# Patient Record
Sex: Female | Born: 1985 | Race: White | Hispanic: No | Marital: Single | Smoking: Never smoker
Health system: Southern US, Community
[De-identification: ages and names within clinical notes are randomized; demographics above are authoritative.]

## PROBLEM LIST (undated history)

## (undated) DIAGNOSIS — F329 Major depressive disorder, single episode, unspecified: Secondary | ICD-10-CM

## (undated) DIAGNOSIS — F419 Anxiety disorder, unspecified: Secondary | ICD-10-CM

## (undated) DIAGNOSIS — H6690 Otitis media, unspecified, unspecified ear: Secondary | ICD-10-CM

## (undated) DIAGNOSIS — F32A Depression, unspecified: Secondary | ICD-10-CM

## (undated) DIAGNOSIS — I82409 Acute embolism and thrombosis of unspecified deep veins of unspecified lower extremity: Secondary | ICD-10-CM

## (undated) HISTORY — DX: Major depressive disorder, single episode, unspecified: F32.9

## (undated) HISTORY — PX: VEIN SURGERY: SHX48

## (undated) HISTORY — PX: CHOLECYSTECTOMY: SHX55

## (undated) HISTORY — DX: Anxiety disorder, unspecified: F41.9

## (undated) HISTORY — DX: Depression, unspecified: F32.A

---

## 2012-10-18 DIAGNOSIS — H8109 Meniere's disease, unspecified ear: Secondary | ICD-10-CM | POA: Insufficient documentation

## 2012-10-18 DIAGNOSIS — G43909 Migraine, unspecified, not intractable, without status migrainosus: Secondary | ICD-10-CM | POA: Insufficient documentation

## 2012-12-29 DIAGNOSIS — R202 Paresthesia of skin: Secondary | ICD-10-CM | POA: Insufficient documentation

## 2013-07-01 DIAGNOSIS — Z86718 Personal history of other venous thrombosis and embolism: Secondary | ICD-10-CM | POA: Insufficient documentation

## 2013-08-24 DIAGNOSIS — M76829 Posterior tibial tendinitis, unspecified leg: Secondary | ICD-10-CM | POA: Insufficient documentation

## 2014-03-14 DIAGNOSIS — R109 Unspecified abdominal pain: Secondary | ICD-10-CM | POA: Insufficient documentation

## 2014-03-14 DIAGNOSIS — K219 Gastro-esophageal reflux disease without esophagitis: Secondary | ICD-10-CM | POA: Insufficient documentation

## 2014-03-14 DIAGNOSIS — R197 Diarrhea, unspecified: Secondary | ICD-10-CM | POA: Insufficient documentation

## 2014-09-19 DIAGNOSIS — D485 Neoplasm of uncertain behavior of skin: Secondary | ICD-10-CM | POA: Insufficient documentation

## 2014-10-11 DIAGNOSIS — M7989 Other specified soft tissue disorders: Secondary | ICD-10-CM | POA: Insufficient documentation

## 2014-12-15 ENCOUNTER — Emergency Department: Payer: Self-pay | Admitting: Emergency Medicine

## 2014-12-15 LAB — CBC WITH DIFFERENTIAL/PLATELET
Basophil #: 0.1 10*3/uL (ref 0.0–0.1)
Basophil %: 1.1 %
Eosinophil #: 0.3 10*3/uL (ref 0.0–0.7)
Eosinophil %: 3.2 %
HCT: 40.1 % (ref 35.0–47.0)
HGB: 13.1 g/dL (ref 12.0–16.0)
Lymphocyte #: 3.2 10*3/uL (ref 1.0–3.6)
Lymphocyte %: 39.2 %
MCH: 28 pg (ref 26.0–34.0)
MCHC: 32.7 g/dL (ref 32.0–36.0)
MCV: 86 fL (ref 80–100)
Monocyte #: 0.7 x10 3/mm (ref 0.2–0.9)
Monocyte %: 8.9 %
Neutrophil #: 3.8 10*3/uL (ref 1.4–6.5)
Neutrophil %: 47.6 %
Platelet: 220 10*3/uL (ref 150–440)
RBC: 4.69 10*6/uL (ref 3.80–5.20)
RDW: 13.1 % (ref 11.5–14.5)
WBC: 8.1 10*3/uL (ref 3.6–11.0)

## 2014-12-15 LAB — COMPREHENSIVE METABOLIC PANEL
Albumin: 3.6 g/dL (ref 3.4–5.0)
Alkaline Phosphatase: 70 U/L
Anion Gap: 5 — ABNORMAL LOW (ref 7–16)
BUN: 12 mg/dL (ref 7–18)
Bilirubin,Total: 0.2 mg/dL (ref 0.2–1.0)
Calcium, Total: 9 mg/dL (ref 8.5–10.1)
Chloride: 106 mmol/L (ref 98–107)
Co2: 29 mmol/L (ref 21–32)
Creatinine: 0.94 mg/dL (ref 0.60–1.30)
EGFR (African American): >60
EGFR (Non-African Amer.): >60
Glucose: 85 mg/dL (ref 65–99)
Osmolality: 278 (ref 275–301)
Potassium: 3.6 mmol/L (ref 3.5–5.1)
SGOT(AST): 12 U/L — ABNORMAL LOW (ref 15–37)
SGPT (ALT): 24 U/L
Sodium: 140 mmol/L (ref 136–145)
Total Protein: 7.3 g/dL (ref 6.4–8.2)

## 2014-12-15 LAB — D-DIMER(ARMC): D-Dimer: 444 ng/ml

## 2015-03-08 DIAGNOSIS — R9431 Abnormal electrocardiogram [ECG] [EKG]: Secondary | ICD-10-CM | POA: Insufficient documentation

## 2015-03-08 DIAGNOSIS — R0789 Other chest pain: Secondary | ICD-10-CM | POA: Insufficient documentation

## 2015-03-11 ENCOUNTER — Ambulatory Visit: Payer: Self-pay | Admitting: Emergency Medicine

## 2015-07-14 ENCOUNTER — Ambulatory Visit
Admission: EM | Admit: 2015-07-14 | Discharge: 2015-07-14 | Disposition: A | Discharge status: Home / Self Care | Payer: Self-pay | Attending: Internal Medicine | Admitting: Internal Medicine

## 2015-07-14 DIAGNOSIS — N390 Urinary tract infection, site not specified: Secondary | ICD-10-CM | POA: Insufficient documentation

## 2015-07-14 DIAGNOSIS — R300 Dysuria: Secondary | ICD-10-CM | POA: Insufficient documentation

## 2015-07-14 DIAGNOSIS — R102 Pelvic and perineal pain: Secondary | ICD-10-CM | POA: Insufficient documentation

## 2015-07-14 LAB — URINALYSIS COMPLETE WITH MICROSCOPIC (ARMC ONLY)
Bilirubin Urine: NEGATIVE
Glucose, UA: NEGATIVE mg/dL
Hgb urine dipstick: NEGATIVE
Ketones, ur: NEGATIVE mg/dL
Leukocytes, UA: NEGATIVE
Nitrite: POSITIVE — AB
Protein, ur: NEGATIVE mg/dL
RBC / HPF: NONE SEEN RBC/hpf (ref <3)
Specific Gravity, Urine: 1.01 (ref 1.005–1.030)
WBC, UA: NONE SEEN WBC/hpf (ref <3)
pH: 6 (ref 5.0–8.0)

## 2015-07-14 MED ORDER — NITROFURANTOIN MONOHYD MACRO 100 MG PO CAPS: 100.0000 mg | ORAL_CAPSULE | Freq: Two times a day (BID) | ORAL | Status: DC

## 2015-07-14 NOTE — ED Provider Notes (Signed)
CSN: [id number]     Arrival date & time 07/14/15  1131 History   First MD Initiated Contact with Patient 07/14/15 1349     Chief Complaint  Patient presents with  . Urinary Frequency    Pt reports UTI sx starting Thursday. Frequency, burning with urination and pain in low pelvis. Pain 6/10   HPI   Patient is a 29 year old lady who presents today with urinary frequency, dysuria, started about 2 days ago. Hesitancy, small volumes. She has low suprapubic discomfort. Mild nausea yesterday, no vomiting. No fever, no unusual vaginal discharge or bleeding. Loose stool today.  History reviewed. No pertinent past medical history. History reviewed. No pertinent past surgical history. History reviewed. No pertinent family history. History  Substance Use Topics  . Smoking status: Never Smoker   . Smokeless tobacco: Not on file  . Alcohol Use: No    Review of Systems  All other systems reviewed and are negative.   Allergies  Sulfa antibiotics  Home Medications   Prior to Admission medications   Medication Sig Start Date End Date Taking? Authorizing Provider  medroxyPROGESTERone (DEPO-PROVERA) 150 MG/ML injection Inject 150 mg into the muscle every 3 (three) months. Last injection June 2015   Yes Historical Provider, MD          BP 119/60 mmHg  Pulse 65  Temp(Src) 97.9 F (36.6 C) (Oral)  Resp 18  Ht 5\' 5"  (1.651 m)  Wt 297 lb (134.718 kg)  BMI 49.42 kg/m2  SpO2 100% Physical Exam  Constitutional: She is oriented to person, place, and time. No distress.  Alert, nicely groomed  HENT:  Head: Atraumatic.  Eyes:  Conjugate gaze, no eye redness/drainage  Neck: Neck supple.  Cardiovascular: Normal rate.   Pulmonary/Chest: No respiratory distress.  Abdominal: She exhibits no distension.  Musculoskeletal: Normal range of motion.  No leg swelling  Neurological: She is alert and oriented to person, place, and time.  Skin: Skin is warm and dry.  No cyanosis  Nursing note and vitals  reviewed.   ED Course  Procedures  Labs Review Labs Reviewed  URINALYSIS COMPLETEWITH MICROSCOPIC ([facility] ONLY) - Abnormal; Notable for the following:    Color, Urine ORANGE (*)    Nitrite POSITIVE (*)    Bacteria, UA FEW (*)    Squamous Epithelial / LPF 6-30 (*)    All other components within normal limits  URINE CULTURE:  pending    MDM   1. Dysuria   2. UTI (lower urinary tract infection)    rx macrobid sent to pharmacy. Recheck for persistent/worsening sx's.    Sherlene Shams, MD 07/14/15 [phone]

## 2015-07-14 NOTE — Discharge Instructions (Signed)
Urinalysis suggests urinary tract infection; a culture is pending. Prescription for macrobid (antibiotic) was sent to [facility] in [place].

## 2015-07-17 LAB — URINE CULTURE

## 2015-08-28 ENCOUNTER — Encounter: Payer: Self-pay | Admitting: Emergency Medicine

## 2015-08-28 ENCOUNTER — Ambulatory Visit
Admit: 2015-08-28 | Discharge: 2015-08-28 | Disposition: A | Discharge status: Home / Self Care | Payer: BLUE CROSS/BLUE SHIELD | Attending: Family Medicine | Admitting: Family Medicine

## 2015-08-28 ENCOUNTER — Ambulatory Visit
Admission: EM | Admit: 2015-08-28 | Discharge: 2015-08-28 | Disposition: A | Discharge status: Home / Self Care | Payer: BLUE CROSS/BLUE SHIELD | Attending: Family Medicine | Admitting: Family Medicine

## 2015-08-28 DIAGNOSIS — M79661 Pain in right lower leg: Secondary | ICD-10-CM | POA: Diagnosis not present

## 2015-08-28 HISTORY — DX: Acute embolism and thrombosis of unspecified deep veins of unspecified lower extremity: I82.409

## 2015-08-28 NOTE — ED Notes (Signed)
Patient will return at 3:45pm for her Doppler at the [facility]. Patient verbalized understanding.

## 2015-08-28 NOTE — ED Provider Notes (Signed)
CSN: [id number]     Arrival date & time 08/28/15  1234 History   First MD Initiated Contact with Patient 08/28/15 1317     Chief Complaint  Patient presents with  . Leg Pain   (Consider location/radiation/quality/duration/timing/severity/associated sxs/prior Treatment) HPI Comments: 29 yo female with a 6 day h/o right calf pain that's not improving. Patient states she also noticed some swelling. Denies any injuries. States about 6 years ago she had a DVT in same leg which she was told was due to her obesity and contraceptive pills. Denies any recent surgeries or prolonged immobilization. Denies fevers, chills, chest pains or shortness of breath.   The history is provided by the patient.    Past Medical History  Diagnosis Date  . DVT (deep venous thrombosis)    Past Surgical History  Procedure Laterality Date  . Cholecystectomy     History reviewed. No pertinent family history. Social History  Substance Use Topics  . Smoking status: Never Smoker   . Smokeless tobacco: None  . Alcohol Use: No   OB History    No data available     Review of Systems  Allergies  Sulfa antibiotics  Home Medications   Prior to Admission medications   Medication Sig Start Date End Date Taking? Authorizing Provider  medroxyPROGESTERone (DEPO-PROVERA) 150 MG/ML injection Inject 150 mg into the muscle every 3 (three) months. Last injection June 2015    Historical Provider, MD  nitrofurantoin, macrocrystal-monohydrate, (MACROBID) 100 MG capsule Take 1 capsule (100 mg total) by mouth 2 (two) times daily. 07/14/15   Sherlene Shams, MD   Meds Ordered and Administered this Visit  Medications - No data to display  BP 150/76 mmHg  Pulse 71  Temp(Src) 98.7 F (37.1 C) (Tympanic)  Resp 16  Ht 5\' 5"  (1.651 m)  Wt 295 lb (133.811 kg)  BMI 49.09 kg/m2  SpO2 100% No data found.   Physical Exam  Constitutional: She appears well-developed and well-nourished. No distress.  Cardiovascular: Normal rate.    Pulmonary/Chest: Effort normal. No respiratory distress.  Musculoskeletal:  Right calf tenderness to palpation; right calf: 22in; left calf:20in; skin intact; no erythema; extremity neurovascularly intact  Skin: She is not diaphoretic.  Nursing note and vitals reviewed.   ED Course  Procedures (including critical care time)  Labs Review Labs Reviewed - No data to display  Imaging Review No results found.   Visual Acuity Review  Right Eye Distance:   Left Eye Distance:   Bilateral Distance:    Right Eye Near:   Left Eye Near:    Bilateral Near:         MDM   1. Right calf pain    Plan: 1.  Possible etiologies reviewed with patient 2.  Recommend right LE doppler US to rule out DVT (will have done today at 4pm) 3. Further management pending [place] result; (discussed with patient, if negative treat as strain with otc analgesics, ice, stretches)  Norval Gable, MD 08/28/15 1429

## 2015-08-28 NOTE — ED Notes (Signed)
US Doppler of the right lower leg scheduled for 4:00pm at the [facility] for today.

## 2015-08-28 NOTE — ED Notes (Signed)
Patient c/o pain in her right leg for a week.  Patient denies recent injury.

## 2015-08-28 NOTE — ED Provider Notes (Signed)
CSN: [id number]     Arrival date & time 08/28/15  1234 History   First MD Initiated Contact with Patient 08/28/15 1317     Chief Complaint  Patient presents with  . Leg Pain   (Consider location/radiation/quality/duration/timing/severity/associated sxs/prior Treatment) HPI  Past Medical History  Diagnosis Date  . DVT (deep venous thrombosis)    Past Surgical History  Procedure Laterality Date  . Cholecystectomy     History reviewed. No pertinent family history. Social History  Substance Use Topics  . Smoking status: Never Smoker   . Smokeless tobacco: None  . Alcohol Use: No   OB History    No data available     Review of Systems  Allergies  Sulfa antibiotics  Home Medications   Prior to Admission medications   Medication Sig Start Date End Date Taking? Authorizing Provider  medroxyPROGESTERone (DEPO-PROVERA) 150 MG/ML injection Inject 150 mg into the muscle every 3 (three) months. Last injection June 2015    Historical Provider, MD  nitrofurantoin, macrocrystal-monohydrate, (MACROBID) 100 MG capsule Take 1 capsule (100 mg total) by mouth 2 (two) times daily. 07/14/15   Sherlene Shams, MD   Meds Ordered and Administered this Visit  Medications - No data to display  BP 150/76 mmHg  Pulse 71  Temp(Src) 98.7 F (37.1 C) (Tympanic)  Resp 16  Ht 5\' 5"  (1.651 m)  Wt 295 lb (133.811 kg)  BMI 49.09 kg/m2  SpO2 100% No data found.   Physical Exam  ED Course  Procedures (including critical care time)  Labs Review Labs Reviewed - No data to display  Imaging Review US Venous Img Lower Unilateral Right  08/28/2015   CLINICAL DATA:  Right leg and calf pain for 5 days.  EXAM: RIGHT LOWER EXTREMITY VENOUS DOPPLER ULTRASOUND  TECHNIQUE: Gray-scale sonography with graded compression, as well as color Doppler and duplex ultrasound, were performed to evaluate the deep venous system from the level of the common femoral vein through the popliteal and proximal calf veins.  Spectral Doppler was utilized to evaluate flow at rest and with distal augmentation maneuvers.  COMPARISON:  None.  FINDINGS: Left common femoral vein is patent.  Normal compressibility, augmentation and color Doppler flow in the right common femoral vein, right femoral vein and right popliteal vein. The right saphenofemoral junction is patent. Visualized right deep calf veins are patent. Study was technically challenging due to body habitus.  IMPRESSION: Negative for deep vein thrombosis in the right lower extremity.   Electronically Signed   By: Markus Daft M.D.   On: 08/28/2015 16:57     Visual Acuity Review  Right Eye Distance:   Left Eye Distance:   Bilateral Distance:    Right Eye Near:   Left Eye Near:    Bilateral Near:         MDM   1. Right calf pain      Patient was informed by Dr. Alveta Heimlich of her ultrasound results. At this time those no signs of any DVTs. She has questions far as the pain being caused by the lymphedema that she's had but I really wasn't able to answer that. I did suggest may try some Motrin over-the-counter and follow-up with PCP to see whether she needs a stronger anti-inflammatory, diaphoretic, or referral to vascular surgery    Frederich Cha, MD 08/28/15 2204

## 2015-10-06 ENCOUNTER — Ambulatory Visit
Admission: EM | Admit: 2015-10-06 | Discharge: 2015-10-06 | Disposition: A | Discharge status: Home / Self Care | Payer: BLUE CROSS/BLUE SHIELD | Attending: Family Medicine | Admitting: Family Medicine

## 2015-10-06 DIAGNOSIS — H8110 Benign paroxysmal vertigo, unspecified ear: Secondary | ICD-10-CM

## 2015-10-06 MED ORDER — LORAZEPAM 1 MG PO TABS: 1.0000 mg | ORAL_TABLET | Freq: Three times a day (TID) | ORAL | Status: DC | PRN

## 2015-10-06 MED ORDER — ONDANSETRON 8 MG PO TBDP
8.0000 mg | ORAL_TABLET | Freq: Once | ORAL | Status: AC
  Administered 2015-10-06: 8 mg via ORAL

## 2015-10-06 NOTE — ED Provider Notes (Signed)
CSN: [id number]     Arrival date & time 10/06/15  1428 History   First MD Initiated Contact with Patient 10/06/15 1546     Chief Complaint  Patient presents with  . Dizziness   (Consider location/radiation/quality/duration/timing/severity/associated sxs/prior Treatment) HPI Comments: 29 yo female with a  2 days h/o vertigo. Today has felt nausea as well and right ear fullness. Tried otc meclizine without relief. States has a h/o prior vertigo. Denies any fevers, chills, nasal congestion, cough.   The history is provided by the patient.    Past Medical History  Diagnosis Date  . DVT (deep venous thrombosis) [facility])    Past Surgical History  Procedure Laterality Date  . Cholecystectomy     No family history on file. Social History  Substance Use Topics  . Smoking status: Never Smoker   . Smokeless tobacco: None  . Alcohol Use: No   OB History    No data available     Review of Systems  Allergies  Sulfa antibiotics  Home Medications   Prior to Admission medications   Medication Sig Start Date End Date Taking? Authorizing Provider  medroxyPROGESTERone (DEPO-PROVERA) 150 MG/ML injection Inject 150 mg into the muscle every 3 (three) months. Last injection June 2015   Yes Historical Provider, MD  omeprazole (PRILOSEC) 40 MG capsule Take 40 mg by mouth daily.   Yes Historical Provider, MD  nitrofurantoin, macrocrystal-monohydrate, (MACROBID) 100 MG capsule Take 1 capsule (100 mg total) by mouth 2 (two) times daily. 07/14/15   Sherlene Shams, MD   Meds Ordered and Administered this Visit   Medications  ondansetron (ZOFRAN-ODT) disintegrating tablet 8 mg (8 mg Oral Given 10/06/15 1559)    BP 124/69 mmHg  Pulse 79  Temp(Src) 99.2 F (37.3 C) (Tympanic)  Resp 15  Ht 5\' 5"  (1.651 m)  Wt 295 lb (133.811 kg)  BMI 49.09 kg/m2  SpO2 100% No data found.   Physical Exam  Constitutional: She is oriented to person, place, and time. She appears well-developed and  well-nourished. No distress.  HENT:  Head: Normocephalic and atraumatic.  Right Ear: External ear normal.  Left Ear: External ear normal.  Nose: Nose normal.  Mouth/Throat: Oropharynx is clear and moist. No oropharyngeal exudate.  Eyes: Conjunctivae and EOM are normal. Pupils are equal, round, and reactive to light. Right eye exhibits no discharge. Left eye exhibits no discharge. No scleral icterus.  Neck: Neck supple. No tracheal deviation present.  Neurological: She is alert and oriented to person, place, and time. She has normal reflexes. She displays normal reflexes. No cranial nerve deficit. Coordination normal.  Positive Hallpike maneuver test  Skin: She is not diaphoretic.  Nursing note and vitals reviewed.   ED Course  Procedures (including critical care time)  Labs Review Labs Reviewed - No data to display  Imaging Review No results found.   Visual Acuity Review  Right Eye Distance:   Left Eye Distance:   Bilateral Distance:    Right Eye Near:   Left Eye Near:    Bilateral Near:         MDM   1. Benign positional vertigo, unspecified laterality    1. diagnosis reviewed with patient/parent 2. Recommend Epley maneuver, vestibular exercises 3. Patient given zofran 8mg  po x1 in clinic with improvement of nausea 4. rx as per orders above; reviewed possible side effects, interactions, risks and benefits; patient given rx for lorazepam 1mg  po q 8 hours prn #6 tabs 5. Recommend supportive treatment with  otc anti-histamines; 6. Follow prn if symptoms worsen or don't improve    Norval Gable, MD 10/06/15 1645

## 2015-10-06 NOTE — ED Notes (Signed)
Patient states that she has a history of vertigo. She states that her symptoms started yesterday, She states that she feels like she is spinning. Patient states that she is also having a slight headache, that she think is caused by the vertigo. Patient also reports some nausea.

## 2015-10-06 NOTE — Discharge Instructions (Signed)
Benign Positional Vertigo Vertigo is the feeling that you or your surroundings are moving when they are not. Benign positional vertigo is the most common form of vertigo. The cause of this condition is not serious (is benign). This condition is triggered by certain movements and positions (is positional). This condition can be dangerous if it occurs while you are doing something that could endanger you or others, such as driving.  CAUSES In many cases, the cause of this condition is not known. It may be caused by a disturbance in an area of the inner ear that helps your brain to sense movement and balance. This disturbance can be caused by a viral infection (labyrinthitis), head injury, or repetitive motion. RISK FACTORS This condition is more likely to develop in: 1. Women. 2. People who are 31 years of age or older. SYMPTOMS Symptoms of this condition usually happen when you move your head or your eyes in different directions. Symptoms may start suddenly, and they usually last for less than a minute. Symptoms may include:  Loss of balance and falling.  Feeling like you are spinning or moving.  Feeling like your surroundings are spinning or moving.  Nausea and vomiting.  Blurred vision.  Dizziness.  Involuntary eye movement (nystagmus). Symptoms can be mild and cause only slight annoyance, or they can be severe and interfere with daily life. Episodes of benign positional vertigo may return (recur) over time, and they may be triggered by certain movements. Symptoms may improve over time. DIAGNOSIS This condition is usually diagnosed by medical history and a physical exam of the head, neck, and ears. You may be referred to a health care provider who specializes in ear, nose, and throat (ENT) problems (otolaryngologist) or a provider who specializes in disorders of the nervous system (neurologist). You may have additional testing, including:  MRI.  A CT scan.  Eye movement tests. Your  health care provider may ask you to change positions quickly while he or she watches you for symptoms of benign positional vertigo, such as nystagmus. Eye movement may be tested with an electronystagmogram (ENG), caloric stimulation, the Dix-Hallpike test, or the roll test.  An electroencephalogram (EEG). This records electrical activity in your brain.  Hearing tests. TREATMENT Usually, your health care provider will treat this by moving your head in specific positions to adjust your inner ear back to normal. Surgery may be needed in severe cases, but this is rare. In some cases, benign positional vertigo may resolve on its own in 2-4 weeks. HOME CARE INSTRUCTIONS Safety  Move slowly.Avoid sudden body or head movements.  Avoid driving.  Avoid operating heavy machinery.  Avoid doing any tasks that would be dangerous to you or others if a vertigo episode would occur.  If you have trouble walking or keeping your balance, try using a cane for stability. If you feel dizzy or unstable, sit down right away.  Return to your normal activities as told by your health care provider. Ask your health care provider what activities are safe for you. General Instructions  Take over-the-counter and prescription medicines only as told by your health care provider.  Avoid certain positions or movements as told by your health care provider.  Drink enough fluid to keep your urine clear or pale yellow.  Keep all follow-up visits as told by your health care provider. This is important. SEEK MEDICAL CARE IF:  You have a fever.  Your condition gets worse or you develop new symptoms.  Your family or friends  notice any behavioral changes.  Your nausea or vomiting gets worse.  You have numbness or a "pins and needles" sensation. SEEK IMMEDIATE MEDICAL CARE IF:  You have difficulty speaking or moving.  You are always dizzy.  You faint.  You develop severe headaches.  You have weakness in your  legs or arms.  You have changes in your hearing or vision.  You develop a stiff neck.  You develop sensitivity to light.   This information is not intended to replace advice given to you by your health care provider. Make sure you discuss any questions you have with your health care provider.   Document Released: 09/15/2006 Document Revised: 08/29/2015 Document Reviewed: 04/02/2015 Elsevier Interactive Patient Education 2016 [employer].  [employer] Self-Care WHAT IS THE EPLEY MANEUVER? The Epley maneuver is an exercise you can do to relieve symptoms of benign paroxysmal positional vertigo (BPPV). This condition is often just referred to as vertigo. BPPV is caused by the movement of tiny crystals (canaliths) inside your inner ear. The accumulation and movement of canaliths in your inner ear causes a sudden spinning sensation (vertigo) when you move your head to certain positions. Vertigo usually lasts about 30 seconds. BPPV usually occurs in just one ear. If you get vertigo when you lie on your left side, you probably have BPPV in your left ear. Your health care provider can tell you which ear is involved.  BPPV may be caused by a head injury. Many people older than 50 get BPPV for unknown reasons. If you have been diagnosed with BPPV, your health care provider may teach you how to do this maneuver. BPPV is not life threatening (benign) and usually goes away in time.  WHEN SHOULD I PERFORM THE EPLEY MANEUVER? You can do this maneuver at home whenever you have symptoms of vertigo. You may do the Epley maneuver up to 3 times a day until your symptoms of vertigo go away. HOW SHOULD I DO THE EPLEY MANEUVER? 3. Sit on the edge of a bed or table with your back straight. Your legs should be extended or hanging over the edge of the bed or table.  4. Turn your head halfway toward the affected ear.  5. Lie backward quickly with your head turned until you are lying flat on your back. You may want  to position a pillow under your shoulders.  6. Hold this position for 30 seconds. You may experience an attack of vertigo. This is normal. Hold this position until the vertigo stops. 7. Then turn your head to the opposite direction until your unaffected ear is facing the floor.  8. Hold this position for 30 seconds. You may experience an attack of vertigo. This is normal. Hold this position until the vertigo stops. 9. Now turn your whole body to the same side as your head. Hold for another 30 seconds.  10. You can then sit back up. ARE THERE RISKS TO THIS MANEUVER? In some cases, you may have other symptoms (such as changes in your vision, weakness, or numbness). If you have these symptoms, stop doing the maneuver and call your health care provider. Even if doing these maneuvers relieves your vertigo, you may still have dizziness. Dizziness is the sensation of light-headedness but without the sensation of movement. Even though the Epley maneuver may relieve your vertigo, it is possible that your symptoms will return within 5 years. WHAT SHOULD I DO AFTER THIS MANEUVER? After doing the Epley maneuver, you can return to your  normal activities. Ask your doctor if there is anything you should do at home to prevent vertigo. This may include:  Sleeping with two or more pillows to keep your head elevated.  Not sleeping on the side of your affected ear.  Getting up slowly from bed.  Avoiding sudden movements during the day.  Avoiding extreme head movement, like looking up or bending over.  Wearing a cervical collar to prevent sudden head movements. WHAT SHOULD I DO IF MY SYMPTOMS GET WORSE? Call your health care provider if your vertigo gets worse. Call your provider right way if you have other symptoms, including:   Nausea.  Vomiting.  Headache.  Weakness.  Numbness.  Vision changes.   This information is not intended to replace advice given to you by your health care provider. Make  sure you discuss any questions you have with your health care provider.   Document Released: 12/13/2013 Document Reviewed: 12/13/2013 Elsevier Interactive Patient Education [employer].

## 2015-10-14 IMAGING — US US EXTREM LOW VENOUS*R*
1 series · 14 of 24 positions shown · non-contrast
Comparison: None.

CLINICAL DATA: Right leg and calf pain for 5 days.

EXAM:
RIGHT LOWER EXTREMITY VENOUS DOPPLER ULTRASOUND
TECHNIQUE: Gray-scale sonography with graded compression, as well as color
Doppler and duplex ultrasound, were performed to evaluate the deep
venous system from the level of the common femoral vein through the
popliteal and proximal calf veins. Spectral Doppler was utilized to
evaluate flow at rest and with distal augmentation maneuvers.

[Series 1: us extrem low venous*right* · 0.12mm/px · 14 of 31 slices shown]
[im 1/31]
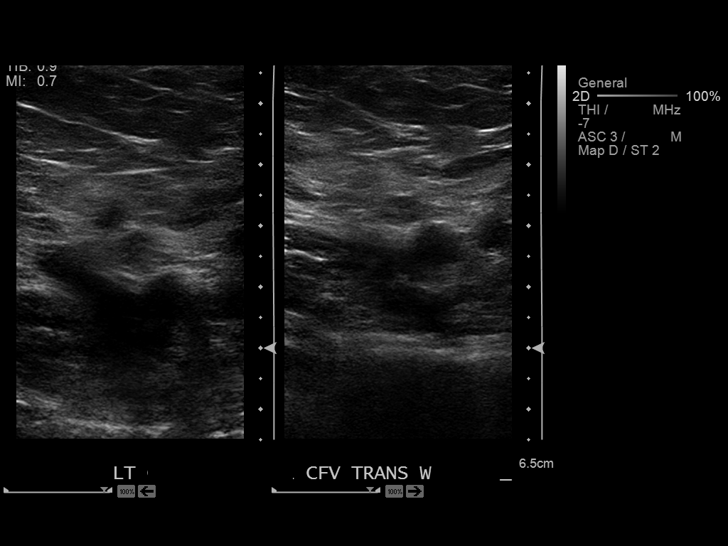
[im 3/31]
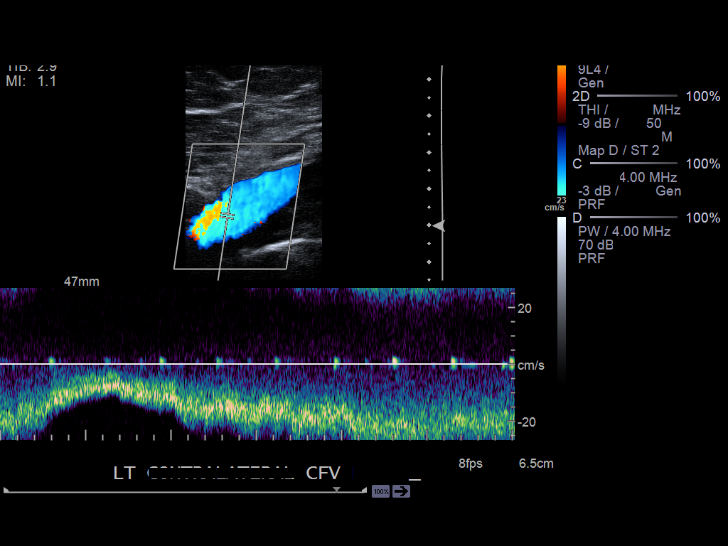
[im 6/31]
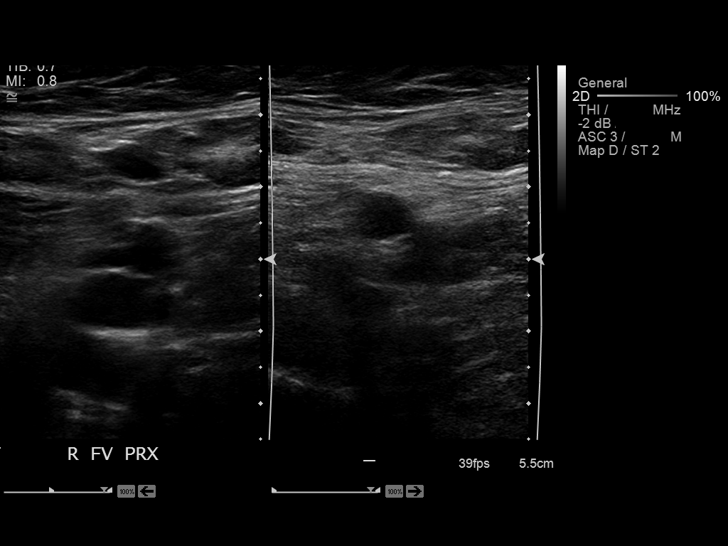
[im 8/31]
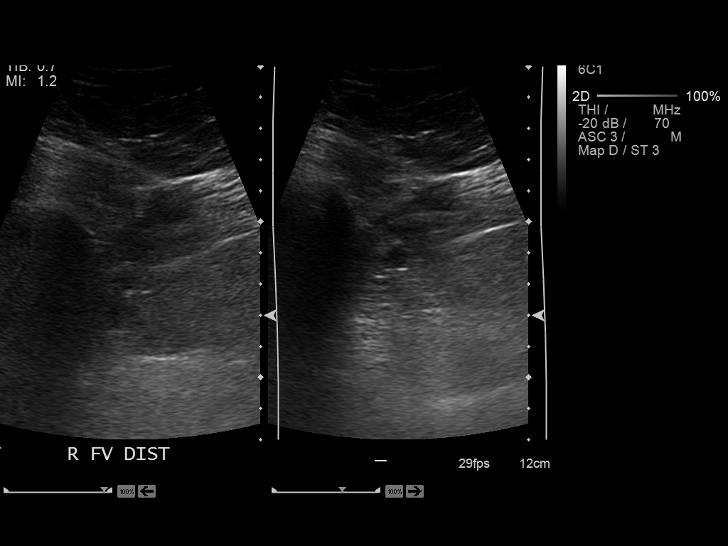
[im 10/31]
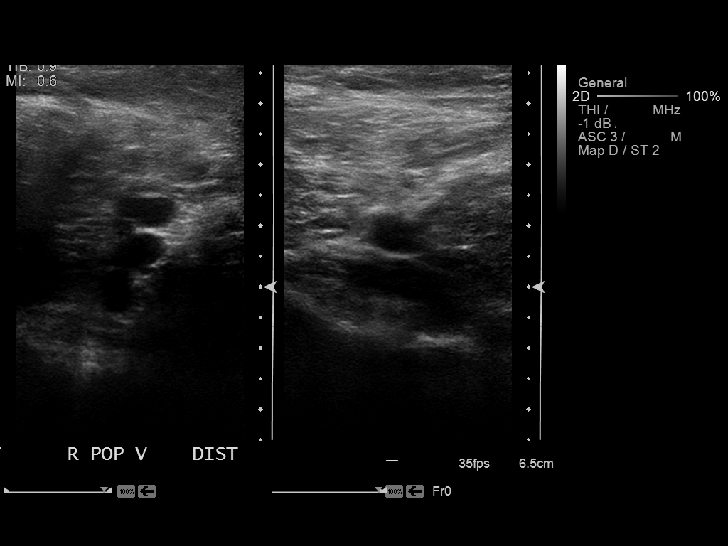
[im 12/31]
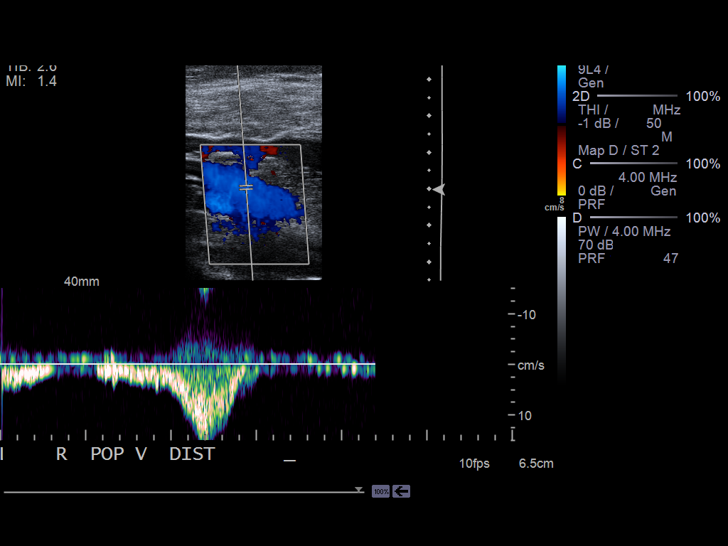
[im 15/31]
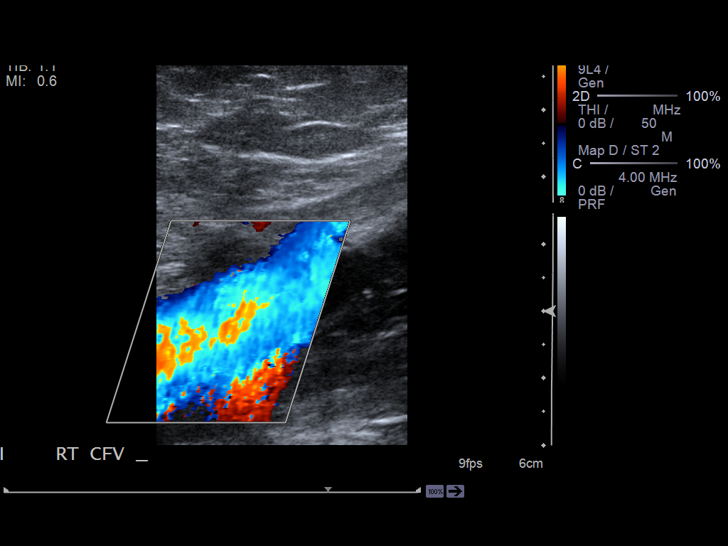
[im 16/31]
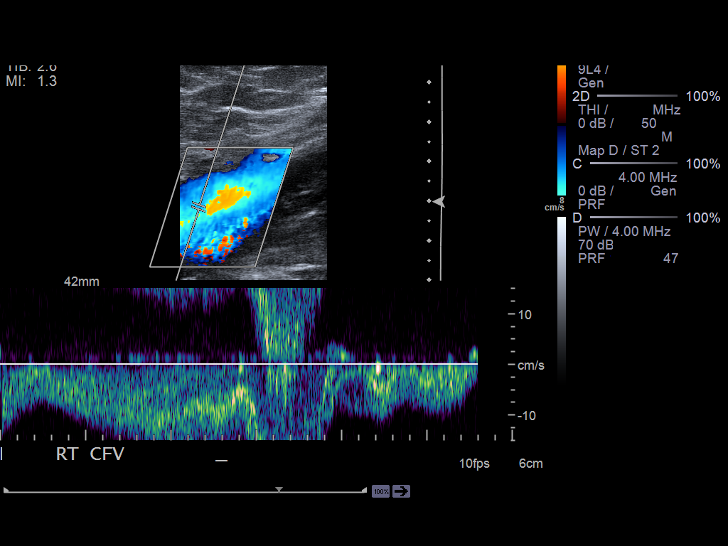
[im 19/31]
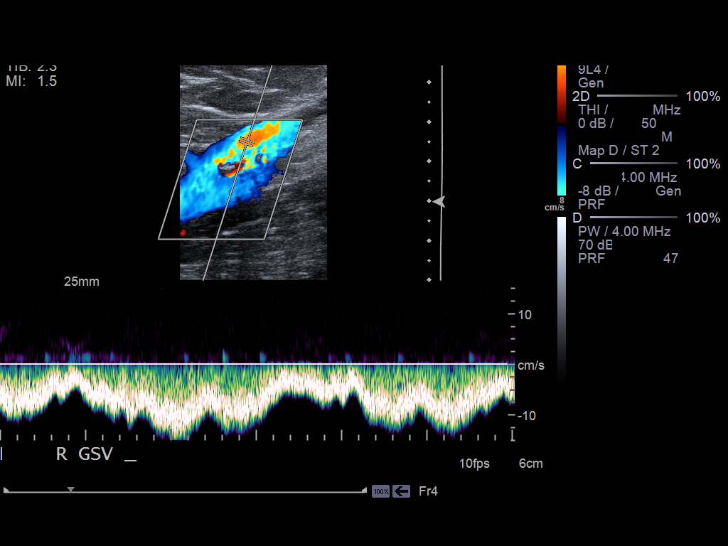
[im 21/31]
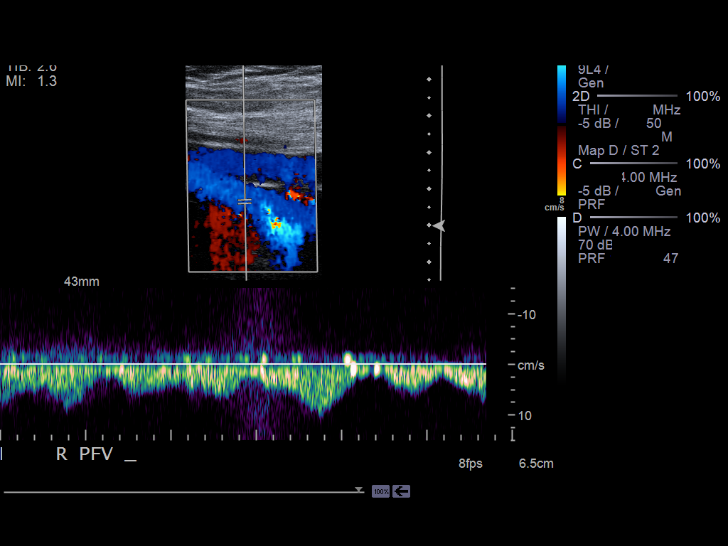
[im 24/31]
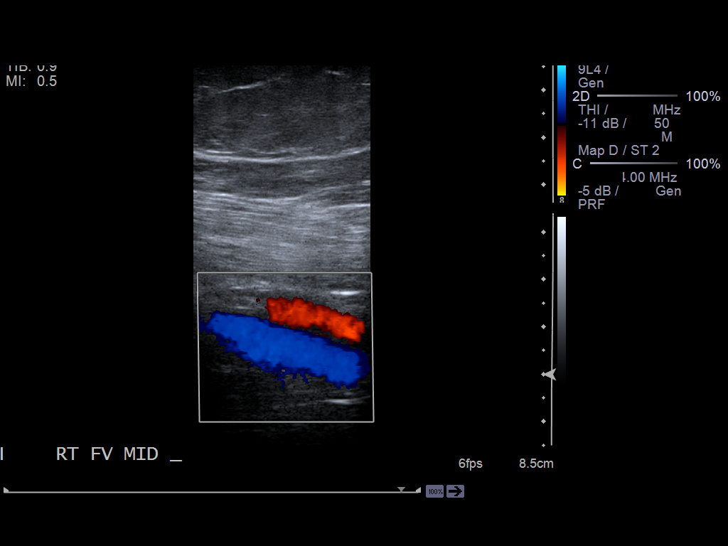
[im 25/31]
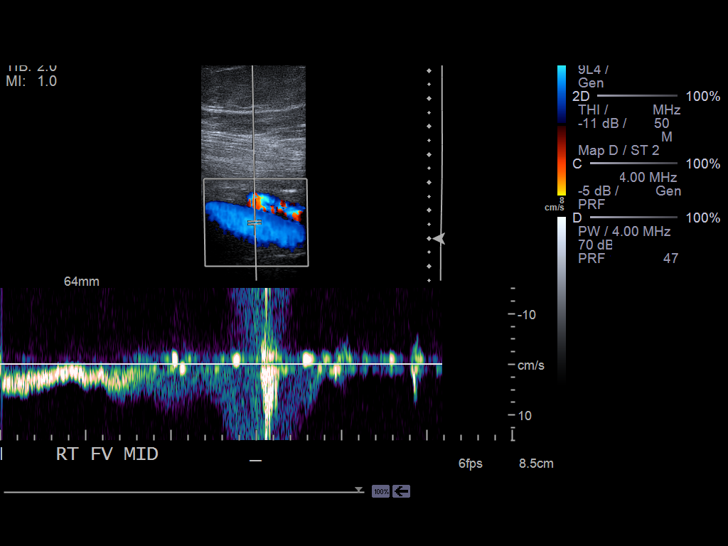
[im 28/31]
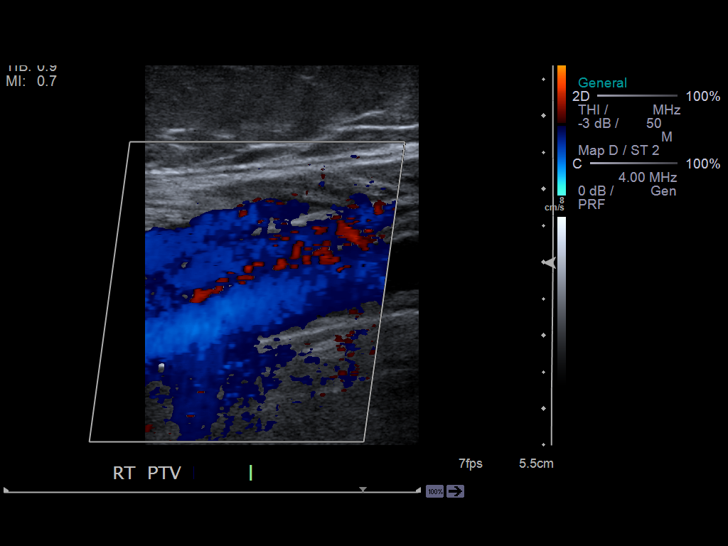
[im 31/31]
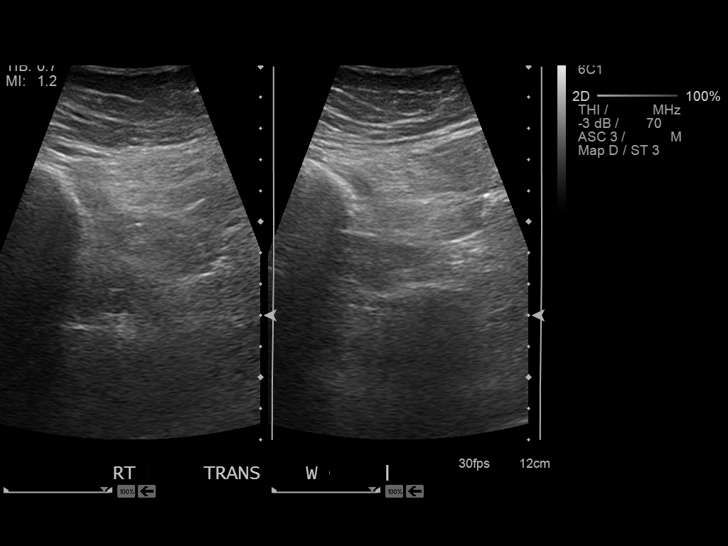

[14 of 24 positions shown; findings below may reference images not displayed]

FINDINGS: Left common femoral vein is patent.

Normal compressibility, augmentation and color Doppler flow in the
right common femoral vein, right femoral vein and right popliteal
vein. The right saphenofemoral junction is patent. Visualized right
deep calf veins are patent. Study was technically challenging due to
body habitus.
IMPRESSION: Negative for deep vein thrombosis in the right lower extremity.

## 2015-11-01 DIAGNOSIS — H9319 Tinnitus, unspecified ear: Secondary | ICD-10-CM | POA: Insufficient documentation

## 2015-11-01 DIAGNOSIS — H903 Sensorineural hearing loss, bilateral: Secondary | ICD-10-CM | POA: Insufficient documentation

## 2015-11-01 DIAGNOSIS — K219 Gastro-esophageal reflux disease without esophagitis: Secondary | ICD-10-CM | POA: Insufficient documentation

## 2015-11-01 DIAGNOSIS — E282 Polycystic ovarian syndrome: Secondary | ICD-10-CM | POA: Insufficient documentation

## 2015-11-01 DIAGNOSIS — M7989 Other specified soft tissue disorders: Secondary | ICD-10-CM | POA: Insufficient documentation

## 2015-11-01 DIAGNOSIS — I82511 Chronic embolism and thrombosis of right femoral vein: Secondary | ICD-10-CM | POA: Insufficient documentation

## 2015-11-01 DIAGNOSIS — J301 Allergic rhinitis due to pollen: Secondary | ICD-10-CM | POA: Insufficient documentation

## 2015-12-05 DIAGNOSIS — I872 Venous insufficiency (chronic) (peripheral): Secondary | ICD-10-CM | POA: Insufficient documentation

## 2016-01-23 ENCOUNTER — Other Ambulatory Visit: Payer: Self-pay | Admitting: Neurology

## 2016-01-23 DIAGNOSIS — R42 Dizziness and giddiness: Secondary | ICD-10-CM

## 2016-02-08 ENCOUNTER — Ambulatory Visit
Admission: RE | Admit: 2016-02-08 | Discharge: 2016-02-08 | Disposition: A | Discharge status: Home / Self Care | Payer: BLUE CROSS/BLUE SHIELD | Source: Ambulatory Visit | Attending: Neurology | Admitting: Neurology

## 2016-02-08 DIAGNOSIS — R42 Dizziness and giddiness: Secondary | ICD-10-CM | POA: Insufficient documentation

## 2016-02-08 MED ORDER — GADOBENATE DIMEGLUMINE 529 MG/ML IV SOLN
20.0000 mL | Freq: Once | INTRAVENOUS | Status: AC | PRN
  Administered 2016-02-08: 20 mL via INTRAVENOUS

## 2016-03-24 ENCOUNTER — Encounter: Payer: Self-pay | Admitting: *Deleted

## 2016-03-24 ENCOUNTER — Ambulatory Visit
Admission: EM | Admit: 2016-03-24 | Discharge: 2016-03-24 | Disposition: A | Discharge status: Home / Self Care | Payer: BLUE CROSS/BLUE SHIELD | Attending: Family Medicine | Admitting: Family Medicine

## 2016-03-24 DIAGNOSIS — N61 Mastitis without abscess: Secondary | ICD-10-CM

## 2016-03-24 DIAGNOSIS — N611 Abscess of the breast and nipple: Secondary | ICD-10-CM

## 2016-03-24 DIAGNOSIS — L98499 Non-pressure chronic ulcer of skin of other sites with unspecified severity: Secondary | ICD-10-CM

## 2016-03-24 MED ORDER — CEPHALEXIN 500 MG PO CAPS: 500.0000 mg | ORAL_CAPSULE | Freq: Three times a day (TID) | ORAL | Status: DC

## 2016-03-24 MED ORDER — ONDANSETRON 8 MG PO TBDP: 8.0000 mg | ORAL_TABLET | Freq: Three times a day (TID) | ORAL | Status: DC | PRN

## 2016-03-24 NOTE — ED Provider Notes (Signed)
CSN: [id number]     Arrival date & time 03/24/16  1546 History   First MD Initiated Contact with Patient 03/24/16 1647     Chief Complaint  Patient presents with  . Sore  . Nausea   (Consider location/radiation/quality/duration/timing/severity/associated sxs/prior Treatment) HPI  Past Medical History  Diagnosis Date  . DVT (deep venous thrombosis) [facility])    Past Surgical History  Procedure Laterality Date  . Cholecystectomy     History reviewed. No pertinent family history. Social History  Substance Use Topics  . Smoking status: Never Smoker   . Smokeless tobacco: None  . Alcohol Use: No   OB History    No data available     Review of Systems  Allergies  Sulfa antibiotics  Home Medications   Prior to Admission medications   Medication Sig Start Date End Date Taking? Authorizing Provider  medroxyPROGESTERone (DEPO-PROVERA) 150 MG/ML injection Inject 150 mg into the muscle every 3 (three) months. Last injection June 2015   Yes Historical Provider, MD  omeprazole (PRILOSEC) 40 MG capsule Take 40 mg by mouth daily.   Yes Historical Provider, MD  phentermine 37.5 MG capsule Take 37.5 mg by mouth every morning.   Yes Historical Provider, MD  triamterene-hydrochlorothiazide (DYAZIDE) 37.5-25 MG capsule Take 1 capsule by mouth daily.   Yes Historical Provider, MD  cephALEXin (KEFLEX) 500 MG capsule Take 1 capsule (500 mg total) by mouth 3 (three) times daily. 03/24/16   Norval Gable, MD  LORazepam (ATIVAN) 1 MG tablet Take 1 tablet (1 mg total) by mouth 3 (three) times daily as needed for anxiety. 10/06/15   Lorin Picket, PA-C  nitrofurantoin, macrocrystal-monohydrate, (MACROBID) 100 MG capsule Take 1 capsule (100 mg total) by mouth 2 (two) times daily. 07/14/15   Sherlene Shams, MD  ondansetron (ZOFRAN ODT) 8 MG disintegrating tablet Take 1 tablet (8 mg total) by mouth every 8 (eight) hours as needed for nausea or vomiting. 03/24/16   Norval Gable, MD   Meds Ordered and  Administered this Visit  Medications - No data to display  BP 131/78 mmHg  Pulse 101  Temp(Src) 98 F (36.7 C) (Oral)  Resp 16  Ht 5\' 6"  (1.676 m)  Wt 248 lb (112.492 kg)  BMI 40.05 kg/m2  SpO2 100% No data found.   Physical Exam  Constitutional: She appears well-developed and well-nourished. No distress.  Pulmonary/Chest:    1.5cm superficial skin ulceration on left breast with no drainage; mild surrounding blanchable erythema, warmth and tenderness to palpation  Skin: She is not diaphoretic.  Nursing note and vitals reviewed.   ED Course  Procedures (including critical care time)  Labs Review Labs Reviewed - No data to display  Imaging Review No results found.   Visual Acuity Review  Right Eye Distance:   Left Eye Distance:   Bilateral Distance:    Right Eye Near:   Left Eye Near:    Bilateral Near:         MDM   1. Cellulitis of breast   2. Ulcer of skin of breast     Discharge Medication List as of 03/24/2016  5:27 PM    START taking these medications   Details  cephALEXin (KEFLEX) 500 MG capsule Take 1 capsule (500 mg total) by mouth 3 (three) times daily., Starting 03/24/2016, Until Discontinued, Normal    ondansetron (ZOFRAN ODT) 8 MG disintegrating tablet Take 1 tablet (8 mg total) by mouth every 8 (eight) hours as needed for nausea or  vomiting., Starting 03/24/2016, Until Discontinued, Print       1. diagnosis reviewed with patient 2. rx as per orders above; reviewed possible side effects, interactions, risks and benefits  3. Recommend supportive treatment with warm compresses  4. Follow-up prn if symptoms worsen or don't improve  Norval Gable, MD 03/24/16 1746

## 2016-03-24 NOTE — ED Notes (Signed)
Sore to inner side aspect left breast. No injury, gradual onset over 1 week, draining serous fluid. Pt became nauseous yesterday.

## 2017-01-16 ENCOUNTER — Encounter (HOSPITAL_COMMUNITY): Payer: Self-pay

## 2017-02-18 ENCOUNTER — Ambulatory Visit (INDEPENDENT_AMBULATORY_CARE_PROVIDER_SITE_OTHER): Payer: BLUE CROSS/BLUE SHIELD | Admitting: Psychiatry

## 2017-02-18 ENCOUNTER — Encounter: Payer: Self-pay | Admitting: Psychiatry

## 2017-02-18 VITALS — BP 132/73 | HR 76 | Temp 98.0°F | Wt 252.0 lb

## 2017-02-18 DIAGNOSIS — I8390 Asymptomatic varicose veins of unspecified lower extremity: Secondary | ICD-10-CM | POA: Insufficient documentation

## 2017-02-18 DIAGNOSIS — F411 Generalized anxiety disorder: Secondary | ICD-10-CM

## 2017-02-18 DIAGNOSIS — F330 Major depressive disorder, recurrent, mild: Secondary | ICD-10-CM | POA: Diagnosis not present

## 2017-02-18 DIAGNOSIS — I83819 Varicose veins of unspecified lower extremities with pain: Secondary | ICD-10-CM | POA: Insufficient documentation

## 2017-02-18 MED ORDER — SERTRALINE HCL 25 MG PO TABS: 25.0000 mg | ORAL_TABLET | Freq: Every day | ORAL | 2 refills | Status: DC

## 2017-02-18 NOTE — Progress Notes (Signed)
Psychiatric Initial Adult Assessment   Patient Identification: Evelyn Flores MRN:  [id number] Date of Evaluation:  02/18/2017 Referral Source: self referred Chief Complaint:   Chief Complaint    Establish Care; Anxiety; Depression     Visit Diagnosis:    ICD-9-CM ICD-10-CM   1. 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 GAD (generalized anxiety disorder) 300.02 F41.1     History of Present Illness:  Patient is a 31 year old Caucasian female who states that she was given a list of clinicians and chose this clinic to be treated for her anxiety and mild depression. Patient is not very communicative about her current symptoms and states that in the past she saw Dr. Elmarie Shiley for her anxiety. She is unsure if those medications worked but states that when she was on the Celexa it was effective to some extent. She also took alprazolam in the past and stated that she had to take increasing doses to keep her anxiety under control. States that at the time she was living in [place] and since then she is moved to [place] for her job. She currently lives with her parents in [place] and works in a Armed forces operational officer. 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 Patient reports that in her current job she gets very anxious that she may not be doing the right things. She reports fair sleep and appetite. She does not report much depression. States that she does  feel somewhat depressed but has some trouble concentrating. Denies any suicidal thoughts. Denies any psychotic symptoms. Denies problems with drugs or alcohol. She has seen in a therapist in the past and reports it was somewhat helpful. She has never been hospitalized psychiatrically and denies any suicide attempts.     PHQ-9 of 5  Past Psychiatric History: No psychiatric hospitalizations. Denies any suicide  attempts.  Previous Psychotropic Medications: Yes Celexa, Lorazepam  Substance Abuse History in the last 12 months:  No.  Consequences of Substance Abuse: Negative  Past Medical History:  Past Medical History:  Diagnosis Date  . Anxiety   . Depression   . DVT (deep venous thrombosis) ([place])     Past Surgical History:  Procedure Laterality Date  . CHOLECYSTECTOMY      Family Psychiatric History: Mom had panic episodes.  Family History: History reviewed. No pertinent family history.  Social History:   Social History   Social History  . Marital status: Single    Spouse name: N/A  . Number of children: N/A  . Years of education: N/A   Social History Main Topics  . Smoking status: Never Smoker  . Smokeless tobacco: Never Used  . Alcohol use No  . Drug use: No  . Sexual activity: Yes    Birth control/ protection: Injection, Condom   Other Topics Concern  . None   Social History Narrative  . None    Additional Social History: Lives with her parents in [place].  Allergies:   Allergies  Allergen Reactions  . Influenza Vaccines Other (See Comments)    Very sick and was hospitalized   . Sulfa Antibiotics Swelling  . Topiramate Other (See Comments)    Mental fogginess, tingling, numbness    Metabolic Disorder Labs: No results found for: HGBA1C, MPG No results found for: PROLACTIN No results found for: CHOL, TRIG, HDL, CHOLHDL, VLDL, LDLCALC   Current  Medications: Current Outpatient Prescriptions  Medication Sig Dispense Refill  . potassium chloride SA (KLOR-CON M20) 20 MEQ tablet Take by mouth.    . fluticasone (FLONASE) 50 MCG/ACT nasal spray Place 2 sprays into both nostrils daily.  12  . medroxyPROGESTERone (DEPO-PROVERA) 150 MG/ML injection Inject 150 mg into the muscle every 3 (three) months. Last injection June 2015    . omeprazole (PRILOSEC) 40 MG capsule Take 40 mg by mouth daily.    Marland Kitchen triamterene-hydrochlorothiazide (DYAZIDE) 37.5-25 MG capsule  Take 1 capsule by mouth daily.     No current facility-administered medications for this visit.     Neurologic: Headache: No Seizure: No Paresthesias:No  Musculoskeletal: Strength & Muscle Tone: within normal limits Gait & Station: normal Patient leans: N/A  Psychiatric Specialty Exam: ROS  Blood pressure 132/73, pulse 76, temperature 98 F (36.7 C), temperature source Oral, weight 252 lb (114.3 kg).Body mass index is 40.67 kg/m.  General Appearance: Casual  Eye Contact:  Fair  Speech:  Clear and Coherent  Volume:  Normal  Mood:  Anxious  Affect:  Congruent  Thought Process:  Coherent  Orientation:  Full (Time, Place, and Person)  Thought Content:  Logical  Suicidal Thoughts:  No  Homicidal Thoughts:  No  Memory:  Immediate;   Fair Recent;   Fair Remote;   Fair  Judgement:  Fair  Insight:  Fair  Psychomotor Activity:  Normal  Concentration:  Concentration: Fair and Attention Span: Fair  Recall:  [employer] of Knowledge:Fair  Language: Fair  Akathisia:  No  Handed:  Right  AIMS (if indicated):  na  Assets:  Communication Skills Desire for Improvement Vocational/Educational  ADL's:  Intact  Cognition: WNL  Sleep:  fair    Treatment Plan Summary:  Major Depressive Disorder, mild  Start Zoloft at 25mg  po qd, side effects discussed. Start therapy to address anxiety and mood, develop coping skills. Patient given a list of therapists  Generalized anxiety disorder Same as above  RTC in 1 month.  Elvin So, MD 2/28/20189:00 AM

## 2017-02-24 ENCOUNTER — Ambulatory Visit
Admission: EM | Admit: 2017-02-24 | Discharge: 2017-02-24 | Disposition: A | Discharge status: Home / Self Care | Payer: BLUE CROSS/BLUE SHIELD | Attending: Family Medicine | Admitting: Family Medicine

## 2017-02-24 DIAGNOSIS — H60322 Hemorrhagic otitis externa, left ear: Secondary | ICD-10-CM | POA: Diagnosis not present

## 2017-02-24 HISTORY — DX: Otitis media, unspecified, unspecified ear: H66.90

## 2017-02-24 MED ORDER — OFLOXACIN 0.3 % OT SOLN: 5.0000 [drp] | Freq: Two times a day (BID) | OTIC | 0 refills | Status: DC

## 2017-02-24 NOTE — ED Triage Notes (Signed)
Pt c/o left ear pain, she was cleaning her ear on Sunday with a q tip and it caused sharp pain and bleeding. The pain is getting worse.

## 2017-02-24 NOTE — ED Provider Notes (Signed)
CSN: [id number]     Arrival date & time 02/24/17  1155 History   First MD Initiated Contact with Patient 02/24/17 1450     Chief Complaint  Patient presents with  . Otalgia    left ear   (Consider location/radiation/quality/duration/timing/severity/associated sxs/prior Treatment) HPI  31 year old female who presents with pain in her left ear. She states that days ago while cleaning her ear with a Q-tip she experienced a very sharp pain and bleeding on the Q-tip. Said the pain is worsening. Not been bleeding since that time. She has some static sounds in that ear more amplified in usual.       Past Medical History:  Diagnosis Date  . Anxiety   . Depression   . DVT (deep venous thrombosis) ([place])   . Ear infection    Past Surgical History:  Procedure Laterality Date  . CHOLECYSTECTOMY     History reviewed. No pertinent family history. Social History  Substance Use Topics  . Smoking status: Never Smoker  . Smokeless tobacco: Never Used  . Alcohol use No   OB History    No data available     Review of Systems  Constitutional: Negative for activity change, appetite change, chills, fatigue and fever.  HENT: Positive for ear discharge and ear pain.   All other systems reviewed and are negative.   Allergies  Influenza vaccines; Sulfa antibiotics; and Topiramate  Home Medications   Prior to Admission medications   Medication Sig Start Date End Date Taking? Authorizing Provider  fluticasone (FLONASE) 50 MCG/ACT nasal spray Place 2 sprays into both nostrils daily. 01/30/17  Yes Historical Provider, MD  medroxyPROGESTERone (DEPO-PROVERA) 150 MG/ML injection Inject 150 mg into the muscle every 3 (three) months. Last injection June 2015   Yes Historical Provider, MD  omeprazole (PRILOSEC) 40 MG capsule Take 40 mg by mouth daily.   Yes Historical Provider, MD  potassium chloride SA (KLOR-CON M20) 20 MEQ tablet Take by mouth. 03/09/16  Yes Historical Provider, MD  sertraline  (ZOLOFT) 25 MG tablet Take 1 tablet (25 mg total) by mouth daily. 02/18/17 02/18/18 Yes Himabindu Ravi, MD  triamterene-hydrochlorothiazide (DYAZIDE) 37.5-25 MG capsule Take 1 capsule by mouth daily.   Yes Historical Provider, MD  ofloxacin (FLOXIN) 0.3 % otic solution Place 5 drops into the left ear 2 (two) times daily. 02/24/17   Lorin Picket, PA-C   Meds Ordered and Administered this Visit  Medications - No data to display  BP (!) 145/76 (BP Location: Left Arm)   Pulse 72   Temp 97.9 F (36.6 C) (Oral)   Resp 18   Ht 5\' 6"  (1.676 m)   Wt 250 lb (113.4 kg)   SpO2 100%   BMI 40.35 kg/m  No data found.   Physical Exam  Constitutional: She is oriented to person, place, and time. She appears well-developed and well-nourished. No distress.  HENT:  Head: Normocephalic and atraumatic.  Right Ear: External ear normal.  Examination of the right ear shows erythema and excoriations of the posterior ear canal but no bleeding. Examination of left ear shows a very similar findings. Both TMs are normal. The floor of the left ear canal however is very erythematous inflamed but not swollen. There is dried blood in the canal.  Eyes: Pupils are equal, round, and reactive to light. Right eye exhibits no discharge. Left eye exhibits no discharge.  Neck: Normal range of motion. Neck supple.  Musculoskeletal: Normal range of motion.  Lymphadenopathy:  She has no cervical adenopathy.  Neurological: She is alert and oriented to person, place, and time.  Skin: Skin is warm and dry. She is not diaphoretic.  Psychiatric: She has a normal mood and affect. Her behavior is normal. Judgment and thought content normal.  Nursing note and vitals reviewed.   Urgent Care Course     Procedures (including critical care time)  Labs Review Labs Reviewed - No data to display  Imaging Review No results found.   Visual Acuity Review  Right Eye Distance:   Left Eye Distance:   Bilateral Distance:     Right Eye Near:   Left Eye Near:    Bilateral Near:         MDM   1. Acute hemorrhagic otitis externa of left ear    Discharge Medication List as of 02/24/2017  3:06 PM    START taking these medications   Details  ofloxacin (FLOXIN) 0.3 % otic solution Place 5 drops into the left ear 2 (two) times daily., Starting Tue 02/24/2017, Normal      Plan: 1. Test/x-ray results and diagnosis reviewed with patient 2. rx as per orders; risks, benefits, potential side effects reviewed with patient 3. Recommend supportive treatment with not Using Q-tips in her ears. Use of Motrin or Tylenol for pain. If she continues to have pain she Should follow-up with ENT and name and number were provided to her. 4. F/u prn if symptoms worsen or don't improve     Lorin Picket, PA-C 02/24/17 1545

## 2017-03-11 ENCOUNTER — Ambulatory Visit (INDEPENDENT_AMBULATORY_CARE_PROVIDER_SITE_OTHER): Payer: BLUE CROSS/BLUE SHIELD | Admitting: Psychiatry

## 2017-03-11 ENCOUNTER — Encounter: Payer: Self-pay | Admitting: Psychiatry

## 2017-03-11 VITALS — BP 127/79 | HR 87 | Temp 98.8°F | Wt 253.4 lb

## 2017-03-11 DIAGNOSIS — F411 Generalized anxiety disorder: Secondary | ICD-10-CM | POA: Diagnosis not present

## 2017-03-11 MED ORDER — TRAZODONE HCL 50 MG PO TABS: 50.0000 mg | ORAL_TABLET | Freq: Every day | ORAL | 1 refills | Status: DC

## 2017-03-11 NOTE — Progress Notes (Signed)
Psychiatric progress note  Patient Identification: Evelyn Flores MRN:  [id number] Date of Evaluation:  03/11/2017 Referral Source: self referred Chief Complaint:   Chief Complaint    Follow-up; Medication Refill     Visit Diagnosis:    ICD-9-CM ICD-10-CM   1. GAD (generalized anxiety disorder) 300.02 F41.1     History of Present Illness:  Patient is a 31 year old Caucasian female who was seen today for a follow-up of depression and anxiety. She reports that she has been tolerating the Zoloft quite well. States that she feels much better as compared to before. Mood has significantly improved. She is functioning much better her at her job though it has gotten busy. She is talking more. Reports she continues to have some trouble with sleeping. She is also having some leg pains due to some venous insufficiency and states that she is to be quite obese and has been losing weight. Denies any suicidal thoughts. Overall doing quite well.  Past Psychiatric History: No psychiatric hospitalizations. Denies any suicide attempts.  Previous Psychotropic Medications: Yes Celexa, Lorazepam  Substance Abuse History in the last 12 months:  No.  Consequences of Substance Abuse: Negative  Past Medical History:  Past Medical History:  Diagnosis Date  . Anxiety   . Depression   . DVT (deep venous thrombosis) ([place])   . Ear infection     Past Surgical History:  Procedure Laterality Date  . CHOLECYSTECTOMY      Family Psychiatric History: Mom had panic episodes.  Family History: History reviewed. No pertinent family history.  Social History:   Social History   Social History  . Marital status: Single    Spouse name: N/A  . Number of children: N/A  . Years of education: N/A   Social History Main Topics  . Smoking status: Never Smoker  . Smokeless tobacco: Never Used  . Alcohol use No  . Drug use: No  . Sexual activity: Yes    Birth control/ protection: Injection, Condom   Other  Topics Concern  . None   Social History Narrative  . None    Additional Social History: Lives with her parents in [place].  Allergies:   Allergies  Allergen Reactions  . Influenza Vaccines Other (See Comments)    Very sick and was hospitalized   . Sulfa Antibiotics Swelling  . Topiramate Other (See Comments)    Mental fogginess, tingling, numbness    Metabolic Disorder Labs: No results found for: HGBA1C, MPG No results found for: PROLACTIN No results found for: CHOL, TRIG, HDL, CHOLHDL, VLDL, LDLCALC   Current Medications: Current Outpatient Prescriptions  Medication Sig Dispense Refill  . fluticasone (FLONASE) 50 MCG/ACT nasal spray Place 2 sprays into both nostrils daily.  12  . medroxyPROGESTERone (DEPO-PROVERA) 150 MG/ML injection Inject 150 mg into the muscle every 3 (three) months. Last injection June 2015    . ofloxacin (FLOXIN) 0.3 % otic solution Place 5 drops into the left ear 2 (two) times daily. 5 mL 0  . omeprazole (PRILOSEC) 40 MG capsule Take 40 mg by mouth daily.    . potassium chloride SA (KLOR-CON M20) 20 MEQ tablet Take by mouth.    . sertraline (ZOLOFT) 25 MG tablet Take 1 tablet (25 mg total) by mouth daily. 30 tablet 2  . triamterene-hydrochlorothiazide (DYAZIDE) 37.5-25 MG capsule Take 1 capsule by mouth daily.     No current facility-administered medications for this visit.     Neurologic: Headache: No Seizure: No Paresthesias:No  Musculoskeletal: Strength &  Muscle Tone: within normal limits Gait & Station: normal Patient leans: N/A  Psychiatric Specialty Exam: ROS  Blood pressure 127/79, pulse 87, temperature 98.8 F (37.1 C), temperature source Oral, weight 253 lb 6.4 oz (114.9 kg).Body mass index is 40.9 kg/m.  General Appearance: Casual  Eye Contact:  Fair  Speech:  Clear and Coherent  Volume:  Normal  Mood:  Significantly improved   Affect:  Pleasant   Thought Process:  Coherent  Orientation:  Full (Time, Place, and Person)   Thought Content:  Logical  Suicidal Thoughts:  No  Homicidal Thoughts:  No  Memory:  Immediate;   Fair Recent;   Fair Remote;   Fair  Judgement:  Fair  Insight:  Fair  Psychomotor Activity:  Normal  Concentration:  Concentration: Fair and Attention Span: Fair  Recall:  [employer] of Knowledge:Fair  Language: Fair  Akathisia:  No  Handed:  Right  AIMS (if indicated):  na  Assets:  Communication Skills Desire for Improvement Vocational/Educational  ADL's:  Intact  Cognition: WNL  Sleep:  fair    Treatment Plan Summary:  Major Depressive Disorder, mild  Continue Zoloft at 25mg  po qd, side effects discussed. Patient would like to start therapy but states that she is holding off due to the finances.  Generalized anxiety disorder Same as above  Insomnia Start trazodone at 50 mg at bedtime  Return to clinic in 2 months time or call before if needed  Elvin So, MD 3/21/20182:02 PM

## 2017-05-02 ENCOUNTER — Other Ambulatory Visit: Payer: Self-pay | Admitting: Psychiatry

## 2017-05-06 ENCOUNTER — Ambulatory Visit: Payer: BLUE CROSS/BLUE SHIELD | Admitting: Psychiatry

## 2017-05-07 ENCOUNTER — Encounter: Payer: Self-pay | Admitting: Dietician

## 2017-05-07 ENCOUNTER — Encounter: Payer: BLUE CROSS/BLUE SHIELD | Attending: Neurology | Admitting: Dietician

## 2017-05-07 VITALS — Ht 66.0 in | Wt 251.9 lb

## 2017-05-07 DIAGNOSIS — IMO0001 Reserved for inherently not codable concepts without codable children: Secondary | ICD-10-CM

## 2017-05-07 DIAGNOSIS — Z6841 Body Mass Index (BMI) 40.0 and over, adult: Secondary | ICD-10-CM

## 2017-05-07 DIAGNOSIS — E669 Obesity, unspecified: Secondary | ICD-10-CM

## 2017-05-07 NOTE — Progress Notes (Signed)
Medical Nutrition Therapy: Visit start time: 10:30am  end time: 11:30am Assessment:  Diagnosis: obesity Past medical history: PCOS,vestibular migraines Psychosocial issues/ stress concerns: Patient rates her stress as low; states that medication has significantly helped symptoms of depression. Preferred learning method:  . Auditory . Visual Current weight: 251.9 Height: 65 in Medications, supplements: see list  Progress and evaluation:  Patient in for initial medical nutrition therapy appointment. She reports a weight loss of approximately 100 lbs in the past 2-3 years through help of medication and diet changes. She reports that her weight has  plateaued in past several months.  She works 2nd shift from 4:00pm-12:00am. After getting up at 10-11:00 and waiting after taking medication, she eats breakfast foods around noon. She doesn't eat again until 8-9:00pm at work and acknowledges she is extremely hungry for this meal.  She eats a small snack after work, 12:30-2:00pm and is trying to limit portions. Her present diet is almost void of fruits and vegetables and is low in fiber during the work week. She does eat some vegetables for evening meal that her mother prepares on weekends. She rarely dines "out". Her main beverage is water and she drinks at  Least 64 oz daily.    Physical activity: 30 minutes of cardio 3-4 days per week.   Nutrition Care Education:  Basic nutrition: Instructed on food group servings needed to meet basic nutrient needs. Encouraged to focus on addition of foods needed to meet nutrient needs verses restriction.  Weight control: Commended on her weight loss success in the past 3 years. Instructed on a meal plan based on approximately 1900 calories but encouraged to use meal plan as a guide to be mindful of portions and better balancing carbohydrate, protein and non-starchy vegetables. Discussed the insulin resistance associated with PCOS and how diet and exercise can  decrease risk of developing diabetes. Discussed how increasing intensity of exercise may be helpful if she has maintained same intensity over the past few months. Sodium: Discussed how a low sodium diet may be helpful if Meniere's is contributing to vestibular issues.  Nutritional Diagnosis:  NI-5.11.1 Predicted suboptimal nutrient intake As related to significantly low intake of fruits, vegetables, fiber and whole grains.  As evidenced by diet history..  Intervention:  Balance meals with protein, 2-4 servings of carbohydrate and "free vegetables". Work in fruit as one of your carbohydrate servings as often as possible. Keep image of food guide plate in mind when trying to balance meals. Include a small meal consisting of 1-2 oz protein, 2 servings of carbohydrate and free vegetable at 3:00 before going to work. Take some cut up vegetables and fruit to add to sandwich and yogurt meal at work.  Read labels for sodium and keep meals in range of no more than 500-600mg  sodium. Consider a phone app such as myfitness pal to track food and beverage  Education Materials given:  . Plate Planner . Food lists/ Planning A Balanced Meal . Quick and Healthy Meals . Goals/ instructions  Learner/ who was taught:  . Patient   Level of understanding: . Partial understanding; needs review/ practice Demonstrated degree of understanding via:   Teach back Learning barriers: . None  Willingness to learn/ readiness for change: . Eager, change in progress  Monitoring and Evaluation:  Dietary intake, exercise, , and body weight      follow up: 06/11/17 at 10:30am

## 2017-05-07 NOTE — Patient Instructions (Signed)
Balance meals with protein, 2-4 servings of carbohydrate and "free vegetables". Work in fruit as one of your carbohydrate servings as often as possible. Keep image of food guide plate in mind when trying to balance meals. Include a small meal consisting of 1-2 oz protein, 2 servings of carbohydrate and free vegetable at 3:00 before going to work. Take some cut up vegetables and fruit to add to sandwich and yogurt meal at work.  Read labels for sodium and keep meals in range of no more than 500-600mg  sodium. Consider a phone app such as myfitness pal to track food and beverage intake.

## 2017-05-13 ENCOUNTER — Ambulatory Visit (INDEPENDENT_AMBULATORY_CARE_PROVIDER_SITE_OTHER): Payer: BLUE CROSS/BLUE SHIELD | Admitting: Psychiatry

## 2017-05-13 ENCOUNTER — Encounter: Payer: Self-pay | Admitting: Psychiatry

## 2017-05-13 VITALS — BP 130/80 | HR 66 | Temp 99.3°F | Wt 250.4 lb

## 2017-05-13 DIAGNOSIS — F411 Generalized anxiety disorder: Secondary | ICD-10-CM | POA: Diagnosis not present

## 2017-05-13 DIAGNOSIS — F330 Major depressive disorder, recurrent, mild: Secondary | ICD-10-CM | POA: Diagnosis not present

## 2017-05-13 MED ORDER — SERTRALINE HCL 50 MG PO TABS: 50.0000 mg | ORAL_TABLET | Freq: Every day | ORAL | 1 refills | Status: DC

## 2017-05-13 MED ORDER — TRAZODONE HCL 50 MG PO TABS: 50.0000 mg | ORAL_TABLET | Freq: Every day | ORAL | 1 refills | Status: DC

## 2017-05-13 NOTE — Progress Notes (Signed)
Psychiatric progress note  Patient Identification: Evelyn Flores MRN:  [id number] Date of Evaluation:  05/13/2017 Referral Source: self referred Chief Complaint:   Chief Complaint    Follow-up; Medication Refill     Visit Diagnosis:    ICD-9-CM ICD-10-CM   1. 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 GAD (generalized anxiety disorder) 300.02 F41.1     History of Present Illness:  Patient is a 31 year old Caucasian female who was seen today for a follow-up of depression and anxiety. Reports doing well in terms of her mood, continues Feel very tired. States that though she is getting about 7-8 hours of sleep she feels exhausted and mentally. She is also having some leg pains due to some venous insufficiency and states that she will be having surgery to repair valves in her legs. Denies any suicidal thoughts. Overall doing quite well.  Past Psychiatric History: No psychiatric hospitalizations. Denies any suicide attempts.  Previous Psychotropic Medications: Yes Celexa, Lorazepam  Substance Abuse History in the last 12 months:  No.  Consequences of Substance Abuse: Negative  Past Medical History:  Past Medical History:  Diagnosis Date  . Anxiety   . Depression   . DVT (deep venous thrombosis) ([place])   . Ear infection     Past Surgical History:  Procedure Laterality Date  . CHOLECYSTECTOMY      Family Psychiatric History: Mom had panic episodes.  Family History: History reviewed. No pertinent family history.  Social History:   Social History   Social History  . Marital status: Single    Spouse name: N/A  . Number of children: N/A  . Years of education: N/A   Social History Main Topics  . Smoking status: Never Smoker  . Smokeless tobacco: Never Used  . Alcohol use No  . Drug use: No  . Sexual activity: Yes    Birth control/ protection: Injection, Condom   Other Topics Concern  . None   Social History Narrative  . None     Additional Social History: Lives with her parents in [place].  Allergies:   Allergies  Allergen Reactions  . Influenza Vaccines Other (See Comments)    Very sick and was hospitalized   . Sulfa Antibiotics Swelling  . Topiramate Other (See Comments)    Mental fogginess, tingling, numbness    Metabolic Disorder Labs: No results found for: HGBA1C, MPG No results found for: PROLACTIN No results found for: CHOL, TRIG, HDL, CHOLHDL, VLDL, LDLCALC   Current Medications: Current Outpatient Prescriptions  Medication Sig Dispense Refill  . fluticasone (FLONASE) 50 MCG/ACT nasal spray Place 2 sprays into both nostrils daily.  12  . medroxyPROGESTERone (DEPO-PROVERA) 150 MG/ML injection Inject 150 mg into the muscle every 3 (three) months. Last injection June 2015    . ofloxacin (FLOXIN) 0.3 % otic solution Place 5 drops into the left ear 2 (two) times daily. 5 mL 0  . omeprazole (PRILOSEC) 40 MG capsule Take 40 mg by mouth daily.    . potassium chloride SA (KLOR-CON M20) 20 MEQ tablet Take by mouth.    . sertraline (ZOLOFT) 25 MG tablet Take 1 tablet (25 mg total) by mouth daily. 30 tablet 2  . traZODone (DESYREL) 50 MG tablet Take 1 tablet (50 mg total) by mouth at bedtime. 30 tablet 1  . triamterene-hydrochlorothiazide (DYAZIDE) 37.5-25 MG capsule Take 1 capsule by mouth daily.     No current facility-administered medications for this visit.     Neurologic:  Headache: No Seizure: No Paresthesias:No  Musculoskeletal: Strength & Muscle Tone: within normal limits Gait & Station: normal Patient leans: N/A  Psychiatric Specialty Exam: ROS  Blood pressure 130/80, pulse 66, temperature 99.3 F (37.4 C), temperature source Oral, weight 250 lb 6.4 oz (113.6 kg).Body mass index is 40.42 kg/m.  General Appearance: Casual  Eye Contact:  Fair  Speech:  Clear and Coherent  Volume:  Normal  Mood:  Significantly improved   Affect:  Pleasant   Thought Process:  Coherent  Orientation:   Full (Time, Place, and Person)  Thought Content:  Logical  Suicidal Thoughts:  No  Homicidal Thoughts:  No  Memory:  Immediate;   Fair Recent;   Fair Remote;   Fair  Judgement:  Fair  Insight:  Fair  Psychomotor Activity:  Normal  Concentration:  Concentration: Fair and Attention Span: Fair  Recall:  [employer] of Knowledge:Fair  Language: Fair  Akathisia:  No  Handed:  Right  AIMS (if indicated):  na  Assets:  Communication Skills Desire for Improvement Vocational/Educational  ADL's:  Intact  Cognition: WNL  Sleep:  fair    Treatment Plan Summary:  Major Depressive Disorder, mild  Increase Zoloft to 50 mg, side effects discussed. Patient would like to start therapy but states that she is holding off due to the finances.  Generalized anxiety disorder Same as above  Insomnia Continue trazodone at 50 mg at bedtime., okay to take 2 tablets if she cannot fall asleep when she takes 1.  Return to clinic in 1 months time or call before if needed  Elvin So, MD 5/23/201811:14 AM

## 2017-06-10 ENCOUNTER — Ambulatory Visit (INDEPENDENT_AMBULATORY_CARE_PROVIDER_SITE_OTHER): Payer: BLUE CROSS/BLUE SHIELD | Admitting: Psychiatry

## 2017-06-10 ENCOUNTER — Encounter: Payer: Self-pay | Admitting: Psychiatry

## 2017-06-10 VITALS — BP 120/76 | HR 78 | Temp 98.9°F | Wt 253.2 lb

## 2017-06-10 DIAGNOSIS — F330 Major depressive disorder, recurrent, mild: Secondary | ICD-10-CM

## 2017-06-10 DIAGNOSIS — F411 Generalized anxiety disorder: Secondary | ICD-10-CM | POA: Diagnosis not present

## 2017-06-10 MED ORDER — SERTRALINE HCL 50 MG PO TABS: 50.0000 mg | ORAL_TABLET | Freq: Every day | ORAL | 1 refills | Status: DC

## 2017-06-10 MED ORDER — TRAZODONE HCL 50 MG PO TABS: 50.0000 mg | ORAL_TABLET | Freq: Every day | ORAL | 1 refills | Status: DC

## 2017-06-10 NOTE — Progress Notes (Signed)
Psychiatric progress note  Patient Identification: Evelyn Flores MRN:  [id number] Date of Evaluation:  06/10/2017 Referral Source: self referred Chief Complaint:   Chief Complaint    Follow-up; Medication Refill     Visit Diagnosis:    ICD-10-CM   1. MDD (major depressive disorder), recurrent episode, mild ([place]) F33.0   2. GAD (generalized anxiety disorder) F41.1     History of Present Illness:  Patient is a 31 year old Caucasian female who was seen today for a follow-up of depression and anxiety. Reports doing much better on the higher dose of the zoloft. Not feeling tired mentally, sleeping well. Mood is quite stable. Her vein surgery went well and she is back to work. Denies any suicidal thoughts.  Past Psychiatric History: No psychiatric hospitalizations. Denies any suicide attempts.  Previous Psychotropic Medications: Yes Celexa, Lorazepam  Substance Abuse History in the last 12 months:  No.  Consequences of Substance Abuse: Negative  Past Medical History:  Past Medical History:  Diagnosis Date  . Anxiety   . Depression   . DVT (deep venous thrombosis) ([place])   . Ear infection     Past Surgical History:  Procedure Laterality Date  . CHOLECYSTECTOMY    . VEIN SURGERY Bilateral    vein surgery both legs    Family Psychiatric History: Mom had panic episodes.  Family History: History reviewed. No pertinent family history.  Social History:   Social History   Social History  . Marital status: Single    Spouse name: N/A  . Number of children: N/A  . Years of education: N/A   Social History Main Topics  . Smoking status: Never Smoker  . Smokeless tobacco: Never Used  . Alcohol use No  . Drug use: No  . Sexual activity: Yes    Birth control/ protection: Injection, Condom   Other Topics Concern  . None   Social History Narrative  . None    Additional Social History: Lives with her parents in [place].  Allergies:   Allergies  Allergen Reactions  .  Influenza Vaccines Other (See Comments)    Very sick and was hospitalized   . Sulfa Antibiotics Swelling  . Topiramate Other (See Comments)    Mental fogginess, tingling, numbness    Metabolic Disorder Labs: No results found for: HGBA1C, MPG No results found for: PROLACTIN No results found for: CHOL, TRIG, HDL, CHOLHDL, VLDL, LDLCALC   Current Medications: Current Outpatient Prescriptions  Medication Sig Dispense Refill  . fluticasone (FLONASE) 50 MCG/ACT nasal spray Place 2 sprays into both nostrils daily.  12  . medroxyPROGESTERone (DEPO-PROVERA) 150 MG/ML injection Inject 150 mg into the muscle every 3 (three) months. Last injection June 2015    . ofloxacin (FLOXIN) 0.3 % otic solution Place 5 drops into the left ear 2 (two) times daily. 5 mL 0  . omeprazole (PRILOSEC) 40 MG capsule Take 40 mg by mouth daily.    . potassium chloride SA (KLOR-CON M20) 20 MEQ tablet Take by mouth.    . sertraline (ZOLOFT) 50 MG tablet Take 1 tablet (50 mg total) by mouth daily. 30 tablet 1  . traZODone (DESYREL) 50 MG tablet Take 1 tablet (50 mg total) by mouth at bedtime. 30 tablet 1  . triamterene-hydrochlorothiazide (DYAZIDE) 37.5-25 MG capsule Take 1 capsule by mouth daily.     No current facility-administered medications for this visit.     Neurologic: Headache: No Seizure: No Paresthesias:No  Musculoskeletal: Strength & Muscle Tone: within normal limits Gait & Station:  normal Patient leans: N/A  Psychiatric Specialty Exam: ROS  Blood pressure 120/76, pulse 78, temperature 98.9 F (37.2 C), temperature source Oral, weight 253 lb 3.2 oz (114.9 kg).Body mass index is 40.87 kg/m.  General Appearance: Casual  Eye Contact:  Fair  Speech:  Clear and Coherent  Volume:  Normal  Mood:  good  Affect:  Pleasant   Thought Process:  Coherent  Orientation:  Full (Time, Place, and Person)  Thought Content:  Logical  Suicidal Thoughts:  No  Homicidal Thoughts:  No  Memory:  Immediate;    Fair Recent;   Fair Remote;   Fair  Judgement:  Fair  Insight:  Fair  Psychomotor Activity:  Normal  Concentration:  Concentration: Fair and Attention Span: Fair  Recall:  [employer] of Knowledge:Fair  Language: Fair  Akathisia:  No  Handed:  Right  AIMS (if indicated):  na  Assets:  Communication Skills Desire for Improvement Vocational/Educational  ADL's:  Intact  Cognition: WNL  Sleep:  fair    Treatment Plan Summary:  Major Depressive Disorder, mild  Continue  Zoloft at 50 mg, side effects discussed. Patient would like to start therapy but states that she is holding off due to the finances.  Generalized anxiety disorder Same as above  Insomnia Continue trazodone at 50 mg at bedtime., okay to take 2 tablets if she cannot fall asleep when she takes 1.  Return to clinic in 2 months time or call before if needed  Elvin So, MD 6/20/201810:08 AM

## 2017-06-11 ENCOUNTER — Encounter: Payer: BLUE CROSS/BLUE SHIELD | Attending: Neurology | Admitting: Dietician

## 2017-06-11 ENCOUNTER — Encounter: Payer: Self-pay | Admitting: Dietician

## 2017-06-11 VITALS — Ht 65.0 in | Wt 251.5 lb

## 2017-06-11 DIAGNOSIS — Z6841 Body Mass Index (BMI) 40.0 and over, adult: Secondary | ICD-10-CM | POA: Diagnosis not present

## 2017-06-11 DIAGNOSIS — E669 Obesity, unspecified: Secondary | ICD-10-CM | POA: Diagnosis not present

## 2017-06-11 DIAGNOSIS — IMO0001 Reserved for inherently not codable concepts without codable children: Secondary | ICD-10-CM

## 2017-06-11 NOTE — Patient Instructions (Addendum)
Include a snack at work (5-6:00pm) that includes a carbohydrate, protein and non-starchy vegetables. Refer to snack hand-out. Add exercise as allowed to do based on healing from surgery. Continue to read labels for sodium with goal of no more than 500-600mg /meal. Consider myfitness pal to track food and beverage intake.

## 2017-06-11 NOTE — Progress Notes (Signed)
Medical Nutrition Therapy Follow-up visit:  Time with patient: 10:40-11:50 Visit #:2 ASSESSMENT:  Diagnosis:obesity  Current weight:251.5 lbs    Height:65 in Medications: See list Medical History: PCOS, vestibular migraines Progress and evaluation: Patient in for medical nutrition therapy follow-up. She reports she had vein surgery last week and is pleased that her weight has been stable since last visit 5 weeks ago especially since activity has been limited. She states she is being more mindful of her food choices. She is back at work this week with work hours of 4:00pm-12:00am  She continues to eat breakfast at 11:00am.She is now including a small snack; usually raw vegetables at 6:00 (at work). She is often choosing Healthy Choice meals to eat at her dinner break at work and is now choosing meals  that have less than 600mg  of sodium.  She adds yogurt to this meal as well. She does not always eat a snack after work. Her main beverage continues to be water.   NUTRITION CARE EDUCATION: Basic nutrition/Weight control:  Commended on her mindfulness concerning her food choices and in  adding more vegetables to her diet. Reviewed food group servings needed and practical ways to improve on this. Again, discouraged focus on food restriction.  Encouraged that 6:00pm snack also include at least I serving of carbohydrate and 1-2 oz protein. Gave and reviewed a hand-out with snack options. Discussed importance of avoiding long periods of time between meals.  INTERVENTION:  Include a snack at work (5-6:00pm) that includes a carbohydrate, protein and non-starchy vegetables. Refer to snack hand-out. Add exercise as allowed to do, based on healing from surgery. Continue to read labels for sodium with goal of no more than 500-600mg /meal. Consider myfitness pal to track food and beverage intake.  EDUCATION MATERIALS GIVEN:  . "Sound and Smart Snacking" . Goals/ instructions  LEARNER/ who was  taught:  . Patient  LEVEL OF UNDERSTANDING: . Verbalizes/ demonstrates competency .  Demonstrated degree of understanding via teach back.  LEARNING BARRIERS: . None  WILLINGNESS TO LEARN/READINESS FOR CHANGE:  Change in Progress MONITORING AND EVALUATION:  No folow-up scheduled; Patient was encouraged to call if desires further help with her diet/nutrition.

## 2017-07-04 ENCOUNTER — Other Ambulatory Visit: Payer: Self-pay | Admitting: Psychiatry

## 2017-08-06 ENCOUNTER — Ambulatory Visit: Payer: BLUE CROSS/BLUE SHIELD | Admitting: Psychiatry

## 2017-08-06 ENCOUNTER — Encounter: Payer: Self-pay | Admitting: Psychiatry

## 2017-08-06 VITALS — BP 122/77 | HR 65 | Temp 98.0°F

## 2017-08-06 DIAGNOSIS — F330 Major depressive disorder, recurrent, mild: Secondary | ICD-10-CM

## 2017-08-06 DIAGNOSIS — F411 Generalized anxiety disorder: Secondary | ICD-10-CM

## 2017-08-06 MED ORDER — TRAZODONE HCL 50 MG PO TABS
50.0000 mg | ORAL_TABLET | Freq: Every day | ORAL | 2 refills | Status: DC
Start: 2017-08-06 — End: 2017-11-10

## 2017-08-06 MED ORDER — SERTRALINE HCL 50 MG PO TABS: 50.0000 mg | ORAL_TABLET | Freq: Every day | ORAL | 2 refills | Status: DC

## 2017-08-06 NOTE — Progress Notes (Signed)
Psychiatric progress note  Patient Identification: Evelyn Flores MRN:  [id number] Date of Evaluation:  08/06/2017 Referral Source: self referred Chief Complaint:   Chief Complaint    Follow-up; Medication Refill     Visit Diagnosis:    ICD-10-CM   1. MDD (major depressive disorder), recurrent episode, mild ([place]) F33.0   2. GAD (generalized anxiety disorder) F41.1     History of Present Illness:  Patient is a 31 year old Caucasian female who was seen today for a follow-up of depression and anxiety. Reports doing well overall. Working a lot since it is busy season. [place] she gets paid overtime. Fair sleep and appetite. Denies any suicidal thoughts.  Past Psychiatric History: No psychiatric hospitalizations. Denies any suicide attempts.  Previous Psychotropic Medications: Yes Celexa, Lorazepam  Substance Abuse History in the last 12 months:  No.  Consequences of Substance Abuse: Negative  Past Medical History:  Past Medical History:  Diagnosis Date  . Anxiety   . Depression   . DVT (deep venous thrombosis) ([place])   . Ear infection     Past Surgical History:  Procedure Laterality Date  . CHOLECYSTECTOMY    . VEIN SURGERY Bilateral    vein surgery both legs    Family Psychiatric History: Mom had panic episodes.  Family History: History reviewed. No pertinent family history.  Social History:   Social History   Social History  . Marital status: Single    Spouse name: N/A  . Number of children: N/A  . Years of education: N/A   Social History Main Topics  . Smoking status: Never Smoker  . Smokeless tobacco: Never Used  . Alcohol use No  . Drug use: No  . Sexual activity: Yes    Birth control/ protection: Injection, Condom   Other Topics Concern  . None   Social History Narrative  . None    Additional Social History: Lives with her parents in [place].  Allergies:   Allergies  Allergen Reactions  . Influenza Vaccines Other (See Comments)    Very sick  and was hospitalized   . Sulfa Antibiotics Swelling  . Topiramate Other (See Comments)    Mental fogginess, tingling, numbness    Metabolic Disorder Labs: No results found for: HGBA1C, MPG No results found for: PROLACTIN No results found for: CHOL, TRIG, HDL, CHOLHDL, VLDL, LDLCALC   Current Medications: Current Outpatient Prescriptions  Medication Sig Dispense Refill  . fluticasone (FLONASE) 50 MCG/ACT nasal spray Place 2 sprays into both nostrils daily.  12  . medroxyPROGESTERone (DEPO-PROVERA) 150 MG/ML injection Inject 150 mg into the muscle every 3 (three) months. Last injection June 2015    . ofloxacin (FLOXIN) 0.3 % otic solution Place 5 drops into the left ear 2 (two) times daily. 5 mL 0  . omeprazole (PRILOSEC) 40 MG capsule Take 40 mg by mouth daily.    . potassium chloride SA (KLOR-CON M20) 20 MEQ tablet Take by mouth.    . sertraline (ZOLOFT) 50 MG tablet Take 1 tablet (50 mg total) by mouth daily. 30 tablet 1  . traZODone (DESYREL) 50 MG tablet Take 1 tablet (50 mg total) by mouth at bedtime. 30 tablet 1  . triamterene-hydrochlorothiazide (DYAZIDE) 37.5-25 MG capsule Take 1 capsule by mouth daily.     No current facility-administered medications for this visit.     Neurologic: Headache: No Seizure: No Paresthesias:No  Musculoskeletal: Strength & Muscle Tone: within normal limits Gait & Station: normal Patient leans: N/A  Psychiatric Specialty Exam: ROS  Blood  pressure 122/77, pulse 65, temperature 98 F (36.7 C), temperature source Oral.There is no height or weight on file to calculate BMI.  General Appearance: Casual  Eye Contact:  Fair  Speech:  Clear and Coherent  Volume:  Normal  Mood:  good  Affect:  Pleasant   Thought Process:  Coherent  Orientation:  Full (Time, Place, and Person)  Thought Content:  Logical  Suicidal Thoughts:  No  Homicidal Thoughts:  No  Memory:  Immediate;   Fair Recent;   Fair Remote;   Fair  Judgement:  Fair  Insight:   Fair  Psychomotor Activity:  Normal  Concentration:  Concentration: Fair and Attention Span: Fair  Recall:  [employer] of Knowledge:Fair  Language: Fair  Akathisia:  No  Handed:  Right  AIMS (if indicated):  na  Assets:  Communication Skills Desire for Improvement Vocational/Educational  ADL's:  Intact  Cognition: WNL  Sleep:  fair    Treatment Plan Summary:  Major Depressive Disorder, mild  Continue  Zoloft at 50 mg, side effects discussed.  Generalized anxiety disorder Same as above  Insomnia Continue trazodone at 50 mg at bedtime., okay to take 2 tablets if she cannot fall asleep when she takes 1.  Return to clinic in 3 months time or call before if needed  Elvin So, MD 8/16/201810:31 AM

## 2017-09-14 ENCOUNTER — Other Ambulatory Visit: Payer: Self-pay | Admitting: Neurology

## 2017-09-14 DIAGNOSIS — H4710 Unspecified papilledema: Secondary | ICD-10-CM

## 2017-09-17 ENCOUNTER — Ambulatory Visit
Admission: RE | Admit: 2017-09-17 | Discharge: 2017-09-17 | Disposition: A | Discharge status: Home / Self Care | Payer: BLUE CROSS/BLUE SHIELD | Source: Ambulatory Visit | Attending: Neurology | Admitting: Neurology

## 2017-09-17 DIAGNOSIS — G932 Benign intracranial hypertension: Secondary | ICD-10-CM | POA: Diagnosis present

## 2017-09-17 DIAGNOSIS — H4710 Unspecified papilledema: Secondary | ICD-10-CM | POA: Insufficient documentation

## 2017-09-17 LAB — CSF CELL COUNT WITH DIFFERENTIAL
Eosinophils, CSF: 0 %
Lymphs, CSF: 71 %
Monocyte-Macrophage-Spinal Fluid: 29 %
RBC Count, CSF: 52 /mm3 — ABNORMAL HIGH (ref 0–3)
Segmented Neutrophils-CSF: 0 %
Tube #: 3
WBC, CSF: 3 /mm3 (ref 0–5)

## 2017-09-17 LAB — GLUCOSE, CSF: Glucose, CSF: 55 mg/dL (ref 40–70)

## 2017-09-17 LAB — PROTEIN, CSF: Total  Protein, CSF: 26 mg/dL (ref 15–45)

## 2017-09-17 LAB — PREGNANCY, URINE: Preg Test, Ur: NEGATIVE

## 2017-09-17 MED ORDER — LIDOCAINE HCL (PF) 1 % IJ SOLN
5.0000 mL | Freq: Once | INTRAMUSCULAR | Status: AC
  Administered 2017-09-17: 5 mL
  Filled 2017-09-17: qty 5

## 2017-09-17 NOTE — OR Nursing (Signed)
Dr Register in to see pt, ok to discharge at this time

## 2017-09-17 NOTE — Discharge Instructions (Signed)
Lumbar Puncture, Care After °Refer to this sheet in the next few weeks. These instructions provide you with information on caring for yourself after your procedure. Your health care provider may also give you more specific instructions. Your treatment has been planned according to current medical practices, but problems sometimes occur. Call your health care provider if you have any problems or questions after your procedure. °What can I expect after the procedure? °After your procedure, it is typical to have the following sensations: °· Mild discomfort or pain at the insertion site. °· Mild headache that is relieved with pain medicines. ° °Follow these instructions at home: ° °· Avoid lifting anything heavier than 10 lb (4.5 kg) for at least 12 hours after the procedure. °· Drink enough fluids to keep your urine clear or pale yellow. °Contact a health care provider if: °· You have fever or chills. °· You have nausea or vomiting. °· You have a headache that lasts for more than 2 days. °Get help right away if: °· You have any numbness or tingling in your legs. °· You are unable to control your bowel or bladder. °· You have bleeding or swelling in your back at the insertion site. °· You are dizzy or faint. °This information is not intended to replace advice given to you by your health care provider. Make sure you discuss any questions you have with your health care provider. °Document Released: 12/13/2013 Document Revised: 05/15/2016 Document Reviewed: 08/16/2013 °Elsevier Interactive Patient Education © 2017 [employer] ° °

## 2017-09-17 NOTE — Progress Notes (Signed)
Pt. Stable after l.p.VSS.Back stable.F/U with her M.D.

## 2017-11-04 ENCOUNTER — Ambulatory Visit: Payer: BLUE CROSS/BLUE SHIELD | Admitting: Psychiatry

## 2017-11-05 ENCOUNTER — Ambulatory Visit: Payer: Self-pay | Admitting: Psychiatry

## 2017-11-10 ENCOUNTER — Encounter: Payer: Self-pay | Admitting: Psychiatry

## 2017-11-10 ENCOUNTER — Other Ambulatory Visit: Payer: Self-pay

## 2017-11-10 ENCOUNTER — Ambulatory Visit: Payer: BLUE CROSS/BLUE SHIELD | Admitting: Psychiatry

## 2017-11-10 VITALS — BP 120/75 | HR 83 | Temp 99.1°F | Wt 271.0 lb

## 2017-11-10 DIAGNOSIS — F411 Generalized anxiety disorder: Secondary | ICD-10-CM

## 2017-11-10 DIAGNOSIS — F330 Major depressive disorder, recurrent, mild: Secondary | ICD-10-CM

## 2017-11-10 MED ORDER — TRAZODONE HCL 150 MG PO TABS: 150.0000 mg | ORAL_TABLET | Freq: Every day | ORAL | 1 refills | Status: DC

## 2017-11-10 MED ORDER — SERTRALINE HCL 100 MG PO TABS: 100.0000 mg | ORAL_TABLET | Freq: Every day | ORAL | 2 refills | Status: DC
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
## 2017-11-24 ENCOUNTER — Ambulatory Visit: Payer: BLUE CROSS/BLUE SHIELD | Admitting: Psychiatry

## 2017-11-24 ENCOUNTER — Other Ambulatory Visit: Payer: Self-pay

## 2017-11-24 ENCOUNTER — Encounter: Payer: Self-pay | Admitting: Psychiatry

## 2017-11-24 VITALS — BP 128/78 | HR 84 | Temp 99.1°F | Wt 273.0 lb

## 2017-11-24 DIAGNOSIS — F411 Generalized anxiety disorder: Secondary | ICD-10-CM

## 2017-11-24 DIAGNOSIS — F330 Major depressive disorder, recurrent, mild: Secondary | ICD-10-CM

## 2017-11-24 NOTE — Progress Notes (Signed)
Psychiatric progress note  Patient Identification: Evelyn Flores MRN:  [id number] Date of Evaluation:  11/24/2017 Referral Source: self referred Chief Complaint: improved mood Chief Complaint    Follow-up; Medication Refill     Visit Diagnosis:    ICD-10-CM   1. MDD (major depressive disorder), recurrent episode, mild ([place]) F33.0   2. GAD (generalized anxiety disorder) F41.1     History of Present Illness:  Patient is a 31 year old Caucasian female who was seen today for a follow-up of depression and anxiety. Reports she is doing better. Sleeping better, not as exhausted. She saw her PCP , her vitamin D levels are low and she was started on a supplement. She was able to speak to her boss about her depression and he  Was very understanding. She reports that things are better though she continues to have trouble with relationships. We discussed her seeing a therapist and working on this. She is having an appointment with [name] next week.  Past Psychiatric History: No psychiatric hospitalizations. Denies any suicide attempts.  Previous Psychotropic Medications: Yes Celexa, Lorazepam  Substance Abuse History in the last 12 months:  No.  Consequences of Substance Abuse: Negative  Past Medical History:  Past Medical History:  Diagnosis Date  . Anxiety   . Depression   . DVT (deep venous thrombosis) ([place])   . Ear infection     Past Surgical History:  Procedure Laterality Date  . CHOLECYSTECTOMY    . VEIN SURGERY Bilateral    vein surgery both legs    Family Psychiatric History: Mom had panic episodes.  Family History: History reviewed. No pertinent family history.  Social History:   Social History   Socioeconomic History  . Marital status: Single    Spouse name: None  . Number of children: 0  . Years of education: None  . Highest education level: High school graduate  Social Needs  . Financial resource strain: Not hard at all  . Food insecurity - worry:  Never true  . Food insecurity - inability: Never true  . Transportation needs - medical: No  . Transportation needs - non-medical: No  Occupational History  . Occupation: sport endeavors    Comment: fulltime  Tobacco Use  . Smoking status: Never Smoker  . Smokeless tobacco: Never Used  Substance and Sexual Activity  . Alcohol use: No    Alcohol/week: 0.0 oz  . Drug use: No  . Sexual activity: Yes    Birth control/protection: Injection, Condom  Other Topics Concern  . None  Social History Narrative  . None    Additional Social History: Lives with her parents in [place].  Allergies:   Allergies  Allergen Reactions  . Influenza Vaccines Other (See Comments)    Very sick and was hospitalized   . Sulfa Antibiotics Swelling  . Topiramate Other (See Comments)    Mental fogginess, tingling, numbness    Metabolic Disorder Labs: No results found for: HGBA1C, MPG No results found for: PROLACTIN No results found for: CHOL, TRIG, HDL, CHOLHDL, VLDL, LDLCALC   Current Medications: Current Outpatient Medications  Medication Sig Dispense Refill  . fluticasone (FLONASE) 50 MCG/ACT nasal spray Place 2 sprays into both nostrils daily.  12  . medroxyPROGESTERone (DEPO-PROVERA) 150 MG/ML injection Inject 150 mg into the muscle every 3 (three) months. Last injection June 2015    . ofloxacin (FLOXIN) 0.3 % otic solution Place 5 drops into the left ear 2 (two) times daily. 5 mL 0  . omeprazole (PRILOSEC)  40 MG capsule Take 40 mg by mouth daily.    . potassium chloride SA (KLOR-CON M20) 20 MEQ tablet Take by mouth.    . sertraline (ZOLOFT) 100 MG tablet Take 1 tablet (100 mg total) by mouth daily. 30 tablet 2  . traZODone (DESYREL) 150 MG tablet Take 1 tablet (150 mg total) by mouth at bedtime. 30 tablet 1  . triamterene-hydrochlorothiazide (DYAZIDE) 37.5-25 MG capsule Take 1 capsule by mouth daily.     No current facility-administered medications for this visit.      Neurologic: Headache: No Seizure: No Paresthesias:No  Musculoskeletal: Strength & Muscle Tone: within normal limits Gait & Station: normal Patient leans: N/A  Psychiatric Specialty Exam: ROS  Blood pressure 128/78, pulse 84, temperature 99.1 F (37.3 C), temperature source Oral, weight 123.8 kg (273 lb).Body mass index is 45.43 kg/m.  General Appearance: Casual  Eye Contact:  Fair  Speech:  Clear and Coherent  Volume:  Normal  Mood:  improved  Affect:  pleasant  Thought Process:  Coherent  Orientation:  Full (Time, Place, and Person)  Thought Content:  Logical  Suicidal Thoughts:  No  Homicidal Thoughts:  No  Memory:  Immediate;   Fair Recent;   Fair Remote;   Fair  Judgement:  Fair  Insight:  Fair  Psychomotor Activity:  Normal  Concentration:  Concentration: Fair and Attention Span: Fair  Recall:  [employer] of Knowledge:Fair  Language: Fair  Akathisia:  No  Handed:  Right  AIMS (if indicated):  na  Assets:  Communication Skills Desire for Improvement Vocational/Educational  ADL's:  Intact  Cognition: WNL  Sleep:  better    Treatment Plan Summary:  Major Depressive Disorder, mild  Continue Zoloft at 100 mg, side effects discussed. Start seeing Riley Kill.  It was stressed that patient will benefit from therapy.  Generalized anxiety disorder Same as above  Insomnia Continue trazodone at 50 mg at bedtime., okay to take 2 tablets if she cannot fall asleep when she takes 1.  Return to clinic in 2 months  time or call before if needed  Elvin So, MD 12/4/201811:30 AM

## 2017-11-26 ENCOUNTER — Other Ambulatory Visit: Payer: Self-pay | Admitting: Psychiatry

## 2018-01-19 ENCOUNTER — Ambulatory Visit: Payer: BLUE CROSS/BLUE SHIELD | Admitting: Psychiatry

## 2018-01-19 ENCOUNTER — Encounter: Payer: Self-pay | Admitting: Psychiatry

## 2018-01-19 ENCOUNTER — Other Ambulatory Visit: Payer: Self-pay

## 2018-01-19 VITALS — BP 130/75 | HR 88 | Temp 98.6°F | Wt 271.4 lb

## 2018-01-19 DIAGNOSIS — F330 Major depressive disorder, recurrent, mild: Secondary | ICD-10-CM

## 2018-01-19 DIAGNOSIS — F411 Generalized anxiety disorder: Secondary | ICD-10-CM | POA: Diagnosis not present

## 2018-01-19 MED ORDER — TRAZODONE HCL 150 MG PO TABS: 150.0000 mg | ORAL_TABLET | Freq: Every day | ORAL | 1 refills | Status: DC

## 2018-01-19 MED ORDER — SERTRALINE HCL 100 MG PO TABS: 100.0000 mg | ORAL_TABLET | Freq: Every day | ORAL | 2 refills | Status: DC
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
## 2018-01-26 ENCOUNTER — Ambulatory Visit: Payer: BLUE CROSS/BLUE SHIELD | Admitting: Psychiatry

## 2018-02-08 ENCOUNTER — Telehealth: Payer: Self-pay

## 2018-02-08 NOTE — Telephone Encounter (Signed)
pt last seen on  01-19-18 . received fax requesting a 90 day supply of sertraline hcl 100mg  pt has appt in march 28th.   Order Providers   Prescribing Provider Encounter Provider  Elvin So, MD Elvin So, MD  Medication Detail    Disp Refills Start End   sertraline (ZOLOFT) 100 MG tablet 30 tablet 2 01/19/2018 01/19/2019   Sig - Route: Take 1 tablet (100 mg total) by mouth daily. - Oral   Sent to pharmacy as: sertraline (ZOLOFT) 100 MG tablet   E-Prescribing Status: Receipt confirmed by pharmacy (01/19/2018 10:31 AM EST)

## 2018-02-08 NOTE — Telephone Encounter (Signed)
Ok please call in

## 2018-02-09 NOTE — Telephone Encounter (Signed)
Message was left on doctor's line the ok for a 90 day supply of sertraline with no additional refills.

## 2018-03-18 ENCOUNTER — Encounter: Payer: Self-pay | Admitting: Psychiatry

## 2018-03-18 ENCOUNTER — Ambulatory Visit: Payer: BLUE CROSS/BLUE SHIELD | Admitting: Psychiatry

## 2018-03-18 ENCOUNTER — Other Ambulatory Visit: Payer: Self-pay

## 2018-03-18 VITALS — BP 125/78 | HR 73 | Temp 98.5°F | Wt 275.6 lb

## 2018-03-18 DIAGNOSIS — F411 Generalized anxiety disorder: Secondary | ICD-10-CM | POA: Diagnosis not present

## 2018-03-18 DIAGNOSIS — R5383 Other fatigue: Secondary | ICD-10-CM | POA: Diagnosis not present

## 2018-03-18 DIAGNOSIS — F330 Major depressive disorder, recurrent, mild: Secondary | ICD-10-CM | POA: Diagnosis not present

## 2018-03-18 MED ORDER — SERTRALINE HCL 100 MG PO TABS: 100.0000 mg | ORAL_TABLET | Freq: Every day | ORAL | 0 refills | Status: DC

## 2018-03-18 MED ORDER — TRAZODONE HCL 150 MG PO TABS: 150.0000 mg | ORAL_TABLET | Freq: Every day | ORAL | 0 refills | Status: DC

## 2018-03-18 NOTE — Progress Notes (Signed)
[facility] Progress Note  03/18/2018 5:11 PM Evelyn Flores  MRN:  [id number]  Chief Complaint: ' I feel tired.' Chief Complaint    Follow-up; Medication Refill     HPI: Evelyn Flores is a 32 year old Caucasian female, single, lives in [place] with her parents, employed, has a history of depression and anxiety, presented to the clinic today for a follow-up visit.  Patient used to follow up with Dr. Einar Grad here in clinic.  This is her first visit with Probation officer.  Patient reports she is currently doing well on the current medication regimen except for feeling fatigue or tiredness during the day.  Patient reports she continues to take Zoloft for her mood symptoms.  She denies any side effects at this time.  She denies any crying spells.  Patient reports sleep is good.  She takes trazodone 150 mg.  She reports work is going well.  She reports she is able to focus and concentrate at work.  She works at a [employer] since the past 2-1/2 years.  Reports she continues to struggle with some migraine headaches.  She reports she tried several medications to address the same.  She however reports currently taking magnesium prescribed by her provider.  She reports that has been more helpful and her migraines are less intense.   Visit Diagnosis:    ICD-10-CM   1. MDD (major depressive disorder), recurrent episode, mild (HCC) F33.0 sertraline (ZOLOFT) 100 MG tablet  2. GAD (generalized anxiety disorder) F41.1 sertraline (ZOLOFT) 100 MG tablet  3. Fatigue, unspecified type R53.83     Past Psychiatric History: Hx of depression and anxiety , was being treated by Dr[facility]Ravi in the past at [facility].Denies past hx of suicide attempts.  Past Medical History:  Past Medical History:  Diagnosis Date  . Anxiety   . Depression   . DVT (deep venous thrombosis) ([place])   . Ear infection     Past Surgical History:  Procedure Laterality Date  . CHOLECYSTECTOMY    . VEIN SURGERY Bilateral    vein surgery both legs     Family Psychiatric History: mother - hx of panic episode.  Family History:  Family History  Problem Relation Age of Onset  . Panic disorder Mother   . Dementia Paternal Grandfather     Social History: Lives in [place] with parents. Works at a [employer] examiner. Single. Has one sibling - sister has a good relationship with her.  Social History   Socioeconomic History  . Marital status: Single    Spouse name: Not on file  . Number of children: 0  . Years of education: Not on file  . Highest education level: High school graduate  Occupational History  . Occupation: sport endeavors    Comment: fulltime  Social Needs  . Financial resource strain: Not hard at all  . Food insecurity:    Worry: Never true    Inability: Never true  . Transportation needs:    Medical: No    Non-medical: No  Tobacco Use  . Smoking status: Never Smoker  . Smokeless tobacco: Never Used  Substance and Sexual Activity  . Alcohol use: No    Alcohol/week: 0.0 oz  . Drug use: No  . Sexual activity: Yes    Birth control/protection: Injection, Condom  Lifestyle  . Physical activity:    Days per week: 3 days    Minutes per session: 30 min  . Stress: Very much  Relationships  . Social connections:  Talks on phone: Once a week    Gets together: Once a week    Attends religious service: Never    Active member of club or organization: No    Attends meetings of clubs or organizations: Never    Relationship status: Never married  Other Topics Concern  . Not on file  Social History Narrative  . Not on file    Allergies:  Allergies  Allergen Reactions  . Influenza Vaccines Other (See Comments)    Very sick and was hospitalized   . Sulfa Antibiotics Swelling  . Topiramate Other (See Comments)    Mental fogginess, tingling, numbness    Metabolic Disorder Labs: No results found for: HGBA1C, MPG No results found for: PROLACTIN No results found for: CHOL, TRIG, HDL, CHOLHDL, VLDL,  LDLCALC No results found for: TSH  Therapeutic Level Labs: No results found for: LITHIUM No results found for: VALPROATE No components found for:  CBMZ  Current Medications: Current Outpatient Medications  Medication Sig Dispense Refill  . fluticasone (FLONASE) 50 MCG/ACT nasal spray Place 2 sprays into both nostrils daily.  12  . medroxyPROGESTERone (DEPO-PROVERA) 150 MG/ML injection Inject 150 mg into the muscle every 3 (three) months. Last injection June 2015    . omeprazole (PRILOSEC) 40 MG capsule Take 40 mg by mouth daily.    . potassium chloride SA (KLOR-CON M20) 20 MEQ tablet Take by mouth.    . sertraline (ZOLOFT) 100 MG tablet Take 1 tablet (100 mg total) by mouth daily. 90 tablet 0  . traZODone (DESYREL) 150 MG tablet Take 1 tablet (150 mg total) by mouth at bedtime. 90 tablet 0   No current facility-administered medications for this visit.      Musculoskeletal: Strength & Muscle Tone: within normal limits Gait & Station: normal Patient leans: N/A  Psychiatric Specialty Exam: Review of Systems  Psychiatric/Behavioral: Positive for depression (Improving). The patient is nervous/anxious (improving).   All other systems reviewed and are negative.   Blood pressure 125/78, pulse 73, temperature 98.5 F (36.9 C), temperature source Oral, weight 275 lb 9.6 oz (125 kg).Body mass index is 45.86 kg/m.  General Appearance: Casual  Eye Contact:  Fair  Speech:  Clear and Coherent  Volume:  Normal  Mood:  Dysphoric  Affect:  Congruent  Thought Process:  Goal Directed and Descriptions of Associations: Intact  Orientation:  Full (Time, Place, and Person)  Thought Content: Logical   Suicidal Thoughts:  No  Homicidal Thoughts:  No  Memory:  Immediate;   Fair Recent;   Fair Remote;   Fair  Judgement:  Fair  Insight:  Fair  Psychomotor Activity:  Normal  Concentration:  Concentration: Fair and Attention Span: Fair  Recall:  [employer] of Knowledge: Fair  Language: Fair   Akathisia:  No  Handed:  Right  AIMS (if indicated): NA  Assets:  Communication Skills Desire for [place] Support Talents/Skills Transportation Vocational/Educational  ADL's:  Intact  Cognition: WNL  Sleep:  Fair   Screenings: PHQ2-9     Nutrition from 05/07/2017 in [place]  PHQ-2 Total Score  0       Assessment and Plan: Lily is a 32 year old Caucasian female, single, employed, lives in [place], has a history of depression and anxiety, presented to the clinic today for a follow-up visit.  Patient used to follow up with Dr. Einar Grad in the past.  This is her first visit with Probation officer.  Patient reports she is  currently doing well on the current medications.  She struggles with some tiredness on and off.  Discussed changing the dosing time of her Zoloft to address it.  Also discussed being more active, exercise and so on.  Reviewed medical records in [facility] R Per Dr. Einar Grad.  Plan as noted below.  For MDD Zoloft 100 mg p.o. daily. Continue CBT PHQ 9 equals 1  For generalized anxiety disorder Continue Zoloft as prescribed. Gad 7 =3   For insomnia Continue trazodone 150 mg p.o. nightly  For fatigue Reviewed TSH in EHR done November 2018-within normal limits. Discussed changing the dosing time of her Zoloft.  Also discussed being more active, losing weight, exercise more.  I have reviewed medical records in [facility] Per Dr. Einar Grad.  Follow-up in clinic in 3 months or sooner if needed.   More than 50 % of the time was spent for psychoeducation and supportive psychotherapy and care coordination. This note was generated in part or whole with voice recognition software. Voice recognition is usually quite accurate but there are transcription errors that can and very often do occur. I apologize for any typographical errors that were not detected and corrected.       Ursula Alert, MD 03/18/2018, 5:11 PM

## 2018-04-26 ENCOUNTER — Ambulatory Visit: Payer: Self-pay | Admitting: Podiatry

## 2018-05-03 ENCOUNTER — Other Ambulatory Visit: Payer: Self-pay | Admitting: Podiatry

## 2018-05-03 ENCOUNTER — Encounter: Payer: Self-pay | Admitting: Podiatry

## 2018-05-03 ENCOUNTER — Ambulatory Visit: Payer: BLUE CROSS/BLUE SHIELD | Admitting: Podiatry

## 2018-05-03 ENCOUNTER — Ambulatory Visit (INDEPENDENT_AMBULATORY_CARE_PROVIDER_SITE_OTHER): Payer: BLUE CROSS/BLUE SHIELD

## 2018-05-03 DIAGNOSIS — M76822 Posterior tibial tendinitis, left leg: Secondary | ICD-10-CM

## 2018-05-03 DIAGNOSIS — M2142 Flat foot [pes planus] (acquired), left foot: Secondary | ICD-10-CM

## 2018-05-03 DIAGNOSIS — M2141 Flat foot [pes planus] (acquired), right foot: Secondary | ICD-10-CM | POA: Diagnosis not present

## 2018-05-03 DIAGNOSIS — M6740 Ganglion, unspecified site: Secondary | ICD-10-CM

## 2018-05-03 MED ORDER — METHYLPREDNISOLONE 4 MG PO TBPK: ORAL_TABLET | ORAL | 0 refills | Status: DC

## 2018-05-03 MED ORDER — MELOXICAM 15 MG PO TABS: 15.0000 mg | ORAL_TABLET | Freq: Every day | ORAL | 3 refills | Status: DC

## 2018-05-03 NOTE — Patient Instructions (Signed)
Flat Feet, Adult Normally, a foot has a curve, called an arch, on its inner side. The arch creates a gap between the foot and the ground. Flat feet is a common condition in which one or both feet do not have an arch. What are the causes? This condition may be caused by:  Failure of a normal arch to develop during childhood.  An injury to tendons and ligaments in the foot, such as to the tendon that supports the arch (posterior tibial tendon).  Loose tendons or ligaments in the foot.  A wearing down of the arch over time.  Injury to bones in the foot.  An abnormality in the bones of the foot, called tarsal coalition. This happens when two or more bones in the foot are joined together (fused) before birth.  What increases the risk? This condition is more likely to develop in:  Females.  Adults age 40 or older.  People who: ? Have a family history of flat feet. ? Have a history of childhood flexible flatfoot. ? Are obese. ? Have diabetes. ? Have high blood pressure. ? Participate in high-impact sports. ? Have inflammatory arthritis. ? Have a history of broken (fractured) or dislocated bones in the foot.  What are the signs or symptoms? Symptoms of this condition include:  Pain or tightness along the bottom of the foot.  Foot pain that gets worse with activity.  Swelling of the inner side of the foot.  Swelling of the ankle.  Pain on the outer side of the ankle.  Changes in the way that you walk (gait).  Pronation. This is when the foot and ankle lean inward when you are standing.  Bony bumps on the top or inner side of the foot.  How is this diagnosed? This condition is diagnosed with a physical exam of your foot and ankle. Your health care provider may also:  Look at your shoes for patterns of wear on the soles.  Order imaging tests, such as X-rays, a CT scan, or an MRI.  Refer you to a health care provider who specializes in feet (podiatrist) or a physical  therapist.  How is this treated? This condition may be treated with:  Stretching exercises or physical therapy. This helps to increase range of motion and relieve pain.  A shoe insert (orthotic). This helps to support the arch of your foot. Orthotics can be purchased from a store or can be custom-made by your health care provider.  Wearing shoes with appropriate arch support. This is especially important for athletes.  Medicines. These may be prescribed to relieve pain.  An ankle brace, boot, or cast. These may be used to relieve pressure on your foot. You may be given crutches if walking is painful.  Surgery. This may be done to improve the alignment of your foot. This is only needed if your posterior tibial tendon is torn or if you have tarsal coalition.  Follow these instructions at home: Activity  Do any exercises as told by your health care provider.  If an activity causes pain, avoid it or try to find another activity that does not cause pain. General instructions  Wear orthotics and appropriate shoes as told by your health care provider.  Take over-the-counter and prescription medicines only as told by your health care provider.  Wear an ankle brace, boot, or cast as told by your health care provider.  Use crutches as told by your health care provider.  Keep all follow-up visits as told   by your health care provider. This is important. How is this prevented? To prevent the condition from getting worse:  Wear comfortable, supportive shoes that are appropriate for your activities.  Maintain a healthy weight.  Stay active in a way that your health care provider recommends. This will help to keep your feet flexible and strong.  Manage long-term (chronic) health conditions, such as diabetes, high blood pressure, and inflammatory arthritis.  Work with a health care provider if you have concerns about your feet or shoes.  Contact a health care provider if:  You have  pain in your foot or lower leg that gets worse or does not improve with medicine.  You have pain or difficulty when walking.  You have problems with your orthotics. Summary  Flat feet is a common condition in which one or both feet do not have a curve, called an arch, on the inner side.  Your health care provider may recommend a shoe insert (orthotic) or shoes with the appropriate arch support.  Other treatments may include stretching exercises or physical therapy, medicines to relieve pain, and wearing an ankle brace, boot, or cast.  Surgery may be done if you have a tear in the tendon that supports your arch (posterior tibial tendon) or if two or more of your foot bones were joined together (fused)  before birth (tarsal coalition). This information is not intended to replace advice given to you by your health care provider. Make sure you discuss any questions you have with your health care provider. Document Released: 10/05/2009 Document Revised: 02/18/2017 Document Reviewed: 02/18/2017 Elsevier Interactive Patient Education  2018 Elsevier Inc.  

## 2018-05-03 NOTE — Progress Notes (Signed)
Subjective:  Patient ID: Evelyn Flores, female    DOB: [birth year],  MRN: [id number] HPI Chief Complaint  Patient presents with  . Foot Pain    Patient presents today for pain in her left foot/arch x 3-4 weeks.  She states "it feels like Im walking on pebbles"  She has tiny wart located on medial side of left arch.  She has taking Ibuprofen with no relief  . New Patient (Initial Visit)    32 y.o. female presents with the above complaint.:  She denies fever chills nausea vomiting muscle aches pains calf pain back pain chest pain shortness of breath.  Past Medical History:  Diagnosis Date  . Anxiety   . Depression   . DVT (deep venous thrombosis) ([place])   . Ear infection    Past Surgical History:  Procedure Laterality Date  . CHOLECYSTECTOMY    . VEIN SURGERY Bilateral    vein surgery both legs    Current Outpatient Medications:  .  acetaminophen (TYLENOL) 325 MG tablet, Take by mouth., Disp: , Rfl:  .  clonazePAM (KLONOPIN) 0.5 MG tablet, Take by mouth., Disp: , Rfl:  .  fluticasone (FLONASE) 50 MCG/ACT nasal spray, Place 2 sprays into both nostrils daily., Disp: , Rfl: 12 .  gabapentin (NEURONTIN) 100 MG capsule, TAKE 1 CAP AT NIGHT DAILY X1 WK THEN 2 CAPSULES AT NIGHT X1 WEEK THEN 3 CAPS NIGHTLY FOR ONE WEEK., Disp: , Rfl: 3 .  ibuprofen (ADVIL,MOTRIN) 600 MG tablet, TAKE 1 TABLET BY MOUTH THREE TIMES DAILY WITH A MEAL FOR 7 DAYS, Disp: , Rfl: 0 .  magnesium oxide (MAG-OX) 400 MG tablet, Take by mouth., Disp: , Rfl:  .  medroxyPROGESTERone (DEPO-PROVERA) 150 MG/ML injection, Inject 150 mg into the muscle every 3 (three) months. Last injection June 2015, Disp: , Rfl:  .  meloxicam (MOBIC) 15 MG tablet, Take 1 tablet (15 mg total) by mouth daily., Disp: 30 tablet, Rfl: 3 .  methylPREDNISolone (MEDROL DOSEPAK) 4 MG TBPK tablet, 6 day dose pack - take as directed, Disp: 21 tablet, Rfl: 0 .  omeprazole (PRILOSEC) 40 MG capsule, Take 40 mg by mouth daily., Disp: , Rfl:  .  potassium  chloride SA (KLOR-CON M20) 20 MEQ tablet, Take by mouth., Disp: , Rfl:  .  sertraline (ZOLOFT) 100 MG tablet, Take 1 tablet (100 mg total) by mouth daily., Disp: 90 tablet, Rfl: 0 .  SUMAtriptan (IMITREX) 20 MG/ACT nasal spray, 1 SPRAY INTO LEFT NOSTRIL ONCE AS NEEDED FOR MIGRAINE. MAY REPEAT AFTER 2 HOURS IF NEEDED., Disp: , Rfl: 6 .  traZODone (DESYREL) 150 MG tablet, Take 1 tablet (150 mg total) by mouth at bedtime., Disp: 90 tablet, Rfl: 0 .  triamterene-hydrochlorothiazide (MAXZIDE-25) 37.5-25 MG tablet, Take 0.5 tablets by mouth daily., Disp: , Rfl: 6  Allergies  Allergen Reactions  . Influenza Vaccines Other (See Comments)    Very sick and was hospitalized   . Sulfa Antibiotics Swelling  . Topiramate Other (See Comments)    Mental fogginess, tingling, numbness   Review of Systems Objective:  There were no vitals filed for this visit.  General: Well developed, nourished, in no acute distress, alert and oriented x3   Dermatological: Skin is warm, dry and supple bilateral. Nails x 10 are well maintained; remaining integument appears unremarkable at this time. There are no open sores, no preulcerative lesions, no rash or signs of infection present.  No open lesions or wounds are noted.  Vascular: Dorsalis Pedis artery and  Posterior Tibial artery pedal pulses are 2/4 bilateral with immedate capillary fill time. Pedal hair growth present. No varicosities and no lower extremity edema present bilateral.   Neruologic: Grossly intact via light touch bilateral. Vibratory intact via tuning fork bilateral. Protective threshold with Semmes Wienstein monofilament intact to all pedal sites bilateral. Patellar and Achilles deep tendon reflexes 2+ bilateral. No Babinski or clonus noted bilateral.   Musculoskeletal: No gross boney pedal deformities bilateral. No pain, crepitus, or limitation noted with foot and ankle range of motion bilateral. Muscular strength 5/5 in all groups tested bilateral.  At  this point she has been having pain on palpation medial aspect of the left foot and stands static with severe pronation.  Posterior tibial tendon dysfunction is most likely the culprit here.  Gait: Unassisted, Nonantalgic.    Radiographs:  No acute findings.  Pronated foot type no fractures identified.  Assessment & Plan:   Assessment: Posterior tibial tendon dysfunction left foot.   Plan: Discussed etiology pathology conservative versus surgical therapies at this point I want to see by getting her scanned for new set of orthotics and I injected the area today with Kenalog and local anesthetic.  Start her on a Medrol Dosepak to be followed by meloxicam.  20 mg was injected of Kenalog and 5 mg of Marcaine.  Tolerated procedure well after sterile Betadine skin prep.     Shareece Bultman T. [place], [place]

## 2018-05-19 ENCOUNTER — Ambulatory Visit (INDEPENDENT_AMBULATORY_CARE_PROVIDER_SITE_OTHER): Payer: BLUE CROSS/BLUE SHIELD | Admitting: Orthotics

## 2018-05-19 DIAGNOSIS — M76822 Posterior tibial tendinitis, left leg: Secondary | ICD-10-CM

## 2018-05-19 DIAGNOSIS — M2141 Flat foot [pes planus] (acquired), right foot: Secondary | ICD-10-CM

## 2018-05-19 DIAGNOSIS — M2142 Flat foot [pes planus] (acquired), left foot: Secondary | ICD-10-CM

## 2018-05-19 NOTE — Progress Notes (Signed)
Patient presents with history of Pes Planus with discomfort at palpation at navicular inserti  Plan is for CMFO w/ longitudinal arch support, rear foot stability to address eversion. Alslo a navicular sweet spot will be added.  Fabrication will be with semi rigid/rigid poly pro shell, deep heel seat, wide, padding under spenco cover. Plan on     EVERFEET                                   To fabricate.

## 2018-06-02 ENCOUNTER — Ambulatory Visit: Payer: BLUE CROSS/BLUE SHIELD | Admitting: Podiatry

## 2018-06-02 ENCOUNTER — Encounter: Payer: Self-pay | Admitting: Podiatry

## 2018-06-02 DIAGNOSIS — M76822 Posterior tibial tendinitis, left leg: Secondary | ICD-10-CM | POA: Diagnosis not present

## 2018-06-02 NOTE — Patient Instructions (Signed)

## 2018-06-02 NOTE — Progress Notes (Signed)
She presents today for follow-up of her PT TD states that is better but is still tender.  She is also here to pick up her orthotics.  States that is still tender right here she points to the inferior portion of the navicular tuberosity.  Objective evaluation: Vital signs are stable alert and oriented x3 much decrease in edema to the left foot mild tenderness on palpation of the posterior tibial tendon as it courses beneath the medial malleolus but the majority of the tenderness is located out over the navicular tuberosity.  She has tenderness on palpation of the tuberosity.  Assessment: Insertional posterior tibial tendinitis left.  Plan: After sterile Betadine skin prep I injected 2 mg of dexamethasone and local anesthetic to the point of maximal tenderness.  Tolerated procedure well she was dispensed orthotics she was given both oral and written home-going instruction for care and use of her orthotics and I will follow-up with her in a month or so.

## 2018-06-09 ENCOUNTER — Other Ambulatory Visit: Payer: BLUE CROSS/BLUE SHIELD | Admitting: Orthotics

## 2018-06-11 ENCOUNTER — Other Ambulatory Visit: Payer: Self-pay | Admitting: Psychiatry

## 2018-06-18 ENCOUNTER — Ambulatory Visit (INDEPENDENT_AMBULATORY_CARE_PROVIDER_SITE_OTHER): Payer: BLUE CROSS/BLUE SHIELD | Admitting: Psychiatry

## 2018-06-18 ENCOUNTER — Other Ambulatory Visit: Payer: Self-pay

## 2018-06-18 ENCOUNTER — Encounter: Payer: Self-pay | Admitting: Psychiatry

## 2018-06-18 VITALS — BP 129/78 | HR 86 | Temp 98.1°F | Wt 279.4 lb

## 2018-06-18 DIAGNOSIS — F411 Generalized anxiety disorder: Secondary | ICD-10-CM

## 2018-06-18 DIAGNOSIS — F330 Major depressive disorder, recurrent, mild: Secondary | ICD-10-CM

## 2018-06-18 MED ORDER — SERTRALINE HCL 100 MG PO TABS: 100.0000 mg | ORAL_TABLET | Freq: Every day | ORAL | 1 refills | Status: DC

## 2018-06-18 NOTE — Progress Notes (Signed)
[facility] MD OP Progress Note  06/18/2018 12:39 PM Evelyn Flores  MRN:  [id number]  Chief Complaint: ' I am here for follow up." Chief Complaint    Follow-up     HPI: Evelyn Flores is a 32 year old Caucasian female, single, lives in [place] with her parents, employed, has a history of depression, anxiety, presented to the clinic today for a follow-up visit.  Patient today reports she is currently struggling with some lower extremity pain.  She reports she is currently under the care of her providers who is investigating the same.  She also take pain medications for relief.  Patient otherwise reports her mood symptoms are stable.  She reports she is compliant with her Zoloft.  She denies any sadness or anxiety other than her concern about her pain.  Patient reports sleep as good.  She is tolerating the trazodone well.  Patient denies any suicidality.  Patient denies any perceptual disturbances.  Patient denies any substance abuse problems. Visit Diagnosis:    ICD-10-CM   1. MDD (major depressive disorder), recurrent episode, mild (HCC) F33.0    in partial remission  2. GAD (generalized anxiety disorder) F41.1 sertraline (ZOLOFT) 100 MG tablet    Past Psychiatric History: Have reviewed past psychiatric history from my progress note on 03/18/2018  Past Medical History:  Past Medical History:  Diagnosis Date  . Anxiety   . Depression   . DVT (deep venous thrombosis) ([place])   . Ear infection     Past Surgical History:  Procedure Laterality Date  . CHOLECYSTECTOMY    . VEIN SURGERY Bilateral    vein surgery both legs    Family Psychiatric History: I reviewed family psychiatric history from my progress note on 03/18/2018.  Family History:  Family History  Problem Relation Age of Onset  . Panic disorder Mother   . Dementia Paternal Grandfather    Substance abuse history: Denies  Social History: Reviewed social history from my progress note on 03/18/2018. Social History    Socioeconomic History  . Marital status: Single    Spouse name: Not on file  . Number of children: 0  . Years of education: Not on file  . Highest education level: High school graduate  Occupational History  . Occupation: sport endeavors    Comment: fulltime  Social Needs  . Financial resource strain: Not hard at all  . Food insecurity:    Worry: Never true    Inability: Never true  . Transportation needs:    Medical: No    Non-medical: No  Tobacco Use  . Smoking status: Never Smoker  . Smokeless tobacco: Never Used  Substance and Sexual Activity  . Alcohol use: No    Alcohol/week: 0.0 oz  . Drug use: No  . Sexual activity: Yes    Birth control/protection: Injection, Condom  Lifestyle  . Physical activity:    Days per week: 3 days    Minutes per session: 30 min  . Stress: Very much  Relationships  . Social connections:    Talks on phone: Once a week    Gets together: Once a week    Attends religious service: Never    Active member of club or organization: No    Attends meetings of clubs or organizations: Never    Relationship status: Never married  Other Topics Concern  . Not on file  Social History Narrative  . Not on file    Allergies:  Allergies  Allergen Reactions  . Influenza Vaccines Other (  See Comments)    Very sick and was hospitalized   . Sulfa Antibiotics Swelling  . Topiramate Other (See Comments)    Mental fogginess, tingling, numbness    Metabolic Disorder Labs: No results found for: HGBA1C, MPG No results found for: PROLACTIN No results found for: CHOL, TRIG, HDL, CHOLHDL, VLDL, LDLCALC No results found for: TSH  Therapeutic Level Labs: No results found for: LITHIUM No results found for: VALPROATE No components found for:  CBMZ  Current Medications: Current Outpatient Medications  Medication Sig Dispense Refill  . acetaminophen (TYLENOL) 325 MG tablet Take by mouth.    . clonazePAM (KLONOPIN) 0.5 MG tablet Take by mouth.    .  fluticasone (FLONASE) 50 MCG/ACT nasal spray Place 2 sprays into both nostrils daily.  12  . gabapentin (NEURONTIN) 100 MG capsule TAKE 1 CAP AT NIGHT DAILY X1 WK THEN 2 CAPSULES AT NIGHT X1 WEEK THEN 3 CAPS NIGHTLY FOR ONE WEEK.  3  . ibuprofen (ADVIL,MOTRIN) 600 MG tablet TAKE 1 TABLET BY MOUTH THREE TIMES DAILY WITH A MEAL FOR 7 DAYS  0  . magnesium oxide (MAG-OX) 400 MG tablet Take by mouth.    . medroxyPROGESTERone (DEPO-PROVERA) 150 MG/ML injection Inject 150 mg into the muscle every 3 (three) months. Last injection June 2015    . meloxicam (MOBIC) 15 MG tablet Take 1 tablet (15 mg total) by mouth daily. 30 tablet 3  . omeprazole (PRILOSEC) 40 MG capsule Take 40 mg by mouth daily.    . potassium chloride SA (KLOR-CON M20) 20 MEQ tablet Take by mouth.    . sertraline (ZOLOFT) 100 MG tablet Take 1 tablet (100 mg total) by mouth daily. 90 tablet 1  . SUMAtriptan (IMITREX) 20 MG/ACT nasal spray 1 SPRAY INTO LEFT NOSTRIL ONCE AS NEEDED FOR MIGRAINE. MAY REPEAT AFTER 2 HOURS IF NEEDED.  6  . traZODone (DESYREL) 150 MG tablet TAKE 1 TABLET (150 MG TOTAL) BY MOUTH AT BEDTIME. 90 tablet 0  . triamterene-hydrochlorothiazide (MAXZIDE-25) 37.5-25 MG tablet Take 0.5 tablets by mouth daily.  6   No current facility-administered medications for this visit.      Musculoskeletal: Strength & Muscle Tone: within normal limits Gait & Station: normal Patient leans: N/A  Psychiatric Specialty Exam: Review of Systems  Musculoskeletal: Positive for joint pain.  Psychiatric/Behavioral: The patient is nervous/anxious.   All other systems reviewed and are negative.   Blood pressure 129/78, pulse 86, temperature 98.1 F (36.7 C), temperature source Oral, weight 279 lb 6.4 oz (126.7 kg).Body mass index is 46.49 kg/m.  General Appearance: Casual  Eye Contact:  Fair  Speech:  Clear and Coherent  Volume:  Normal  Mood:  Anxious  Affect:  Congruent  Thought Process:  Goal Directed and Descriptions of  Associations: Intact  Orientation:  Full (Time, Place, and Person)  Thought Content: Logical   Suicidal Thoughts:  No  Homicidal Thoughts:  No  Memory:  Immediate;   Fair Recent;   Fair Remote;   Fair  Judgement:  Fair  Insight:  Fair  Psychomotor Activity:  Normal  Concentration:  Concentration: Fair and Attention Span: Fair  Recall:  [employer] of Knowledge: Fair  Language: Fair  Akathisia:  No  Handed:  Right  AIMS (if indicated): na  Assets:  Communication Skills Desire for Improvement Social Support  ADL's:  Intact  Cognition: WNL  Sleep:  restless due to pain.   Screenings: PHQ2-9     Nutrition from 05/07/2017 in [facility]  PHQ-2 Total Score  0       Assessment and Plan: Shemaiah is a 32 year old Caucasian female, single, employed, lives in [place], has a history of depression, anxiety, presented to the clinic today for a follow-up visit.  Patient today reports she is currently having lower extremity pain which is currently being managed by her providers.  She otherwise reports mood as stable.  We will continue medications as noted below.  Plan For MDD Zoloft 100 mg p.o. daily   For generalized anxiety disorder Continue Zoloft as prescribed.  Insomnia Continue trazodone 150 mg p.o. Nightly.  Continue to follow-up with her providers for her lower extremity pain.  Follow-up in clinic in 3 months  More than 50 % of the time was spent for psychoeducation and supportive psychotherapy and care coordination.  This note was generated in part or whole with voice recognition software. Voice recognition is usually quite accurate but there are transcription errors that can and very often do occur. I apologize for any typographical errors that were not detected and corrected.       Ursula Alert, MD 06/18/2018, 12:39 PM

## 2018-07-12 ENCOUNTER — Ambulatory Visit: Payer: BLUE CROSS/BLUE SHIELD | Admitting: Podiatry

## 2018-07-12 ENCOUNTER — Encounter: Payer: Self-pay | Admitting: Podiatry

## 2018-07-12 DIAGNOSIS — M76822 Posterior tibial tendinitis, left leg: Secondary | ICD-10-CM

## 2018-07-12 NOTE — Progress Notes (Signed)
She presents today states that her orthotics are doing great and her posterior tibial tendon is feeling so much better she is very happy with the outcome.  Objective: Vital signs are stable she is alert and oriented x3 no reproducible pain on palpation of the posterior tibial tendon today.  Pulses remain palpable neurologic sensorium is intact deep tendon reflexes are intact no open lesions or wounds are noted.  There is a small bruise appears to be a ruptured varicosity medial aspect of the foot.  Assessment: Pain in limb due to posterior tibial tendinitis is resolving left.  Plan: Continue current therapies including the use of her orthotics.

## 2018-09-07 ENCOUNTER — Other Ambulatory Visit: Payer: Self-pay | Admitting: Podiatry

## 2018-09-07 ENCOUNTER — Other Ambulatory Visit: Payer: Self-pay | Admitting: Psychiatry

## 2018-10-11 ENCOUNTER — Encounter: Payer: Self-pay | Admitting: Psychiatry

## 2018-10-11 ENCOUNTER — Other Ambulatory Visit: Payer: Self-pay

## 2018-10-11 ENCOUNTER — Ambulatory Visit (INDEPENDENT_AMBULATORY_CARE_PROVIDER_SITE_OTHER): Payer: BLUE CROSS/BLUE SHIELD | Admitting: Psychiatry

## 2018-10-11 VITALS — BP 117/76 | HR 76 | Temp 99.5°F | Wt 277.8 lb

## 2018-10-11 DIAGNOSIS — F3342 Major depressive disorder, recurrent, in full remission: Secondary | ICD-10-CM | POA: Diagnosis not present

## 2018-10-11 DIAGNOSIS — F411 Generalized anxiety disorder: Secondary | ICD-10-CM | POA: Diagnosis not present

## 2018-10-11 NOTE — Progress Notes (Signed)
[facility] MD OP Progress Note  10/11/2018 1:24 PM Evelyn Flores  MRN:  [id number]  Chief Complaint: ' I am here for follow up." Chief Complaint    Follow-up; Medication Refill     HPI: Evelyn Flores is a 32 yr old Caucasian female, single, lives in [place] with her parents, employed, has a history of depression, anxiety, presented to the clinic today for a follow-up visit.  Patient today reports she is improving with regards to her mood symptoms.  She is compliant with her Zoloft as well as trazodone as prescribed.  She denies any significant sadness, crying spells, sleep problems or anxiety problems.  Patient continues to struggle with pain.  She is working with her pain providers to figure out further management.  She reports that as a stressor for her.  Patient otherwise denies any other concerns today.   Visit Diagnosis:    ICD-10-CM   1. MDD (major depressive disorder), recurrent, in full remission ([place]) F33.42   2. GAD (generalized anxiety disorder) F41.1    improving    Past Psychiatric History: Reviewed past psychiatric history from my progress note on 03/18/2018  Past Medical History:  Past Medical History:  Diagnosis Date  . Anxiety   . Depression   . DVT (deep venous thrombosis) ([place])   . Ear infection     Past Surgical History:  Procedure Laterality Date  . CHOLECYSTECTOMY    . VEIN SURGERY Bilateral    vein surgery both legs    Family Psychiatric History: Have reviewed family psychiatric history from my progress note on 03/18/2018  Family History:  Family History  Problem Relation Age of Onset  . Panic disorder Mother   . Dementia Paternal Grandfather     Social History: Have reviewed social history from my progress note on 03/18/2018 Social History   Socioeconomic History  . Marital status: Single    Spouse name: Not on file  . Number of children: 0  . Years of education: Not on file  . Highest education level: High school graduate  Occupational History   . Occupation: sport endeavors    Comment: fulltime  Social Needs  . Financial resource strain: Not hard at all  . Food insecurity:    Worry: Never true    Inability: Never true  . Transportation needs:    Medical: No    Non-medical: No  Tobacco Use  . Smoking status: Never Smoker  . Smokeless tobacco: Never Used  Substance and Sexual Activity  . Alcohol use: No    Alcohol/week: 0.0 standard drinks  . Drug use: No  . Sexual activity: Yes    Birth control/protection: Injection, Condom  Lifestyle  . Physical activity:    Days per week: 3 days    Minutes per session: 30 min  . Stress: Very much  Relationships  . Social connections:    Talks on phone: Once a week    Gets together: Once a week    Attends religious service: Never    Active member of club or organization: No    Attends meetings of clubs or organizations: Never    Relationship status: Never married  Other Topics Concern  . Not on file  Social History Narrative  . Not on file    Allergies:  Allergies  Allergen Reactions  . Influenza Vaccines Other (See Comments)    Very sick and was hospitalized   . Sulfa Antibiotics Swelling  . Topiramate Other (See Comments)    Mental fogginess,  tingling, numbness    Metabolic Disorder Labs: No results found for: HGBA1C, MPG No results found for: PROLACTIN No results found for: CHOL, TRIG, HDL, CHOLHDL, VLDL, LDLCALC No results found for: TSH  Therapeutic Level Labs: No results found for: LITHIUM No results found for: VALPROATE No components found for:  CBMZ  Current Medications: Current Outpatient Medications  Medication Sig Dispense Refill  . acetaminophen (TYLENOL) 325 MG tablet Take by mouth.    . baclofen (LIORESAL) 10 MG tablet TAKE 1 TABLET BY MOUTH 2 TIMES DAILY AS NEEDED  3  . fluticasone (FLONASE) 50 MCG/ACT nasal spray Place 2 sprays into both nostrils daily.  12  . ibuprofen (ADVIL,MOTRIN) 600 MG tablet TAKE 1 TABLET BY MOUTH THREE TIMES DAILY  WITH A MEAL FOR 7 DAYS  0  . magnesium oxide (MAG-OX) 400 MG tablet Take by mouth.    . medroxyPROGESTERone (DEPO-PROVERA) 150 MG/ML injection Inject 150 mg into the muscle every 3 (three) months. Last injection June 2015    . meloxicam (MOBIC) 15 MG tablet TAKE 1 TABLET BY MOUTH EVERY DAY 30 tablet 3  . omeprazole (PRILOSEC) 40 MG capsule Take 40 mg by mouth daily.    . potassium chloride SA (KLOR-CON M20) 20 MEQ tablet Take by mouth.    . sertraline (ZOLOFT) 100 MG tablet Take 1 tablet (100 mg total) by mouth daily. 90 tablet 1  . SUMAtriptan (IMITREX) 20 MG/ACT nasal spray 1 SPRAY INTO LEFT NOSTRIL ONCE AS NEEDED FOR MIGRAINE. MAY REPEAT AFTER 2 HOURS IF NEEDED.  6  . traZODone (DESYREL) 150 MG tablet TAKE 1 TABLET (150 MG TOTAL) BY MOUTH AT BEDTIME. 90 tablet 0  . triamterene-hydrochlorothiazide (MAXZIDE-25) 37.5-25 MG tablet Take 0.5 tablets by mouth daily.  6  . aspirin-acetaminophen-caffeine (EXCEDRIN MIGRAINE) 250-250-65 MG tablet Take by mouth.    . gabapentin (NEURONTIN) 300 MG capsule   1   No current facility-administered medications for this visit.      Musculoskeletal: Strength & Muscle Tone: within normal limits Gait & Station: normal Patient leans: N/A  Psychiatric Specialty Exam: Review of Systems  Psychiatric/Behavioral: Positive for depression (improved).  All other systems reviewed and are negative.   Blood pressure 117/76, pulse 76, temperature 99.5 F (37.5 C), temperature source Oral, weight 277 lb 12.8 oz (126 kg).Body mass index is 46.23 kg/m.  General Appearance: Casual  Eye Contact:  Fair  Speech:  Clear and Coherent  Volume:  Normal  Mood:  Euthymic  Affect:  Congruent  Thought Process:  Goal Directed and Descriptions of Associations: Intact  Orientation:  Full (Time, Place, and Person)  Thought Content: Logical   Suicidal Thoughts:  No  Homicidal Thoughts:  No  Memory:  Immediate;   Fair Recent;   Fair Remote;   Fair  Judgement:  Fair   Insight:  Fair  Psychomotor Activity:  Normal  Concentration:  Concentration: Fair and Attention Span: Fair  Recall:  [employer] of Knowledge: Fair  Language: Fair  Akathisia:  No  Handed:  Left  AIMS (if indicated): na  Assets:  Communication Skills Desire for Improvement Social Support  ADL's:  Intact  Cognition: WNL  Sleep:  improved   Screenings: PHQ2-9     Nutrition from 05/07/2017 in [place]  PHQ-2 Total Score  0       Assessment and Plan: Evelyn Flores is a 32 year old Caucasian female, single, employed, lives in [place], has a history of depression, anxiety, presented to the clinic today  for a follow-up visit.  Patient reports she is currently improving on the current medication regimen.  She continues to have comorbid pain which is a stressor for her.  She is currently working with her providers for managing her pain.  Continue plan as noted below.  Plan For MDD in remission Zoloft 100 mg p.o. daily PHQ 9 equals 1  For GAD Zoloft as prescribed GAD 7 equals 2  For insomnia Trazodone 150 mg p.o. nightly  Patient will continue to follow-up with her pain provider for her chronic pain.  Follow-up in clinic in 3-4 months or sooner if needed.  More than 50 % of the time was spent for psychoeducation and supportive psychotherapy and care coordination.  This note was generated in part or whole with voice recognition software. Voice recognition is usually quite accurate but there are transcription errors that can and very often do occur. I apologize for any typographical errors that were not detected and corrected.       Ursula Alert, MD 10/11/2018, 1:24 PM

## 2018-12-17 ENCOUNTER — Other Ambulatory Visit: Payer: Self-pay | Admitting: Psychiatry

## 2019-01-11 ENCOUNTER — Other Ambulatory Visit: Payer: Self-pay

## 2019-01-11 ENCOUNTER — Encounter: Payer: Self-pay | Admitting: Psychiatry

## 2019-01-11 ENCOUNTER — Ambulatory Visit (HOSPITAL_BASED_OUTPATIENT_CLINIC_OR_DEPARTMENT_OTHER): Payer: 59 | Admitting: Psychiatry

## 2019-01-11 VITALS — BP 146/81 | HR 82 | Temp 98.2°F | Wt 293.4 lb

## 2019-01-11 DIAGNOSIS — Z634 Disappearance and death of family member: Secondary | ICD-10-CM | POA: Diagnosis not present

## 2019-01-11 DIAGNOSIS — F3342 Major depressive disorder, recurrent, in full remission: Secondary | ICD-10-CM | POA: Diagnosis not present

## 2019-01-11 DIAGNOSIS — F411 Generalized anxiety disorder: Secondary | ICD-10-CM

## 2019-01-11 MED ORDER — SERTRALINE HCL 100 MG PO TABS: 100.0000 mg | ORAL_TABLET | Freq: Every day | ORAL | 1 refills | Status: DC

## 2019-01-11 NOTE — Progress Notes (Signed)
[facility] MD OP Progress Note  01/11/2019 5:44 PM ALABAMA DOIG  MRN:  [id number]  Chief Complaint: ' I am here for follow up.' Chief Complaint    Follow-up; Medication Refill     HPI: Evelyn Flores is a 33 year old Caucasian female, single, lives at [place] with her parents, employed, has a history of depression, anxiety, presented to the clinic today for a follow-up visit.  Patient today reports her paternal grandfather passed away recently.  She reports she is continuing to grieve however is able to cope with it.  Patient reports her mood symptoms as stable on the current medication regimen.  She is tolerating the Zoloft and the trazodone well.  Patient denies any suicidality.  Patient denies any perceptual disturbances.  She reports her pain is better now and she continues to have physical therapy.  Patient denies any other concerns today. Visit Diagnosis:    ICD-10-CM   1. MDD (major depressive disorder), recurrent, in full remission ([place]) F33.42   2. GAD (generalized anxiety disorder) F41.1 sertraline (ZOLOFT) 100 MG tablet  3. Bereavement Z63.4     Past Psychiatric History: I have reviewed past psychiatric history from my progress note on 03/18/2018.  Past Medical History:  Past Medical History:  Diagnosis Date  . Anxiety   . Depression   . DVT (deep venous thrombosis) ([place])   . Ear infection     Past Surgical History:  Procedure Laterality Date  . CHOLECYSTECTOMY    . VEIN SURGERY Bilateral    vein surgery both legs    Family Psychiatric History: Reviewed family psychiatric history from my progress note on 03/18/2018.  Family History:  Family History  Problem Relation Age of Onset  . Panic disorder Mother   . Dementia Paternal Grandfather     Social History: Reviewed social history from my progress note on 03/18/2018. Social History   Socioeconomic History  . Marital status: Single    Spouse name: Not on file  . Number of children: 0  . Years of education: Not on  file  . Highest education level: High school graduate  Occupational History  . Occupation: sport endeavors    Comment: fulltime  Social Needs  . Financial resource strain: Not hard at all  . Food insecurity:    Worry: Never true    Inability: Never true  . Transportation needs:    Medical: No    Non-medical: No  Tobacco Use  . Smoking status: Never Smoker  . Smokeless tobacco: Never Used  Substance and Sexual Activity  . Alcohol use: No    Alcohol/week: 0.0 standard drinks  . Drug use: No  . Sexual activity: Yes    Birth control/protection: Injection, Condom  Lifestyle  . Physical activity:    Days per week: 3 days    Minutes per session: 30 min  . Stress: Very much  Relationships  . Social connections:    Talks on phone: Once a week    Gets together: Once a week    Attends religious service: Never    Active member of club or organization: No    Attends meetings of clubs or organizations: Never    Relationship status: Never married  Other Topics Concern  . Not on file  Social History Narrative  . Not on file    Allergies:  Allergies  Allergen Reactions  . Influenza Vaccines Other (See Comments)    Very sick and was hospitalized   . Sulfa Antibiotics Swelling  . Topiramate Other (  See Comments)    Mental fogginess, tingling, numbness    Metabolic Disorder Labs: No results found for: HGBA1C, MPG No results found for: PROLACTIN No results found for: CHOL, TRIG, HDL, CHOLHDL, VLDL, LDLCALC No results found for: TSH  Therapeutic Level Labs: No results found for: LITHIUM No results found for: VALPROATE No components found for:  CBMZ  Current Medications: Current Outpatient Medications  Medication Sig Dispense Refill  . acetaminophen (TYLENOL) 325 MG tablet Take by mouth.    Marland Kitchen aspirin-acetaminophen-caffeine (EXCEDRIN MIGRAINE) 250-250-65 MG tablet Take by mouth.    . baclofen (LIORESAL) 10 MG tablet TAKE 1 TABLET BY MOUTH 2 TIMES DAILY AS NEEDED  3  .  fluticasone (FLONASE) 50 MCG/ACT nasal spray Place 2 sprays into both nostrils daily.  12  . gabapentin (NEURONTIN) 300 MG capsule   1  . ibuprofen (ADVIL,MOTRIN) 600 MG tablet TAKE 1 TABLET BY MOUTH THREE TIMES DAILY WITH A MEAL FOR 7 DAYS  0  . magnesium oxide (MAG-OX) 400 MG tablet Take by mouth.    . medroxyPROGESTERone (DEPO-PROVERA) 150 MG/ML injection Inject 150 mg into the muscle every 3 (three) months. Last injection June 2015    . meloxicam (MOBIC) 15 MG tablet TAKE 1 TABLET BY MOUTH EVERY DAY 30 tablet 3  . omeprazole (PRILOSEC) 40 MG capsule Take 40 mg by mouth daily.    . potassium chloride SA (KLOR-CON M20) 20 MEQ tablet Take by mouth.    . pregabalin (LYRICA) 75 MG capsule Take by mouth.    . sertraline (ZOLOFT) 100 MG tablet Take 1 tablet (100 mg total) by mouth daily. 90 tablet 1  . SUMAtriptan (IMITREX) 20 MG/ACT nasal spray 1 SPRAY INTO LEFT NOSTRIL ONCE AS NEEDED FOR MIGRAINE. MAY REPEAT AFTER 2 HOURS IF NEEDED.  6  . traZODone (DESYREL) 150 MG tablet TAKE 1 TABLET (150 MG TOTAL) BY MOUTH AT BEDTIME. 90 tablet 0  . triamterene-hydrochlorothiazide (MAXZIDE-25) 37.5-25 MG tablet Take 0.5 tablets by mouth daily.  6   No current facility-administered medications for this visit.      Musculoskeletal: Strength & Muscle Tone: within normal limits Gait & Station: normal Patient leans: N/A  Psychiatric Specialty Exam: Review of Systems  Psychiatric/Behavioral: Negative for depression. The patient is not nervous/anxious.   All other systems reviewed and are negative.   Blood pressure (!) 146/81, pulse 82, temperature 98.2 F (36.8 C), temperature source Oral, weight 293 lb 6.4 oz (133.1 kg).Body mass index is 48.82 kg/m.  General Appearance: Casual  Eye Contact:  Fair  Speech:  Clear and Coherent  Volume:  Normal  Mood:  Euthymic  Affect:  Congruent  Thought Process:  Goal Directed and Descriptions of Associations: Intact  Orientation:  Full (Time, Place, and Person)   Thought Content: Logical   Suicidal Thoughts:  No  Homicidal Thoughts:  No  Memory:  Immediate;   Fair Recent;   Fair Remote;   Fair  Judgement:  Fair  Insight:  Fair  Psychomotor Activity:  Normal  Concentration:  Concentration: Fair and Attention Span: Fair  Recall:  [employer] of Knowledge: Fair  Language: Fair  Akathisia:  No  Handed:  Right  AIMS (if indicated): Denies tremors, rigidity, stiffness  Assets:  Communication Skills Desire for Improvement  ADL's:  Intact  Cognition: WNL  Sleep:  Fair   Screenings: PHQ2-9     Nutrition from 05/07/2017 in [place]  PHQ-2 Total Score  0  Assessment and Plan: Evelyn Flores is a 33 year old Caucasian female, single, employed, lives in [place], has a history of depression, anxiety, presented to the clinic today for a follow-up visit.  Patient is making progress on the current medication regimen.  She is currently going through grief however is able to cope.  Will continue plan as noted below.  Plan For bereavement Provided grief counseling.  For GAD- stable Continue Zoloft as prescribed  For MDD in remission Zoloft as prescribed  For insomnia-stable Trazodone 150 mg p.o. nightly  Discussed with patient to reach out to her therapist if she continues to struggle with her grief.  She agrees with plan.  Follow-up in clinic in 5 to 6 months or sooner if needed.  I have spent atleast 15 minutes face to face with patient today. More than 50 % of the time was spent for psychoeducation and supportive psychotherapy and care coordination.  This note was generated in part or whole with voice recognition software. Voice recognition is usually quite accurate but there are transcription errors that can and very often do occur. I apologize for any typographical errors that were not detected and corrected.       Ursula Alert, MD 01/11/2019, 5:44 PM

## 2019-01-26 ENCOUNTER — Encounter: Payer: Self-pay | Admitting: Emergency Medicine

## 2019-01-26 ENCOUNTER — Other Ambulatory Visit: Payer: Self-pay

## 2019-01-26 ENCOUNTER — Emergency Department
Admission: EM | Admit: 2019-01-26 | Discharge: 2019-01-27 | Disposition: A | Discharge status: Home / Self Care | Payer: Managed Care, Other (non HMO) | Attending: Emergency Medicine | Admitting: Emergency Medicine

## 2019-01-26 DIAGNOSIS — Z9049 Acquired absence of other specified parts of digestive tract: Secondary | ICD-10-CM | POA: Diagnosis not present

## 2019-01-26 DIAGNOSIS — F419 Anxiety disorder, unspecified: Secondary | ICD-10-CM | POA: Insufficient documentation

## 2019-01-26 DIAGNOSIS — Z79899 Other long term (current) drug therapy: Secondary | ICD-10-CM | POA: Diagnosis not present

## 2019-01-26 DIAGNOSIS — F329 Major depressive disorder, single episode, unspecified: Secondary | ICD-10-CM | POA: Insufficient documentation

## 2019-01-26 DIAGNOSIS — R1031 Right lower quadrant pain: Secondary | ICD-10-CM | POA: Diagnosis present

## 2019-01-26 DIAGNOSIS — N730 Acute parametritis and pelvic cellulitis: Secondary | ICD-10-CM

## 2019-01-26 DIAGNOSIS — Z7982 Long term (current) use of aspirin: Secondary | ICD-10-CM | POA: Diagnosis not present

## 2019-01-26 DIAGNOSIS — N739 Female pelvic inflammatory disease, unspecified: Secondary | ICD-10-CM | POA: Insufficient documentation

## 2019-01-26 LAB — COMPREHENSIVE METABOLIC PANEL
ALT: 41 U/L (ref 0–44)
AST: 23 U/L (ref 15–41)
Albumin: 4.3 g/dL (ref 3.5–5.0)
Alkaline Phosphatase: 42 U/L (ref 38–126)
Anion gap: 6 (ref 5–15)
BUN: 13 mg/dL (ref 6–20)
CO2: 24 mmol/L (ref 22–32)
Calcium: 9.2 mg/dL (ref 8.9–10.3)
Chloride: 106 mmol/L (ref 98–111)
Creatinine, Ser: 0.75 mg/dL (ref 0.44–1.00)
GFR calc Af Amer: >60 mL/min (ref >60)
GFR calc non Af Amer: >60 mL/min (ref >60)
Glucose, Bld: 100 mg/dL — ABNORMAL HIGH (ref 70–99)
Potassium: 3.3 mmol/L — ABNORMAL LOW (ref 3.5–5.1)
Sodium: 136 mmol/L (ref 135–145)
Total Bilirubin: 0.6 mg/dL (ref 0.3–1.2)
Total Protein: 7.4 g/dL (ref 6.5–8.1)

## 2019-01-26 LAB — URINALYSIS, COMPLETE (UACMP) WITH MICROSCOPIC
Bilirubin Urine: NEGATIVE
Glucose, UA: NEGATIVE mg/dL
Hgb urine dipstick: NEGATIVE
Ketones, ur: NEGATIVE mg/dL
Leukocytes, UA: NEGATIVE
Nitrite: NEGATIVE
Protein, ur: NEGATIVE mg/dL
Specific Gravity, Urine: 1.004 — ABNORMAL LOW (ref 1.005–1.030)
pH: 7 (ref 5.0–8.0)

## 2019-01-26 LAB — CBC
HCT: 39 % (ref 36.0–46.0)
Hemoglobin: 13.4 g/dL (ref 12.0–15.0)
MCH: 28.8 pg (ref 26.0–34.0)
MCHC: 34.4 g/dL (ref 30.0–36.0)
MCV: 83.9 fL (ref 80.0–100.0)
Platelets: 228 10*3/uL (ref 150–400)
RBC: 4.65 MIL/uL (ref 3.87–5.11)
RDW: 12.6 % (ref 11.5–15.5)
WBC: 9.1 10*3/uL (ref 4.0–10.5)
nRBC: 0 % (ref 0.0–0.2)

## 2019-01-26 LAB — LIPASE, BLOOD: Lipase: 31 U/L (ref 11–51)

## 2019-01-26 LAB — POCT PREGNANCY, URINE: Preg Test, Ur: NEGATIVE

## 2019-01-26 MED ORDER — ONDANSETRON HCL 4 MG/2ML IJ SOLN
INTRAMUSCULAR | Status: AC
  Administered 2019-01-27: 4 mg via INTRAVENOUS
  Filled 2019-01-26: qty 2

## 2019-01-26 MED ORDER — MORPHINE SULFATE (PF) 4 MG/ML IV SOLN
INTRAVENOUS | Status: AC
  Administered 2019-01-27: 4 mg via INTRAVENOUS
  Filled 2019-01-26: qty 1

## 2019-01-26 MED ORDER — SODIUM CHLORIDE 0.9% FLUSH: 3.0000 mL | Freq: Once | INTRAVENOUS | Status: DC

## 2019-01-26 NOTE — ED Triage Notes (Signed)
Pt presents to ED with reoccurring lower right sided abd pain for the past couple of weeks. Pt states pain usually only lasts for a minute or two at a time but this time pain started around 2000 and has not stopped. Denies constipation or urinary symptoms. Was seen last Tuesday for the same by obgyn and had pelvic ultrasound performed. Results were negative.

## 2019-01-27 ENCOUNTER — Emergency Department: Payer: Managed Care, Other (non HMO)

## 2019-01-27 LAB — WET PREP, GENITAL
Clue Cells Wet Prep HPF POC: NONE SEEN
Sperm: NONE SEEN
Trich, Wet Prep: NONE SEEN
Yeast Wet Prep HPF POC: NONE SEEN

## 2019-01-27 LAB — CHLAMYDIA/NGC RT PCR (ARMC ONLY)
Chlamydia Tr: NOT DETECTED
N gonorrhoeae: NOT DETECTED

## 2019-01-27 MED ORDER — CEFTRIAXONE SODIUM 250 MG IJ SOLR
250.0000 mg | Freq: Once | INTRAMUSCULAR | Status: AC
  Administered 2019-01-27: 250 mg via INTRAMUSCULAR
  Filled 2019-01-27: qty 250

## 2019-01-27 MED ORDER — DOXYCYCLINE HYCLATE 100 MG PO CAPS: 100.0000 mg | ORAL_CAPSULE | Freq: Two times a day (BID) | ORAL | 0 refills | Status: AC

## 2019-01-27 MED ORDER — AZITHROMYCIN 500 MG PO TABS
1000.0000 mg | ORAL_TABLET | Freq: Once | ORAL | Status: AC
  Administered 2019-01-27: 1000 mg via ORAL
  Filled 2019-01-27: qty 2

## 2019-01-27 MED ORDER — OXYCODONE-ACETAMINOPHEN 5-325 MG PO TABS: 8.0000 | ORAL_TABLET | Freq: Four times a day (QID) | ORAL | 0 refills | Status: AC | PRN

## 2019-01-27 MED ORDER — ONDANSETRON HCL 4 MG/2ML IJ SOLN
4.0000 mg | Freq: Once | INTRAMUSCULAR | Status: AC
  Administered 2019-01-27: 4 mg via INTRAVENOUS

## 2019-01-27 MED ORDER — MORPHINE SULFATE (PF) 4 MG/ML IV SOLN
4.0000 mg | Freq: Once | INTRAVENOUS | Status: AC
  Administered 2019-01-27: 4 mg via INTRAVENOUS

## 2019-01-27 MED ORDER — DOXYCYCLINE HYCLATE 100 MG PO TABS
100.0000 mg | ORAL_TABLET | Freq: Once | ORAL | Status: AC
  Administered 2019-01-27: 100 mg via ORAL
  Filled 2019-01-27: qty 1

## 2019-01-27 MED ORDER — IOPAMIDOL (ISOVUE-300) INJECTION 61%
125.0000 mL | Freq: Once | INTRAVENOUS | Status: AC | PRN
  Administered 2019-01-27: 125 mL via INTRAVENOUS

## 2019-01-27 NOTE — ED Provider Notes (Signed)
[facility] Emergency Department Provider Note  ____________________________________________   First MD Initiated Contact with Patient 01/27/19 0005     (approximate)  I have reviewed the triage vital signs and the nursing notes.   HISTORY  Chief Complaint Abdominal Pain   HPI Evelyn Flores is a 33 y.o. female with below list of chronic medical conditions presents to the emergency department with 10 out of 10 right lower quadrant/pelvic pain for approximately 4 weeks.  Patient states that the pain worsened today since 8:00 PM.  Patient was seen by OB/GYN and states that she had a pelvic ultrasound performed that was negative.  Patient denies any fever no vomiting however does admit to nausea.  Patient denies any diarrhea constipation.  Patient denies any urinary symptoms.   Past Medical History:  Diagnosis Date  . Anxiety   . Depression   . DVT (deep venous thrombosis) ([place])   . Ear infection     Patient Active Problem List   Diagnosis Date Noted  . MDD (major depressive disorder), recurrent episode, mild ([place]) 03/18/2018  . Varicose veins 02/18/2017  . Venous insufficiency 12/05/2015  . Chronic deep vein thrombosis (DVT) of femoral vein of right lower extremity ([place]) 11/01/2015  . Gastroesophageal reflux disease without esophagitis 11/01/2015  . Leg swelling 11/01/2015  . PCOS (polycystic ovarian syndrome) 11/01/2015  . Obesity, Class III, BMI 40-49.9 (morbid obesity) ([place]) 11/01/2015  . Allergic rhinitis due to pollen 11/01/2015  . Sensorineural hearing loss of both ears 11/01/2015  . Tinnitus 11/01/2015  . Abnormal EKG 03/08/2015  . Chest pressure 03/08/2015  . Swelling of left lower extremity 10/11/2014  . Neoplasm of uncertain behavior of skin 09/19/2014  . Abdominal pain 03/14/2014  . Diarrhea, unspecified 03/14/2014  . GERD (gastroesophageal reflux disease) 03/14/2014  . Morbid obesity ([place]) 03/14/2014  . Tibial tendonitis, posterior  08/24/2013  . History of blood clots 07/01/2013  . Paresthesias 12/29/2012  . Meniere's disease 10/18/2012  . Migraine 10/18/2012    Past Surgical History:  Procedure Laterality Date  . CHOLECYSTECTOMY    . VEIN SURGERY Bilateral    vein surgery both legs    Prior to Admission medications   Medication Sig Start Date End Date Taking? Authorizing Provider  acetaminophen (TYLENOL) 325 MG tablet Take by mouth.    [provider]  aspirin-acetaminophen-caffeine (EXCEDRIN MIGRAINE) [phone] MG tablet Take by mouth.    [provider]  baclofen (LIORESAL) 10 MG tablet TAKE 1 TABLET BY MOUTH 2 TIMES DAILY AS NEEDED 07/01/18   [provider]  doxycycline (VIBRAMYCIN) 100 MG capsule Take 1 capsule (100 mg total) by mouth 2 (two) times daily for 14 days. 01/27/19 02/10/19  Gregor Hams, MD  fluticasone (FLONASE) 50 MCG/ACT nasal spray Place 2 sprays into both nostrils daily. 01/30/17   [provider]  gabapentin (NEURONTIN) 300 MG capsule  10/04/18   [provider]  ibuprofen (ADVIL,MOTRIN) 600 MG tablet TAKE 1 TABLET BY MOUTH THREE TIMES DAILY WITH A MEAL FOR 7 DAYS 04/09/18   [provider]  magnesium oxide (MAG-OX) 400 MG tablet Take by mouth.    [provider]  medroxyPROGESTERone (DEPO-PROVERA) 150 MG/ML injection Inject 150 mg into the muscle every 3 (three) months. Last injection June 2015    [provider]  meloxicam (MOBIC) 15 MG tablet TAKE 1 TABLET BY MOUTH EVERY DAY 09/08/18   Hyatt, Max T, DPM  omeprazole (PRILOSEC) 40 MG capsule Take 40 mg by mouth  daily.    [provider]  oxyCODONE-acetaminophen (PERCOCET) 5-325 MG tablet Take 8 tablets by mouth every 6 (six) hours as needed. 01/27/19 01/27/20  Gregor Hams, MD  potassium chloride SA (KLOR-CON M20) 20 MEQ tablet Take by mouth. 03/09/16   [provider]  pregabalin (LYRICA) 75 MG capsule Take by mouth. 12/31/18   [provider]    sertraline (ZOLOFT) 100 MG tablet Take 1 tablet (100 mg total) by mouth daily. 01/11/19   Ursula Alert, MD  SUMAtriptan (IMITREX) 20 MG/ACT nasal spray 1 SPRAY INTO LEFT NOSTRIL ONCE AS NEEDED FOR MIGRAINE. MAY REPEAT AFTER 2 HOURS IF NEEDED. 02/27/18   [provider]  traZODone (DESYREL) 150 MG tablet TAKE 1 TABLET (150 MG TOTAL) BY MOUTH AT BEDTIME. 12/17/18   Ursula Alert, MD  triamterene-hydrochlorothiazide (MAXZIDE-25) 37.5-25 MG tablet Take 0.5 tablets by mouth daily. 04/24/18   [provider]    Allergies Influenza vaccines; Sulfa antibiotics; and Topiramate  Family History  Problem Relation Age of Onset  . Panic disorder Mother   . Dementia Paternal Grandfather     Social History Social History   Tobacco Use  . Smoking status: Never Smoker  . Smokeless tobacco: Never Used  Substance Use Topics  . Alcohol use: No    Alcohol/week: 0.0 standard drinks  . Drug use: No    Review of Systems Constitutional: No fever/chills Eyes: No visual changes. ENT: No sore throat. Cardiovascular: Denies chest pain. Respiratory: Denies shortness of breath. Gastrointestinal: Positive for right pelvic pain.  No nausea, no vomiting.  No diarrhea.  No constipation. Genitourinary: Negative for dysuria. Musculoskeletal: Negative for neck pain.  Negative for back pain. Integumentary: Negative for rash. Neurological: Negative for headaches, focal weakness or numbness.   ____________________________________________   PHYSICAL EXAM:  VITAL SIGNS: ED Triage Vitals  Enc Vitals Group     BP 01/26/19 2134 114/72     Pulse Rate 01/26/19 2134 75     Resp 01/26/19 2134 (!) 22     Temp 01/26/19 2134 98.9 F (37.2 C)     Temp Source 01/26/19 2134 Oral     SpO2 01/26/19 2134 98 %     Weight 01/26/19 2134 132.9 kg (293 lb)     Height 01/26/19 2134 1.676 m (5\' 6" )     Head Circumference --      Peak Flow --      Pain Score 01/26/19 2151 8     Pain Loc --      Pain  Edu? --      Excl. in [place]? --     Constitutional: Alert and oriented.  Apparent discomfort  eyes: Conjunctivae are normal.  Head: Atraumatic. Mouth/Throat: Mucous membranes are moist.  Oropharynx non-erythematous. Neck: No stridor.   Cardiovascular: Normal rate, regular rhythm. Good peripheral circulation. Grossly normal heart sounds. Respiratory: Normal respiratory effort.  No retractions. Lungs CTAB. Gastrointestinal: Generalized tenderness to palpation.. No distention.  Genitourinary: No gross abnormality noted of the external genitalia.  Cervix erythematous with yellowish-white discharge.  Cervical motion tenderness noted Musculoskeletal: No lower extremity tenderness nor edema. No gross deformities of extremities. Neurologic:  Normal speech and language. No gross focal neurologic deficits are appreciated.  Skin:  Skin is warm, dry and intact. No rash noted. Psychiatric: Mood and affect are normal. Speech and behavior are normal.  ____________________________________________   LABS (all labs ordered are listed, but only abnormal results are displayed)  Labs Reviewed  WET PREP, GENITAL - Abnormal; Notable  for the following components:      Result Value   WBC, Wet Prep HPF POC FEW (*)    All other components within normal limits  COMPREHENSIVE METABOLIC PANEL - Abnormal; Notable for the following components:   Potassium 3.3 (*)    Glucose, Bld 100 (*)    All other components within normal limits  URINALYSIS, COMPLETE (UACMP) WITH MICROSCOPIC - Abnormal; Notable for the following components:   Color, Urine STRAW (*)    APPearance CLEAR (*)    Specific Gravity, Urine 1.004 (*)    Bacteria, UA MANY (*)    All other components within normal limits  CHLAMYDIA/NGC RT PCR ([facility] ONLY)  LIPASE, BLOOD  CBC  POC URINE PREG, ED  POCT PREGNANCY, URINE   __________________________  RADIOLOGY I, [place] BROWN, personally viewed and evaluated these images (plain  radiographs) as part of my medical decision making, as well as reviewing the written report by the radiologist.  ED MD interpretation: No acute process demonstrated abdomen and pelvis CT scan per radiologist.  Official radiology report(s): Ct Abdomen Pelvis W Contrast  Result Date: 01/27/2019 CLINICAL DATA:  Recurring right lower abdominal pain for the past couple of weeks. Patient was seen Tuesday by OBGYN and a pelvic ultrasound performed was reportedly negative. EXAM: CT ABDOMEN AND PELVIS WITH CONTRAST TECHNIQUE: Multidetector CT imaging of the abdomen and pelvis was performed using the standard protocol following bolus administration of intravenous contrast. CONTRAST:  146mL ISOVUE-300 IOPAMIDOL (ISOVUE-300) INJECTION 61% COMPARISON:  None. FINDINGS: Lower chest: Lung bases are clear. Hepatobiliary: No focal liver abnormality is seen. Status post cholecystectomy. No biliary dilatation. Pancreas: Unremarkable. No pancreatic ductal dilatation or surrounding inflammatory changes. Spleen: Normal in size without focal abnormality. Adrenals/Urinary Tract: Adrenal glands are unremarkable. Kidneys are normal, without renal calculi, focal lesion, or hydronephrosis. Bladder is unremarkable. Stomach/Bowel: Stomach is within normal limits. Appendix appears normal. No evidence of bowel wall thickening, distention, or inflammatory changes. Vascular/Lymphatic: No significant vascular findings are present. No enlarged abdominal or pelvic lymph nodes. Reproductive: Uterus and bilateral adnexa are unremarkable. Other: No abdominal wall hernia or abnormality. No abdominopelvic ascites. Musculoskeletal: No acute or significant osseous findings. IMPRESSION: No acute process demonstrated in the abdomen or pelvis. No evidence of bowel obstruction or inflammation. Appendix is normal. Electronically Signed   By: Lucienne Capers M.D.   On: 01/27/2019 00:46       Procedures   ____________________________________________   INITIAL IMPRESSION / ASSESSMENT AND PLAN / ED COURSE  As part of my medical decision making, I reviewed the following data within the electronic MEDICAL RECORD NUMBER  33 year old female presented with above-stated history and physical exam concerning for possible ovarian cysts appendicitis PID diverticulitis or other potential intra-abdominal pathology.  Patient states that when she had her exam performed and I described to her what the bimanual exam was she states that it was exquisitely painful.  CT abdomen pelvis revealed no acute abnormality in the abdomen pelvis.  Genitourinary exam revealed cervical motion tenderness and as such I suspect PID as the etiology for the patient's discomfort.  Patient given ceftriaxone 2 and 50 mg IM azithromycin 1 g in the emergency department will be prescribed doxycycline for home.    ____________________________________________  FINAL CLINICAL IMPRESSION(S) / ED DIAGNOSES  Final diagnoses:  PID (acute pelvic inflammatory disease)     MEDICATIONS GIVEN DURING THIS VISIT:  Medications  sodium chloride flush (NS) 0.9 % injection 3 mL (has no administration in time range)  cefTRIAXone (  ROCEPHIN) injection 250 mg (has no administration in time range)  doxycycline (VIBRA-TABS) tablet 100 mg (has no administration in time range)  azithromycin (ZITHROMAX) tablet 1,000 mg (has no administration in time range)  morphine 4 MG/ML injection 4 mg (4 mg Intravenous Given 01/27/19 0019)  ondansetron (ZOFRAN) injection 4 mg (4 mg Intravenous Given 01/27/19 0019)  iopamidol (ISOVUE-300) 61 % injection 125 mL (125 mLs Intravenous Contrast Given 01/27/19 0030)     ED Discharge Orders         Ordered    doxycycline (VIBRAMYCIN) 100 MG capsule  2 times daily     01/27/19 0143    oxyCODONE-acetaminophen (PERCOCET) 5-325 MG tablet  Every 6 hours PRN     01/27/19 0143           Note:  This  document was prepared using Dragon voice recognition software and may include unintentional dictation errors.   Gregor Hams, MD 01/27/19 [phone]

## 2019-01-27 NOTE — ED Notes (Signed)
Patient transported to CT 

## 2019-03-07 IMAGING — CT CT ABD-PELV W/ CM
2 of 4 series · 17 of 46 positions shown, 19 images · IV contrast (iopamidol)
Comparison: None.

CLINICAL DATA: Recurring right lower abdominal pain for the past
couple of weeks. Patient was seen [REDACTED] by OBGYN and a pelvic
ultrasound performed was reportedly negative.

EXAM:
CT ABDOMEN AND PELVIS WITH CONTRAST
TECHNIQUE: Multidetector CT imaging of the abdomen and pelvis was performed
using the standard protocol following bolus administration of
intravenous contrast.
CONTRAST:  125mL JUBURZ-BHH IOPAMIDOL (JUBURZ-BHH) INJECTION 61%

[Series 2: axial st · axial · 0.98mm/px · z∈[-1193,-738]mm · 14 of 99 slices shown, 16 images]
[im 4/99  soft-tissue]
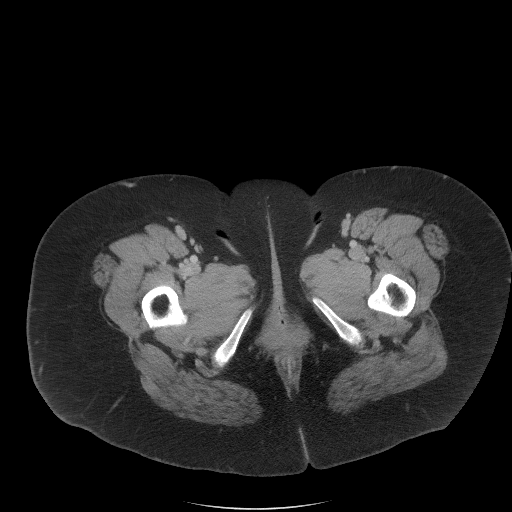
[im 4/99  bone]
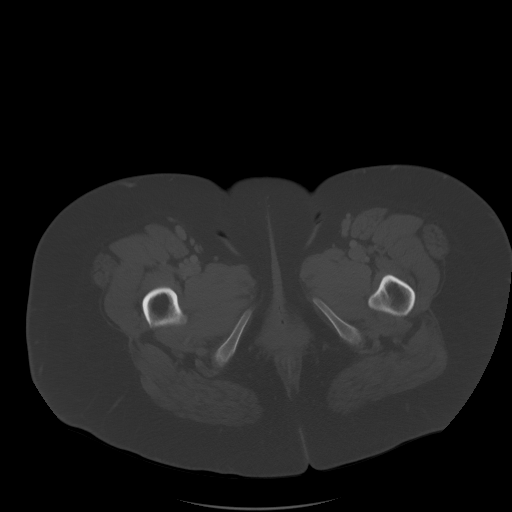
[im 12/99  soft-tissue]
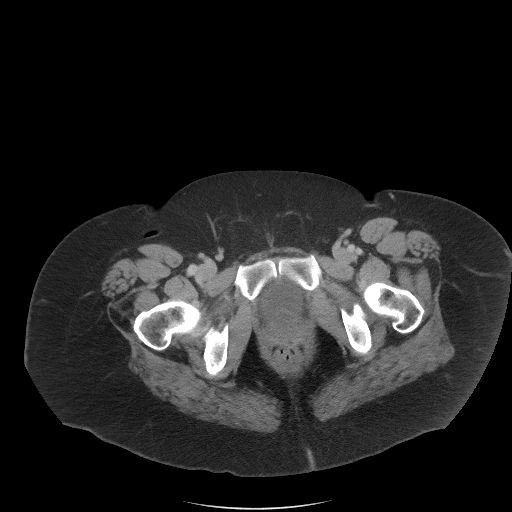
[im 20/99  soft-tissue]
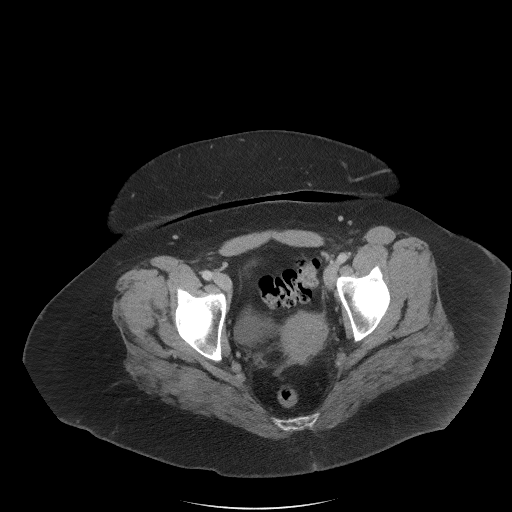
[im 28/99  soft-tissue]
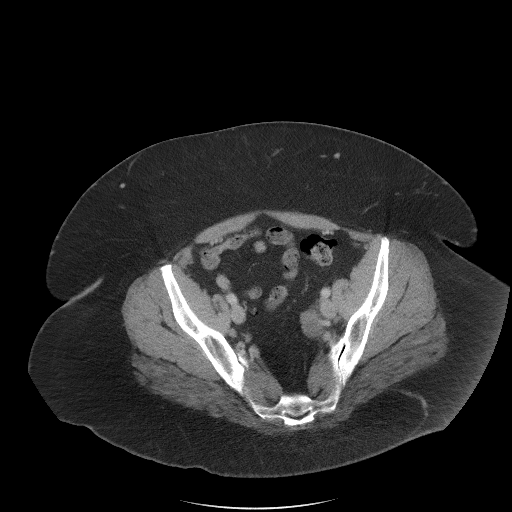
[im 32/99  soft-tissue]
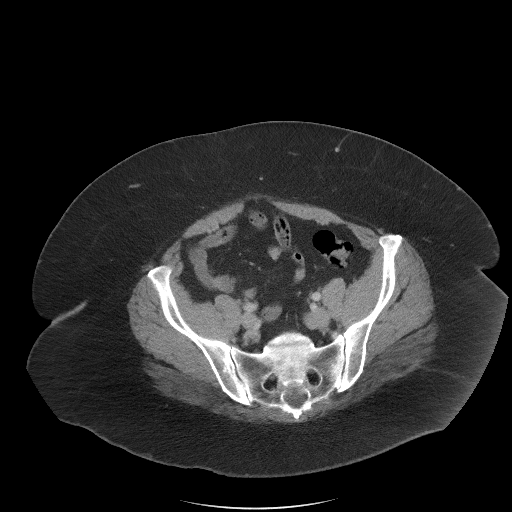
[im 40/99  soft-tissue]
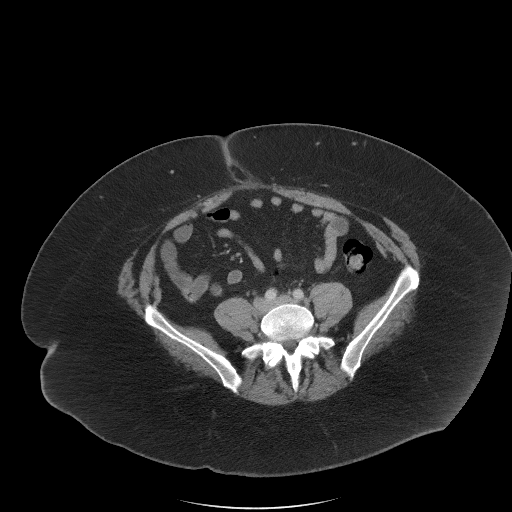
[im 48/99  soft-tissue]
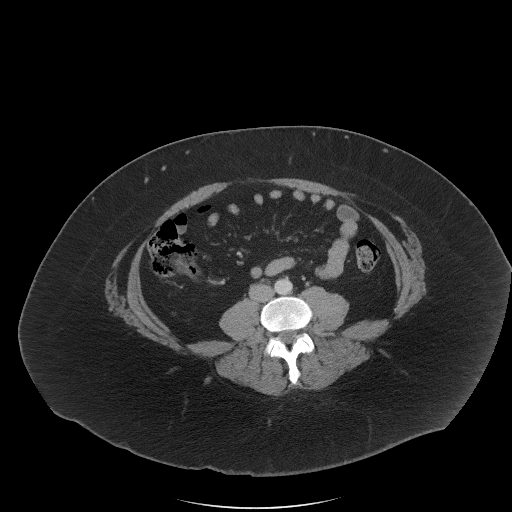
[im 51/99  soft-tissue]
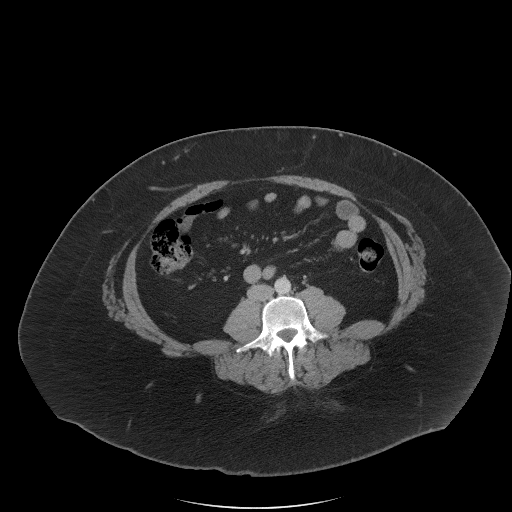
[im 59/99  soft-tissue]
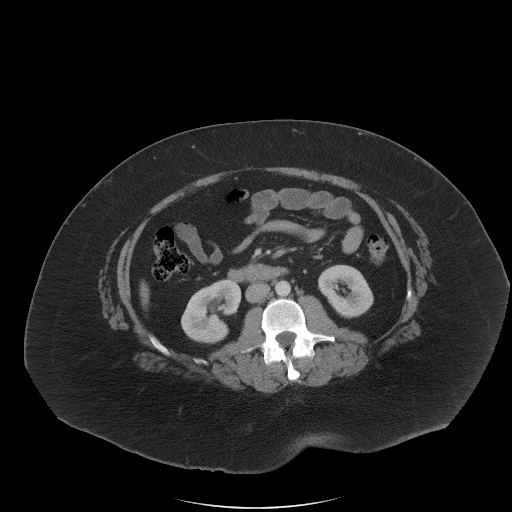
[im 59/99  bone]
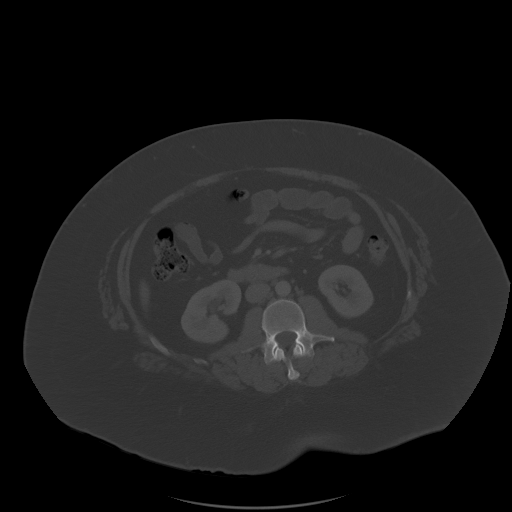
[im 67/99  soft-tissue]
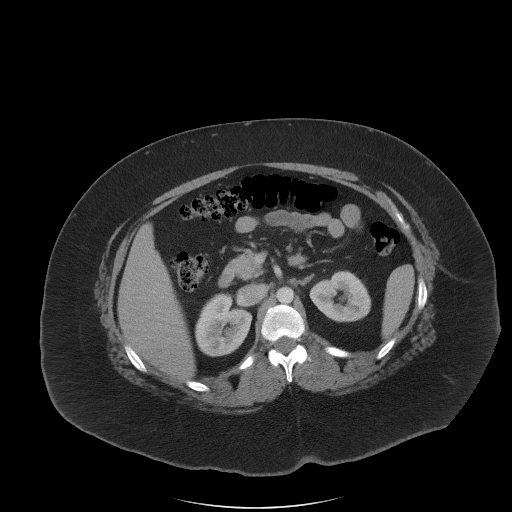
[im 75/99  soft-tissue]
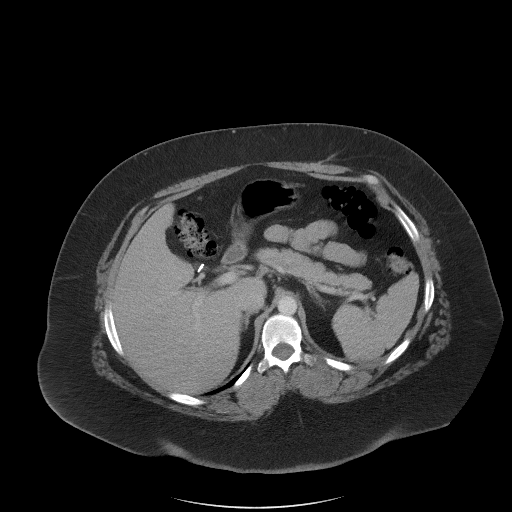
[im 79/99  soft-tissue]
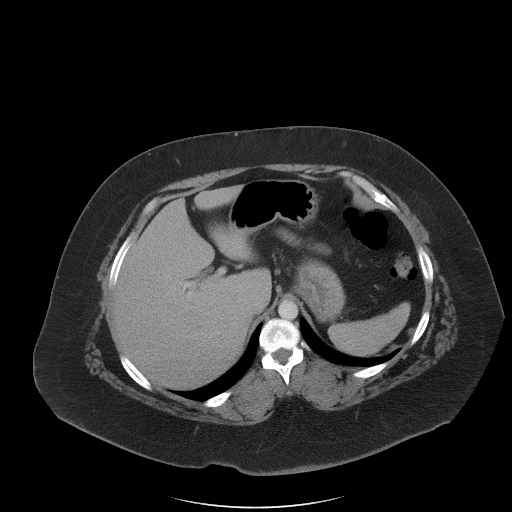
[im 87/99  soft-tissue]
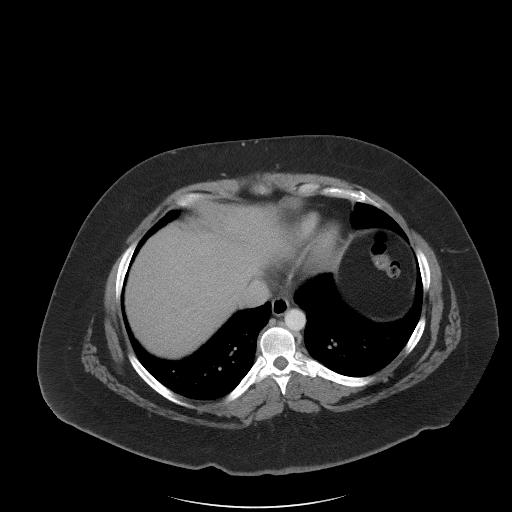
[im 95/99  soft-tissue]
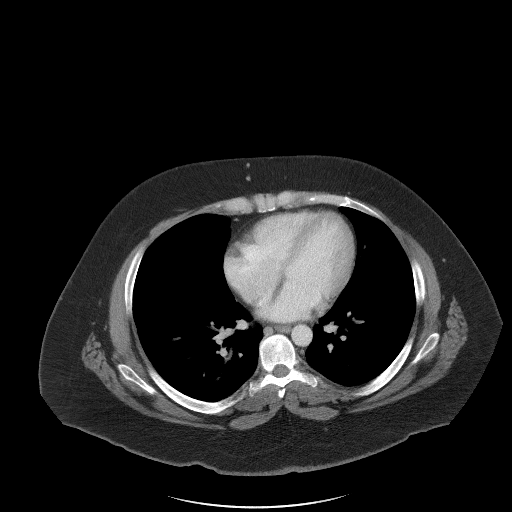

[Series 5: coronal st · coronal · 0.96mm/px · 3 of 108 slices shown]
[im 36/108  soft-tissue]
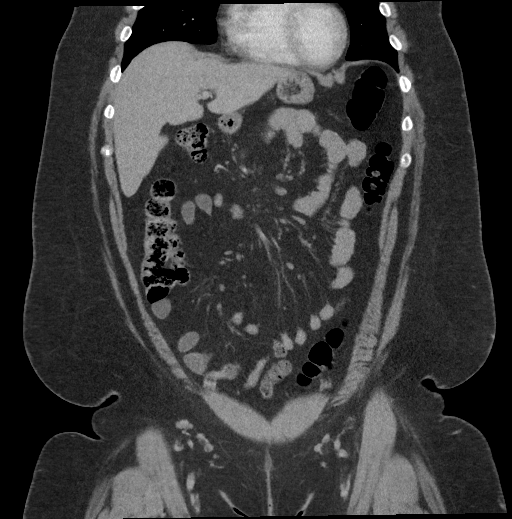
[im 48/108  soft-tissue]
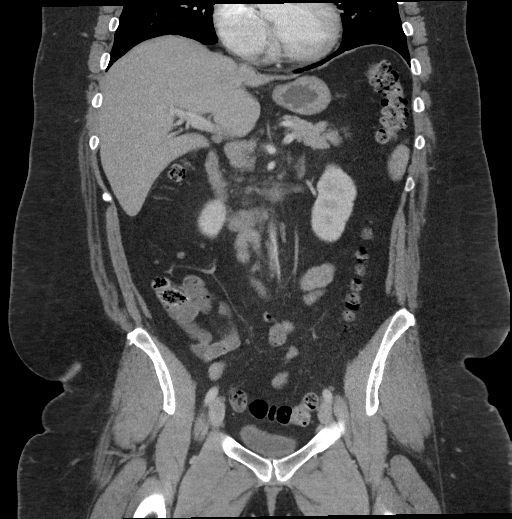
[im 60/108  soft-tissue]
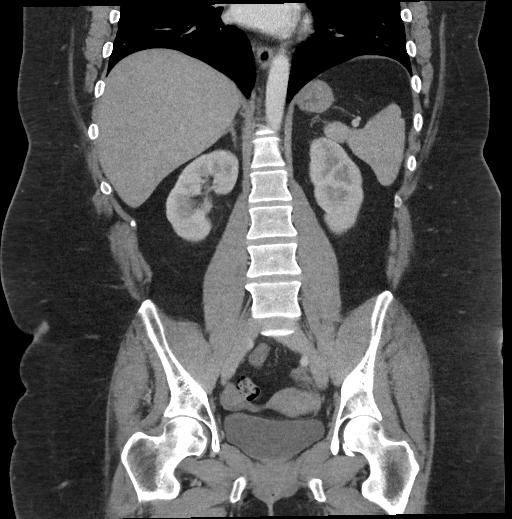

[17 of 46 positions shown; findings below may reference images not displayed]

FINDINGS: Lower chest: Lung bases are clear.

Hepatobiliary: No focal liver abnormality is seen. Status post
cholecystectomy. No biliary dilatation.

Pancreas: Unremarkable. No pancreatic ductal dilatation or
surrounding inflammatory changes.

Spleen: Normal in size without focal abnormality.

Adrenals/Urinary Tract: Adrenal glands are unremarkable. Kidneys are
normal, without renal calculi, focal lesion, or hydronephrosis.
Bladder is unremarkable.

Stomach/Bowel: Stomach is within normal limits. Appendix appears
normal. No evidence of bowel wall thickening, distention, or
inflammatory changes.

Vascular/Lymphatic: No significant vascular findings are present. No
enlarged abdominal or pelvic lymph nodes.

Reproductive: Uterus and bilateral adnexa are unremarkable.

Other: No abdominal wall hernia or abnormality. No abdominopelvic
ascites.

Musculoskeletal: No acute or significant osseous findings.
IMPRESSION: No acute process demonstrated in the abdomen or pelvis. No evidence
of bowel obstruction or inflammation. Appendix is normal.

## 2019-03-21 ENCOUNTER — Telehealth: Payer: Self-pay

## 2019-03-21 DIAGNOSIS — G4700 Insomnia, unspecified: Secondary | ICD-10-CM

## 2019-03-21 MED ORDER — TRAZODONE HCL 150 MG PO TABS: 150.0000 mg | ORAL_TABLET | Freq: Every day | ORAL | 0 refills | Status: DC

## 2019-03-21 NOTE — Telephone Encounter (Signed)
pt left message that she needed a refill on her trazodone     traZODone (DESYREL) 150 MG tablet  Medication  Date: 12/17/2018 Department: [place] Ordering/Authorizing: Ursula Alert, MD  Order Providers   Prescribing Provider Encounter Provider  Ursula Alert, MD Ursula Alert, MD  Outpatient Medication Detail    Disp Refills Start End   traZODone (DESYREL) 150 MG tablet 90 tablet 0 12/17/2018    Sig - Route: TAKE 1 TABLET (150 MG TOTAL) BY MOUTH AT BEDTIME. - Oral   Sent to pharmacy as: traZODone (DESYREL) 150 MG tablet   E-Prescribing Status: Receipt confirmed by pharmacy (12/17/2018 11:54 AM EST)

## 2019-03-21 NOTE — Telephone Encounter (Signed)
Sent Trazodone to pharmacy. 

## 2019-06-13 ENCOUNTER — Telehealth: Payer: Self-pay

## 2019-06-13 ENCOUNTER — Other Ambulatory Visit: Payer: Self-pay | Admitting: Psychiatry

## 2019-06-13 DIAGNOSIS — G4700 Insomnia, unspecified: Secondary | ICD-10-CM

## 2019-06-13 DIAGNOSIS — F411 Generalized anxiety disorder: Secondary | ICD-10-CM

## 2019-06-13 NOTE — Telephone Encounter (Signed)
  pt needs refills on the trazodone. will run out this week.    traZODone (DESYREL) 150 MG tablet Medication Date: 03/21/2019 Department: [place] Ordering/Authorizing: Ursula Alert, MD  Order Providers  Prescribing Provider Encounter Provider  Ursula Alert, MD Carlynn Purl, CMA  Outpatient Medication Detail   Disp Refills Start End   traZODone (DESYREL) 150 MG tablet 90 tablet 0 03/21/2019    Sig - Route: Take 1 tablet (150 mg total) by mouth at bedtime. - Oral   Sent to pharmacy as: traZODone (DESYREL) 150 MG tablet   E-Prescribing Status: Receipt confirmed by pharmacy (03/21/2019 4:42 PM EDT)

## 2019-06-16 MED ORDER — SERTRALINE HCL 100 MG PO TABS: 100.0000 mg | ORAL_TABLET | Freq: Every day | ORAL | 1 refills | Status: DC

## 2019-06-16 MED ORDER — TRAZODONE HCL 150 MG PO TABS: 150.0000 mg | ORAL_TABLET | Freq: Every day | ORAL | 0 refills | Status: DC

## 2019-06-16 NOTE — Telephone Encounter (Signed)
Sent zoloft and trazodone to pharmacy

## 2019-06-28 ENCOUNTER — Other Ambulatory Visit: Payer: Self-pay

## 2019-06-28 ENCOUNTER — Encounter: Payer: Self-pay | Admitting: Psychiatry

## 2019-06-28 ENCOUNTER — Ambulatory Visit (INDEPENDENT_AMBULATORY_CARE_PROVIDER_SITE_OTHER): Payer: 59 | Admitting: Psychiatry

## 2019-06-28 DIAGNOSIS — G4700 Insomnia, unspecified: Secondary | ICD-10-CM

## 2019-06-28 DIAGNOSIS — Z634 Disappearance and death of family member: Secondary | ICD-10-CM | POA: Insufficient documentation

## 2019-06-28 DIAGNOSIS — F411 Generalized anxiety disorder: Secondary | ICD-10-CM

## 2019-06-28 DIAGNOSIS — F3342 Major depressive disorder, recurrent, in full remission: Secondary | ICD-10-CM | POA: Diagnosis not present

## 2019-06-28 DIAGNOSIS — N9489 Other specified conditions associated with female genital organs and menstrual cycle: Secondary | ICD-10-CM | POA: Insufficient documentation

## 2019-06-28 MED ORDER — ZOLPIDEM TARTRATE 5 MG PO TABS: 5.0000 mg | ORAL_TABLET | Freq: Every evening | ORAL | 0 refills | Status: DC | PRN

## 2019-06-28 NOTE — Progress Notes (Signed)
Virtual Visit via Video Note  I connected with Evelyn Flores on 06/28/19 at  9:45 AM EDT by a video enabled telemedicine application and verified that I am speaking with the correct person using two identifiers.   I discussed the limitations of evaluation and management by telemedicine and the availability of in person appointments. The patient expressed understanding and agreed to proceed.   I discussed the assessment and treatment plan with the patient. The patient was provided an opportunity to ask questions and all were answered. The patient agreed with the plan and demonstrated an understanding of the instructions.   The patient was advised to call back or seek an in-person evaluation if the symptoms worsen or if the condition fails to improve as anticipated.   [facility] MD OP Progress Note  06/28/2019 12:03 PM Evelyn Flores  MRN:  [id number]  Chief Complaint:  Chief Complaint    Follow-up     HPI: Evelyn Flores is a 33 year old Caucasian female, single, lives at [place] with parents, employed, has a history of MDD, GAD, was evaluated by telemedicine today.  Patient today reports there has been some shift change at work.  That may have affected her sleep.  She reports she works currently from 8 AM to 4 PM.  She reports when she goes to bed around 8:30 AM she is able to fall asleep and sleep until till 6:30 AM in the morning.  She however feels like she has not slept at all when she wakes up.  She reports she is not sure if she snores or not.  She reports that trazodone helps her to fall asleep however it does not help her to maintain sleep she currently takes 150 mg of trazodone every day.  Discussed with patient to complete an Epworth Sleepiness Scale.  Patient scored 10 on the same.  She reports at some point in the past someone had told her that she may have mild sleep apnea.  She however does not use a CPAP and does not remember getting a sleep study done in the past.  She is agreeable to being  referred for sleep study today.  Patient otherwise reports mood symptoms as okay.  She continues to take Zoloft.  She denies any suicidality, homicidality or perceptual disturbances.  Patient denies any other concerns today. Visit Diagnosis:    ICD-10-CM   1. MDD (major depressive disorder), recurrent, in full remission ([place])  F33.42   2. Insomnia, unspecified type  G47.00 zolpidem (AMBIEN) 5 MG tablet  3. GAD (generalized anxiety disorder)  F41.1   4. Bereavement  Z63.4     Past Psychiatric History: I have reviewed past psychiatric history from my progress note on 03/18/2018.  Past Medical History:  Past Medical History:  Diagnosis Date  . Anxiety   . Depression   . DVT (deep venous thrombosis) ([place])   . Ear infection     Past Surgical History:  Procedure Laterality Date  . CHOLECYSTECTOMY    . VEIN SURGERY Bilateral    vein surgery both legs    Family Psychiatric History: I have reviewed family psychiatric history from my progress note on 03/18/2018.  Family History:  Family History  Problem Relation Age of Onset  . Panic disorder Mother   . Dementia Paternal Grandfather     Social History: I have reviewed social history from my progress note on 03/18/2018. Social History   Socioeconomic History  . Marital status: Single    Spouse name: Not on  file  . Number of children: 0  . Years of education: Not on file  . Highest education level: High school graduate  Occupational History  . Occupation: sport endeavors    Comment: fulltime  Social Needs  . Financial resource strain: Not hard at all  . Food insecurity    Worry: Never true    Inability: Never true  . Transportation needs    Medical: No    Non-medical: No  Tobacco Use  . Smoking status: Never Smoker  . Smokeless tobacco: Never Used  Substance and Sexual Activity  . Alcohol use: No    Alcohol/week: 0.0 standard drinks  . Drug use: No  . Sexual activity: Yes    Birth control/protection: Injection,  Condom  Lifestyle  . Physical activity    Days per week: 3 days    Minutes per session: 30 min  . Stress: Very much  Relationships  . Social Herbalist on phone: Once a week    Gets together: Once a week    Attends religious service: Never    Active member of club or organization: No    Attends meetings of clubs or organizations: Never    Relationship status: Never married  Other Topics Concern  . Not on file  Social History Narrative  . Not on file    Allergies:  Allergies  Allergen Reactions  . Influenza Vaccines Other (See Comments)    Very sick and was hospitalized   . Sulfa Antibiotics Swelling  . Topiramate Other (See Comments)    Mental fogginess, tingling, numbness    Metabolic Disorder Labs: No results found for: HGBA1C, MPG No results found for: PROLACTIN No results found for: CHOL, TRIG, HDL, CHOLHDL, VLDL, LDLCALC No results found for: TSH  Therapeutic Level Labs: No results found for: LITHIUM No results found for: VALPROATE No components found for:  CBMZ  Current Medications: Current Outpatient Medications  Medication Sig Dispense Refill  . acetaminophen (TYLENOL) 325 MG tablet Take by mouth.    Marland Kitchen aspirin-acetaminophen-caffeine (EXCEDRIN MIGRAINE) 250-250-65 MG tablet Take by mouth.    . baclofen (LIORESAL) 10 MG tablet TAKE 1 TABLET BY MOUTH 2 TIMES DAILY AS NEEDED  3  . fluticasone (FLONASE) 50 MCG/ACT nasal spray Place 2 sprays into both nostrils daily.  12  . gabapentin (NEURONTIN) 300 MG capsule   1  . ibuprofen (ADVIL,MOTRIN) 600 MG tablet TAKE 1 TABLET BY MOUTH THREE TIMES DAILY WITH A MEAL FOR 7 DAYS  0  . magnesium oxide (MAG-OX) 400 MG tablet Take by mouth.    . medroxyPROGESTERone (DEPO-PROVERA) 150 MG/ML injection Inject 150 mg into the muscle every 3 (three) months. Last injection June 2015    . meloxicam (MOBIC) 15 MG tablet TAKE 1 TABLET BY MOUTH EVERY DAY 30 tablet 3  . omeprazole (PRILOSEC) 40 MG capsule Take 40 mg by mouth  daily.    Marland Kitchen oxyCODONE-acetaminophen (PERCOCET) 5-325 MG tablet Take 8 tablets by mouth every 6 (six) hours as needed. 20 tablet 0  . potassium chloride SA (KLOR-CON M20) 20 MEQ tablet Take by mouth.    . pregabalin (LYRICA) 75 MG capsule Take by mouth.    . sertraline (ZOLOFT) 100 MG tablet Take 1 tablet (100 mg total) by mouth daily. 90 tablet 1  . SUMAtriptan (IMITREX) 20 MG/ACT nasal spray 1 SPRAY INTO LEFT NOSTRIL ONCE AS NEEDED FOR MIGRAINE. MAY REPEAT AFTER 2 HOURS IF NEEDED.  6  . triamterene-hydrochlorothiazide (MAXZIDE-25) 37.5-25 MG tablet  Take 0.5 tablets by mouth daily.  6  . zolpidem (AMBIEN) 5 MG tablet Take 1 tablet (5 mg total) by mouth at bedtime as needed for sleep. 30 tablet 0   No current facility-administered medications for this visit.      Musculoskeletal: Strength & Muscle Tone: within normal limits Gait & Station: normal Patient leans: N/A  Psychiatric Specialty Exam: Review of Systems  Psychiatric/Behavioral: The patient has insomnia.   All other systems reviewed and are negative.   There were no vitals taken for this visit.There is no height or weight on file to calculate BMI.  General Appearance: Casual  Eye Contact:  Fair  Speech:  Normal Rate  Volume:  Normal  Mood:  Euthymic  Affect:  Appropriate  Thought Process:  Goal Directed and Descriptions of Associations: Intact  Orientation:  Full (Time, Place, and Person)  Thought Content: Logical   Suicidal Thoughts:  No  Homicidal Thoughts:  No  Memory:  Immediate;   Fair Recent;   Fair Remote;   Fair  Judgement:  Fair  Insight:  Fair  Psychomotor Activity:  Normal  Concentration:  Concentration: Fair and Attention Span: Fair  Recall:  [employer] of Knowledge: Fair  Language: Fair  Akathisia:  No  Handed:  Right  AIMS (if indicated): denies tremor,rigidity  Assets:  Communication Skills Desire for Improvement Housing Social Support  ADL's:  Intact  Cognition: WNL  Sleep:  Poor    Screenings: PHQ2-9     Nutrition from 05/07/2017 in [facility]  PHQ-2 Total Score  0       Assessment and Plan: Maryjo is a 33 year old Caucasian female, single, employed, lives in [place], has a history of MDD, GAD was evaluated by telemedicine today.  Patient with psychosocial stressors of her own health issues and current COVID-19 crisis.  Patient currently struggles with sleep.  Completed Epworth sleep scale and will refer patient for sleep study.  Will make the following medication changes.  Plan For bereavement- improving Will monitor closely.  For GAD- stable Zoloft 100 mg po daily.  For MDD in remission Zoloft as prescribed  For insomnia- unstable Discontinue trazodone. Start Ambien 5 mg p.o. nightly as needed Provided medication education and discussed with patient that Ambien is a habit-forming medication and has adverse side effects.  She will monitor herself closely. She completed Epworth sleep scale today- scored 10 on the same. We will refer her for sleep study.  Follow-up in clinic in 1 month or sooner if needed.  August 25 at 10:15 AM  I have spent atleast 15 minutes non face to face with patient today. More than 50 % of the time was spent for psychoeducation and supportive psychotherapy and care coordination.  This note was generated in part or whole with voice recognition software. Voice recognition is usually quite accurate but there are transcription errors that can and very often do occur. I apologize for any typographical errors that were not detected and corrected.         Ursula Alert, MD 06/28/2019, 12:03 PM

## 2019-07-12 ENCOUNTER — Ambulatory Visit: Payer: 59 | Admitting: Psychiatry

## 2019-08-16 ENCOUNTER — Encounter: Payer: Self-pay | Admitting: Psychiatry

## 2019-08-16 ENCOUNTER — Ambulatory Visit (INDEPENDENT_AMBULATORY_CARE_PROVIDER_SITE_OTHER): Payer: 59 | Admitting: Psychiatry

## 2019-08-16 ENCOUNTER — Other Ambulatory Visit: Payer: Self-pay

## 2019-08-16 DIAGNOSIS — F411 Generalized anxiety disorder: Secondary | ICD-10-CM

## 2019-08-16 DIAGNOSIS — G4700 Insomnia, unspecified: Secondary | ICD-10-CM

## 2019-08-16 DIAGNOSIS — F3342 Major depressive disorder, recurrent, in full remission: Secondary | ICD-10-CM

## 2019-08-16 MED ORDER — TRAZODONE HCL 150 MG PO TABS: 150.0000 mg | ORAL_TABLET | Freq: Every day | ORAL | 1 refills | Status: DC

## 2019-08-16 NOTE — Progress Notes (Signed)
Virtual Visit via Video Note  I connected with Evelyn Flores on 08/16/19 at 10:15 AM EDT by a video enabled telemedicine application and verified that I am speaking with the correct person using two identifiers.   I discussed the limitations of evaluation and management by telemedicine and the availability of in person appointments. The patient expressed understanding and agreed to proceed.   I discussed the assessment and treatment plan with the patient. The patient was provided an opportunity to ask questions and all were answered. The patient agreed with the plan and demonstrated an understanding of the instructions.   The patient was advised to call back or seek an in-person evaluation if the symptoms worsen or if the condition fails to improve as anticipated.   [facility] MD OP Progress Note  08/16/2019 1:47 PM MARIANNY MCFEELY  MRN:  [id number]  Chief Complaint:  Chief Complaint    Follow-up     HPI: Evelyn Flores is a 33 year old Caucasian female, single, lives at [place] with parents, employed, has a history of MDD, GAD was evaluated by telemedicine today.  Patient today reports she continues to be working and work continues to be busy.  She reports overall she has been doing okay with regards to her mood symptoms.  She reports sleep continues to be restless.  She stopped taking the Ambien since she felt extremely tired the next day.  She is currently back on trazodone.  She takes trazodone 150 mg at bedtime.  She reports she continues to feel fatigued and tired during the day.  She also struggles with some napping during the day.  She reports she was told she may have mild sleep apnea in the past however does not use a CPAP.  She was referred for sleep study from our clinic but was denied.  Patient today reports she wants to stay on the trazodone.  She reports she does not want to make any more medication changes.  She denies any suicidality, homicidality or perceptual disturbances.  She  denies any other concerns today. Visit Diagnosis:    ICD-10-CM   1. MDD (major depressive disorder), recurrent, in full remission ([place])  F33.42   2. Insomnia, unspecified type  G47.00 traZODone (DESYREL) 150 MG tablet  3. GAD (generalized anxiety disorder)  F41.1     Past Psychiatric History: I have reviewed past psychiatric history from my progress note on 03/18/2018.  Past Medical History:  Past Medical History:  Diagnosis Date  . Anxiety   . Depression   . DVT (deep venous thrombosis) ([place])   . Ear infection     Past Surgical History:  Procedure Laterality Date  . CHOLECYSTECTOMY    . VEIN SURGERY Bilateral    vein surgery both legs    Family Psychiatric History: I have reviewed family psychiatric history from my progress note on 03/18/2018.  Family History:  Family History  Problem Relation Age of Onset  . Panic disorder Mother   . Dementia Paternal Grandfather     Social History: I have reviewed social history from my progress note on 03/18/2018. Social History   Socioeconomic History  . Marital status: Single    Spouse name: Not on file  . Number of children: 0  . Years of education: Not on file  . Highest education level: High school graduate  Occupational History  . Occupation: sport endeavors    Comment: fulltime  Social Needs  . Financial resource strain: Not hard at all  . Food insecurity  Worry: Never true    Inability: Never true  . Transportation needs    Medical: No    Non-medical: No  Tobacco Use  . Smoking status: Never Smoker  . Smokeless tobacco: Never Used  Substance and Sexual Activity  . Alcohol use: No    Alcohol/week: 0.0 standard drinks  . Drug use: No  . Sexual activity: Yes    Birth control/protection: Injection, Condom  Lifestyle  . Physical activity    Days per week: 3 days    Minutes per session: 30 min  . Stress: Very much  Relationships  . Social Herbalist on phone: Once a week    Gets together: Once a  week    Attends religious service: Never    Active member of club or organization: No    Attends meetings of clubs or organizations: Never    Relationship status: Never married  Other Topics Concern  . Not on file  Social History Narrative  . Not on file    Allergies:  Allergies  Allergen Reactions  . Influenza Vaccines Other (See Comments)    Very sick and was hospitalized   . Sulfa Antibiotics Swelling  . Topiramate Other (See Comments)    Mental fogginess, tingling, numbness    Metabolic Disorder Labs: No results found for: HGBA1C, MPG No results found for: PROLACTIN No results found for: CHOL, TRIG, HDL, CHOLHDL, VLDL, LDLCALC No results found for: TSH  Therapeutic Level Labs: No results found for: LITHIUM No results found for: VALPROATE No components found for:  CBMZ  Current Medications: Current Outpatient Medications  Medication Sig Dispense Refill  . acetaminophen (TYLENOL) 325 MG tablet Take by mouth.    Marland Kitchen aspirin-acetaminophen-caffeine (EXCEDRIN MIGRAINE) 250-250-65 MG tablet Take by mouth.    . baclofen (LIORESAL) 10 MG tablet TAKE 1 TABLET BY MOUTH 2 TIMES DAILY AS NEEDED  3  . fluticasone (FLONASE) 50 MCG/ACT nasal spray Place 2 sprays into both nostrils daily.  12  . gabapentin (NEURONTIN) 300 MG capsule   1  . ibuprofen (ADVIL,MOTRIN) 600 MG tablet TAKE 1 TABLET BY MOUTH THREE TIMES DAILY WITH A MEAL FOR 7 DAYS  0  . magnesium oxide (MAG-OX) 400 MG tablet Take by mouth.    . medroxyPROGESTERone (DEPO-PROVERA) 150 MG/ML injection Inject 150 mg into the muscle every 3 (three) months. Last injection June 2015    . medroxyPROGESTERone Acetate 150 MG/ML SUSY INJECT 1 ML INTO THE MUSCLE EVERY 3 MONTHS    . meloxicam (MOBIC) 15 MG tablet TAKE 1 TABLET BY MOUTH EVERY DAY 30 tablet 3  . omeprazole (PRILOSEC) 40 MG capsule Take 40 mg by mouth daily.    Marland Kitchen oxyCODONE-acetaminophen (PERCOCET) 5-325 MG tablet Take 8 tablets by mouth every 6 (six) hours as needed. 20  tablet 0  . potassium chloride SA (KLOR-CON M20) 20 MEQ tablet Take by mouth.    . pregabalin (LYRICA) 75 MG capsule Take by mouth.    . sertraline (ZOLOFT) 100 MG tablet Take 1 tablet (100 mg total) by mouth daily. 90 tablet 1  . SUMAtriptan (IMITREX) 20 MG/ACT nasal spray 1 SPRAY INTO LEFT NOSTRIL ONCE AS NEEDED FOR MIGRAINE. MAY REPEAT AFTER 2 HOURS IF NEEDED.  6  . traZODone (DESYREL) 150 MG tablet Take 1 tablet (150 mg total) by mouth at bedtime. 90 tablet 1  . triamterene-hydrochlorothiazide (MAXZIDE-25) 37.5-25 MG tablet Take 0.5 tablets by mouth daily.  6   No current facility-administered medications for this visit.  Musculoskeletal: Strength & Muscle Tone: UTA Gait & Station: normal Patient leans: N/A  Psychiatric Specialty Exam: Review of Systems  Psychiatric/Behavioral: The patient has insomnia. The patient is not nervous/anxious.   All other systems reviewed and are negative.   There were no vitals taken for this visit.There is no height or weight on file to calculate BMI.  General Appearance: Casual  Eye Contact:  Fair  Speech:  Clear and Coherent  Volume:  Normal  Mood:  Euthymic  Affect:  Congruent  Thought Process:  Goal Directed and Descriptions of Associations: Intact  Orientation:  Full (Time, Place, and Person)  Thought Content: Logical   Suicidal Thoughts:  No  Homicidal Thoughts:  No  Memory:  Immediate;   Fair Recent;   Fair Remote;   Fair  Judgement:  Fair  Insight:  Fair  Psychomotor Activity:  Normal  Concentration:  Concentration: Fair and Attention Span: Fair  Recall:  [employer] of Knowledge: Fair  Language: Fair  Akathisia:  No  Handed:  Right  AIMS (if indicated): denies tremors, rigidity  Assets:  Communication Skills Desire for Improvement Housing Social Support  ADL's:  Intact  Cognition: WNL  Sleep:  Restless   Screenings: PHQ2-9     Nutrition from 05/07/2017 in [facility]  PHQ-2 Total Score  0        Assessment and Plan: Evelyn Flores is a 33 year old Caucasian female, single, employed, lives in [place], has a history of MDD, GAD was evaluated by telemedicine today.  Patient with psychosocial stressors of her own health issues, current COVID-19 crisis.  Patient continues to struggle with sleep however otherwise doing okay.  She declines any changes with her sleep medications today.  We will continue plan as noted below.  Plan Bereavement- improving Will monitor closely.  GAD-stable Zoloft 100 mg p.o. daily  MDD in remission Zoloft as prescribed  Insomnia-unstable Restart trazodone 150 mg p.o. nightly. Discontinue Ambien for side effects and noncompliance. Patient was referred for sleep study however was denied.  She will monitor her symptoms closely.  She will also talk to her PMD.  Follow-up in clinic in 1 month or sooner if needed.  October 14 at 10:30 AM  I have spent atleast 15 minutes non face to face with patient today. More than 50 % of the time was spent for psychoeducation and supportive psychotherapy and care coordination.  This note was generated in part or whole with voice recognition software. Voice recognition is usually quite accurate but there are transcription errors that can and very often do occur. I apologize for any typographical errors that were not detected and corrected.         Ursula Alert, MD 08/16/2019, 1:47 PM

## 2019-10-05 ENCOUNTER — Encounter: Payer: Self-pay | Admitting: Psychiatry

## 2019-10-05 ENCOUNTER — Other Ambulatory Visit: Payer: Self-pay

## 2019-10-05 ENCOUNTER — Ambulatory Visit (INDEPENDENT_AMBULATORY_CARE_PROVIDER_SITE_OTHER): Payer: 59 | Admitting: Psychiatry

## 2019-10-05 DIAGNOSIS — G4700 Insomnia, unspecified: Secondary | ICD-10-CM | POA: Diagnosis not present

## 2019-10-05 DIAGNOSIS — F3342 Major depressive disorder, recurrent, in full remission: Secondary | ICD-10-CM | POA: Diagnosis not present

## 2019-10-05 DIAGNOSIS — F411 Generalized anxiety disorder: Secondary | ICD-10-CM | POA: Diagnosis not present

## 2019-10-05 NOTE — Progress Notes (Signed)
Virtual Visit via Video Note  I connected with Evelyn Flores on 10/05/19 at 10:30 AM EDT by a video enabled telemedicine application and verified that I am speaking with the correct person using two identifiers.   I discussed the limitations of evaluation and management by telemedicine and the availability of in person appointments. The patient expressed understanding and agreed to proceed.   I discussed the assessment and treatment plan with the patient. The patient was provided an opportunity to ask questions and all were answered. The patient agreed with the plan and demonstrated an understanding of the instructions.   The patient was advised to call back or seek an in-person evaluation if the symptoms worsen or if the condition fails to improve as anticipated.   [facility] MD OP Progress Note  10/05/2019 5:33 PM DAZIE SUHRE  MRN:  [id number]  Chief Complaint:  Chief Complaint    Follow-up     HPI: Evelyn Flores is a 33 year old Caucasian female, single, lives at [place] with parents, employed, has a history of MDD, GAD, was evaluated by telemedicine today.  Patient today reports she is currently making progress on the Zoloft.  She denies any significant sadness.  She denies any anxiety symptoms.  She however continues to have episodes of feeling fatigued during the day.  She is also currently struggling with a UTI and is on antibiotic.  She reports it is getting better though.  She reports she tried melatonin 5 mg over-the-counter along with her trazodone.  Her sleep seems to be improving now.  Patient denies any suicidality, homicidality or perceptual disturbances.  Patient reports she was able to contact her health insurance plan however they will not approve her getting a sleep study.  She does not want to do it at this time.  Patient denies any other concerns today. Visit Diagnosis:    ICD-10-CM   1. MDD (major depressive disorder), recurrent, in full remission ([place])  F33.42   2.  Insomnia, unspecified type  G47.00   3. GAD (generalized anxiety disorder)  F41.1     Past Psychiatric History: I have reviewed past psychiatric history from my progress note on 03/18/2018.  Past Medical History:  Past Medical History:  Diagnosis Date  . Anxiety   . Depression   . DVT (deep venous thrombosis) ([place])   . Ear infection     Past Surgical History:  Procedure Laterality Date  . CHOLECYSTECTOMY    . VEIN SURGERY Bilateral    vein surgery both legs    Family Psychiatric History: I have reviewed family psychiatric history from my progress note on 03/18/2018.  Family History:  Family History  Problem Relation Age of Onset  . Panic disorder Mother   . Dementia Paternal Grandfather     Social History: Reviewed social history from my progress note on 03/18/2018. Social History   Socioeconomic History  . Marital status: Single    Spouse name: Not on file  . Number of children: 0  . Years of education: Not on file  . Highest education level: High school graduate  Occupational History  . Occupation: sport endeavors    Comment: fulltime  Social Needs  . Financial resource strain: Not hard at all  . Food insecurity    Worry: Never true    Inability: Never true  . Transportation needs    Medical: No    Non-medical: No  Tobacco Use  . Smoking status: Never Smoker  . Smokeless tobacco: Never Used  Substance  and Sexual Activity  . Alcohol use: No    Alcohol/week: 0.0 standard drinks  . Drug use: No  . Sexual activity: Yes    Birth control/protection: Injection, Condom  Lifestyle  . Physical activity    Days per week: 3 days    Minutes per session: 30 min  . Stress: Very much  Relationships  . Social Herbalist on phone: Once a week    Gets together: Once a week    Attends religious service: Never    Active member of club or organization: No    Attends meetings of clubs or organizations: Never    Relationship status: Never married  Other Topics  Concern  . Not on file  Social History Narrative  . Not on file    Allergies:  Allergies  Allergen Reactions  . Influenza Vaccines Other (See Comments)    Very sick and was hospitalized   . Sulfa Antibiotics Swelling  . Topiramate Other (See Comments)    Mental fogginess, tingling, numbness    Metabolic Disorder Labs: No results found for: HGBA1C, MPG No results found for: PROLACTIN No results found for: CHOL, TRIG, HDL, CHOLHDL, VLDL, LDLCALC No results found for: TSH  Therapeutic Level Labs: No results found for: LITHIUM No results found for: VALPROATE No components found for:  CBMZ  Current Medications: Current Outpatient Medications  Medication Sig Dispense Refill  . cephALEXin (KEFLEX) 500 MG capsule Take by mouth.    . pregabalin (LYRICA) 75 MG capsule Take 1 capsule by mouth twice daily    . acetaminophen (TYLENOL) 325 MG tablet Take by mouth.    Marland Kitchen aspirin-acetaminophen-caffeine (EXCEDRIN MIGRAINE) 250-250-65 MG tablet Take by mouth.    . baclofen (LIORESAL) 10 MG tablet TAKE 1 TABLET BY MOUTH 2 TIMES DAILY AS NEEDED  3  . fluticasone (FLONASE) 50 MCG/ACT nasal spray Place 2 sprays into both nostrils daily.  12  . gabapentin (NEURONTIN) 300 MG capsule   1  . ibuprofen (ADVIL,MOTRIN) 600 MG tablet TAKE 1 TABLET BY MOUTH THREE TIMES DAILY WITH A MEAL FOR 7 DAYS  0  . magnesium oxide (MAG-OX) 400 MG tablet Take by mouth.    . medroxyPROGESTERone (DEPO-PROVERA) 150 MG/ML injection Inject 150 mg into the muscle every 3 (three) months. Last injection June 2015    . medroxyPROGESTERone Acetate 150 MG/ML SUSY INJECT 1 ML INTO THE MUSCLE EVERY 3 MONTHS    . meloxicam (MOBIC) 15 MG tablet TAKE 1 TABLET BY MOUTH EVERY DAY 30 tablet 3  . omeprazole (PRILOSEC) 40 MG capsule Take 40 mg by mouth daily.    Marland Kitchen oxyCODONE-acetaminophen (PERCOCET) 5-325 MG tablet Take 8 tablets by mouth every 6 (six) hours as needed. 20 tablet 0  . potassium chloride SA (KLOR-CON M20) 20 MEQ tablet Take  by mouth.    . pregabalin (LYRICA) 75 MG capsule Take by mouth.    . rizatriptan (MAXALT-MLT) 10 MG disintegrating tablet     . sertraline (ZOLOFT) 100 MG tablet Take 1 tablet (100 mg total) by mouth daily. 90 tablet 1  . SUMAtriptan (IMITREX) 20 MG/ACT nasal spray 1 SPRAY INTO LEFT NOSTRIL ONCE AS NEEDED FOR MIGRAINE. MAY REPEAT AFTER 2 HOURS IF NEEDED.  6  . traZODone (DESYREL) 150 MG tablet Take 1 tablet (150 mg total) by mouth at bedtime. 90 tablet 1  . triamterene-hydrochlorothiazide (MAXZIDE-25) 37.5-25 MG tablet Take 0.5 tablets by mouth daily.  6   No current facility-administered medications for this visit.  Musculoskeletal: Strength & Muscle Tone: UTA Gait & Station: normal Patient leans: N/A  Psychiatric Specialty Exam: Review of Systems  Psychiatric/Behavioral: The patient has insomnia (improving).   All other systems reviewed and are negative.   There were no vitals taken for this visit.There is no height or weight on file to calculate BMI.  General Appearance: Casual  Eye Contact:  Fair  Speech:  Clear and Coherent  Volume:  Normal  Mood:  Euthymic  Affect:  Congruent  Thought Process:  Goal Directed and Descriptions of Associations: Intact  Orientation:  Full (Time, Place, and Person)  Thought Content: Logical   Suicidal Thoughts:  No  Homicidal Thoughts:  No  Memory:  Immediate;   Fair Recent;   Fair Remote;   Fair  Judgement:  Fair  Insight:  Fair  Psychomotor Activity:  Normal  Concentration:  Concentration: Fair and Attention Span: Fair  Recall:  [employer] of Knowledge: Fair  Language: Fair  Akathisia:  No  Handed:  Right  AIMS (if indicated): denies tremors, rigidity  Assets:  Communication Skills Desire for Improvement Housing Social Support  ADL's:  Intact  Cognition: WNL  Sleep:  Improved   Screenings: PHQ2-9     Nutrition from 05/07/2017 in [place]  PHQ-2 Total Score  0       Assessment and Plan: Scottlynn  is a 33 year old Caucasian female, single, employed, lives in [place], has a history of MDD, GAD was evaluated by telemedicine today.  Patient with psychosocial stressors of her own health issues, current COVID-19 pandemic, job related stressors.  Patient however is currently making progress.  Her sleep is also improving.  Plan as noted below.  Plan MDD in remission Continue Zoloft 100 mg p.o. daily  GAD-stable Zoloft 100 mg p.o. daily  Insomnia unspecified -patient reports a remote history of obstructive sleep apnea Patient was referred for a sleep study however insurance did not approve it. Patient does not want to get it at this time. Continue trazodone 150 mg p.o. nightly She also take melatonin 5 mg p.o. daily  Follow-up in clinic in 3 months or sooner if needed.  December 29 at 11 AM  I have spent atleast 15 minutes non face to face with patient today. More than 50 % of the time was spent for psychoeducation and supportive psychotherapy and care coordination. This note was generated in part or whole with voice recognition software. Voice recognition is usually quite accurate but there are transcription errors that can and very often do occur. I apologize for any typographical errors that were not detected and corrected.       Ursula Alert, MD 10/05/2019, 5:33 PM

## 2019-11-24 DIAGNOSIS — I10 Essential (primary) hypertension: Secondary | ICD-10-CM | POA: Insufficient documentation

## 2019-12-07 DIAGNOSIS — I890 Lymphedema, not elsewhere classified: Secondary | ICD-10-CM | POA: Insufficient documentation

## 2019-12-12 ENCOUNTER — Other Ambulatory Visit: Payer: Self-pay

## 2019-12-12 ENCOUNTER — Encounter (INDEPENDENT_AMBULATORY_CARE_PROVIDER_SITE_OTHER): Payer: Self-pay | Admitting: Vascular Surgery

## 2019-12-12 ENCOUNTER — Ambulatory Visit (INDEPENDENT_AMBULATORY_CARE_PROVIDER_SITE_OTHER): Payer: Managed Care, Other (non HMO) | Admitting: Vascular Surgery

## 2019-12-12 VITALS — BP 120/71 | HR 90 | Resp 16 | Wt 303.6 lb

## 2019-12-12 DIAGNOSIS — I890 Lymphedema, not elsewhere classified: Secondary | ICD-10-CM

## 2019-12-12 DIAGNOSIS — K219 Gastro-esophageal reflux disease without esophagitis: Secondary | ICD-10-CM

## 2019-12-12 DIAGNOSIS — I10 Essential (primary) hypertension: Secondary | ICD-10-CM

## 2019-12-12 DIAGNOSIS — I872 Venous insufficiency (chronic) (peripheral): Secondary | ICD-10-CM | POA: Diagnosis not present

## 2019-12-12 NOTE — Progress Notes (Signed)
MRN : [id number]  Evelyn Flores is a 33 y.o. (1986-10-12) female who presents with chief complaint of  Chief Complaint  Patient presents with  . New Patient (Initial Visit)    ref [facility] ven insuff,leg swelling,hx of dvt  .  History of Present Illness:  Patient is seen for evaluation of leg pain and leg swelling. The patient first noticed the swelling remotely mostly following aremote DVT.  She notes her right leg is much worse than the left.  The swelling is associated with pain and discoloration. The pain and swelling worsens with prolonged dependency and improves with elevation. The pain is unrelated to activity.  The patient notes that in the morning the legs are significantly improved but they steadily worsened throughout the course of the day. The patient also notes a steady worsening of the discoloration in the ankle and shin area.   She is s/p venous ablation by [facility] in 2018.  The patient denies claudication symptoms.  The patient denies symptoms consistent with rest pain.  The patient denies and extensive history of DJD and LS spine disease.  The patient has no had any past angiography, interventions or vascular surgery.  Elevation makes the leg symptoms better, dependency makes them much worse. There is no history of ulcerations. The patient denies any recent changes in medications.  The patient has been wearing graduated compression.  The patient has a history of right leg DVT; no history of PE.   There is also history of primary lymphedema.  She has been [facility] at [facility] lymp[facility] in the past.  No history of malignancies. No history of trauma or groin or pelvic surgery. There is no history of radiation treatment to the groin or pelvis  The patient denies amaurosis fugax or recent TIA symptoms. There are no recent neurological changes noted. The patient denies recent episodes of angina or shortness of breath  Current Meds  Medication Sig  . acetaminophen  (TYLENOL) 325 MG tablet Take by mouth.  Marland Kitchen aspirin-acetaminophen-caffeine (EXCEDRIN MIGRAINE) 250-250-65 MG tablet Take by mouth.  . baclofen (LIORESAL) 10 MG tablet TAKE 1 TABLET BY MOUTH 2 TIMES DAILY AS NEEDED  . fluticasone (FLONASE) 50 MCG/ACT nasal spray Place 2 sprays into both nostrils daily.  Marland Kitchen gabapentin (NEURONTIN) 300 MG capsule   . ibuprofen (ADVIL,MOTRIN) 600 MG tablet TAKE 1 TABLET BY MOUTH THREE TIMES DAILY WITH A MEAL FOR 7 DAYS  . magnesium oxide (MAG-OX) 400 MG tablet Take by mouth.  . medroxyPROGESTERone (DEPO-PROVERA) 150 MG/ML injection Inject 150 mg into the muscle every 3 (three) months. Last injection June 2015  . medroxyPROGESTERone Acetate 150 MG/ML SUSY INJECT 1 ML INTO THE MUSCLE EVERY 3 MONTHS  . meloxicam (MOBIC) 15 MG tablet TAKE 1 TABLET BY MOUTH EVERY DAY  . omeprazole (PRILOSEC) 40 MG capsule Take 40 mg by mouth daily.  . potassium chloride SA (KLOR-CON M20) 20 MEQ tablet Take by mouth.  . pregabalin (LYRICA) 75 MG capsule Take by mouth.  . pregabalin (LYRICA) 75 MG capsule Take 1 capsule by mouth twice daily  . rizatriptan (MAXALT-MLT) 10 MG disintegrating tablet   . sertraline (ZOLOFT) 100 MG tablet Take 1 tablet (100 mg total) by mouth daily.  . SUMAtriptan (IMITREX) 20 MG/ACT nasal spray 1 SPRAY INTO LEFT NOSTRIL ONCE AS NEEDED FOR MIGRAINE. MAY REPEAT AFTER 2 HOURS IF NEEDED.  Marland Kitchen traZODone (DESYREL) 150 MG tablet Take 1 tablet (150 mg total) by mouth at bedtime.  . triamterene-hydrochlorothiazide (MAXZIDE-25)  37.5-25 MG tablet Take 0.5 tablets by mouth daily.    Past Medical History:  Diagnosis Date  . Anxiety   . Depression   . DVT (deep venous thrombosis) ([place])   . Ear infection     Past Surgical History:  Procedure Laterality Date  . CHOLECYSTECTOMY    . VEIN SURGERY Bilateral    vein surgery both legs    Social History Social History   Tobacco Use  . Smoking status: Never Smoker  . Smokeless tobacco: Never Used  Substance Use Topics    . Alcohol use: No    Alcohol/week: 0.0 standard drinks  . Drug use: No    Family History Family History  Problem Relation Age of Onset  . Panic disorder Mother   . Dementia Paternal Grandfather   No family history of bleeding/clotting disorders, porphyria or autoimmune disease   Allergies  Allergen Reactions  . Influenza Vaccines Other (See Comments)    Very sick and was hospitalized   . Sulfa Antibiotics Swelling  . Topiramate Other (See Comments)    Mental fogginess, tingling, numbness     REVIEW OF SYSTEMS (Negative unless checked)  Constitutional: [] Weight loss  [] Fever  [] Chills Cardiac: [] Chest pain   [] Chest pressure   [] Palpitations   [] Shortness of breath when laying flat   [] Shortness of breath with exertion. Vascular:  [] Pain in legs with walking   [x] Pain in legs at rest  [x] History of DVT   [] Phlebitis   [x] Swelling in legs   [x] Varicose veins   [] Non-healing ulcers Pulmonary:   [] Uses home oxygen   [] Productive cough   [] Hemoptysis   [] Wheeze  [] COPD   [] Asthma Neurologic:  [] Dizziness   [] Seizures   [] History of stroke   [] History of TIA  [] Aphasia   [] Vissual changes   [] Weakness or numbness in arm   [] Weakness or numbness in leg Musculoskeletal:   [x] Joint swelling   [x] Joint pain   [] Low back pain Hematologic:  [] Easy bruising  [] Easy bleeding   [] Hypercoagulable state   [] Anemic Gastrointestinal:  [] Diarrhea   [] Vomiting  [x] Gastroesophageal reflux/heartburn   [] Difficulty swallowing. Genitourinary:  [] Chronic kidney disease   [] Difficult urination  [] Frequent urination   [] Blood in urine Skin:  [] Rashes   [] Ulcers  Psychological:  [] History of anxiety   []  History of major depression.  Physical Examination  Vitals:   12/12/19 0921  BP: 120/71  Pulse: 90  Resp: 16  Weight: (!) 303 lb 9.6 oz (137.7 kg)   Body mass index is 49 kg/m. Gen: WD/WN, NAD Head: [place]/AT, No temporalis wasting.  Ear/Nose/Throat: Hearing grossly intact, nares w/o erythema or  drainage, poor dentition Eyes: PER, EOMI, sclera nonicteric.  Neck: Supple, no masses.  No bruit or JVD.  Pulmonary:  Good air movement, clear to auscultation bilaterally, no use of accessory muscles.  Cardiac: RRR, normal S1, S2, no Murmurs. Vascular: scattered varicosities present bilaterally.  Moderate to severe venous stasis changes to the legs bilaterally.  3-4+ firm pitting edema Vessel Right Left  Radial Palpable Palpable  PT Palpable Palpable  DP Palpable Palpable  Gastrointestinal: soft, non-distended. No guarding/no peritoneal signs.  Musculoskeletal: M/S 5/5 throughout.  No deformity or atrophy.  Neurologic: CN 2-12 intact. Pain and light touch intact in extremities.  Symmetrical.  Speech is fluent. Motor exam as listed above. Psychiatric: Judgment intact, Mood & affect appropriate for pt's clinical situation. Dermatologic: venous rashes no ulcers noted.  No changes consistent with cellulitis. Lymph : No Cervical lymphadenopathy, no lichenification  or skin changes of chronic lymphedema.  CBC Lab Results  Component Value Date   WBC 9.1 01/26/2019   HGB 13.4 01/26/2019   HCT 39.0 01/26/2019   MCV 83.9 01/26/2019   PLT 228 01/26/2019    BMET    Component Value Date/Time   NA 136 01/26/2019 2159   NA 140 12/15/2014 2220   K 3.3 (L) 01/26/2019 2159   K 3.6 12/15/2014 2220   CL 106 01/26/2019 2159   CL 106 12/15/2014 2220   CO2 24 01/26/2019 2159   CO2 29 12/15/2014 2220   GLUCOSE 100 (H) 01/26/2019 2159   GLUCOSE 85 12/15/2014 2220   BUN 13 01/26/2019 2159   BUN 12 12/15/2014 2220   CREATININE 0.75 01/26/2019 2159   CREATININE 0.94 12/15/2014 2220   CALCIUM 9.2 01/26/2019 2159   CALCIUM 9.0 12/15/2014 2220   GFRNONAA >60 01/26/2019 2159   GFRNONAA >60 12/15/2014 2220   GFRAA >60 01/26/2019 2159   GFRAA >60 12/15/2014 2220   CrCl cannot be calculated (Patient's most recent lab result is older than the maximum 21 days allowed.).  COAG No results found for:  INR, PROTIME  Radiology No results found.   Assessment/Plan 1. Venous insufficiency No surgery or intervention at this point in time.    I have had a long discussion with the patient regarding venous insufficiency and why it  causes symptoms. I have discussed with the patient the chronic skin changes that accompany venous insufficiency and the long term sequela such as infection and ulceration.  Patient will begin wearing graduated compression stockings class 1 (20-30 mmHg) or compression wraps on a daily basis a prescription was given. The patient will put the stockings on first thing in the morning and removing them in the evening. The patient is instructed specifically not to sleep in the stockings.    In addition, behavioral modification including several periods of elevation of the lower extremities during the day will be continued. I have demonstrated that proper elevation is a position with the ankles at heart level.  The patient is instructed to begin routine exercise, especially walking on a daily basis  Patient should undergo duplex ultrasound of the venous system to ensure that DVT or reflux is not present.  Following the review of the ultrasound the patient will follow up in 2-3 months to reassess the degree of swelling and the control that graduated compression stockings or compression wraps  is offering.   The patient can be assessed for a Lymph Pump at that time  - VENOUS REFLUX; Future  2. Gastroesophageal reflux disease without esophagitis Continue PPI as already ordered, this medication has been reviewed and there are no changes at this time.  Avoidence of caffeine and alcohol  Moderate elevation of the head of the bed   3. Lymphedema of both lower extremities No surgery or intervention at this point in time.    I have had a long discussion with the patient regarding venous insufficiency and why it  causes symptoms. I have discussed with the patient the chronic skin  changes that accompany venous insufficiency and the long term sequela such as infection and ulceration.  Patient will begin wearing graduated compression stockings class 1 (20-30 mmHg) or compression wraps on a daily basis a prescription was given. The patient will put the stockings on first thing in the morning and removing them in the evening. The patient is instructed specifically not to sleep in the stockings.    In addition,  behavioral modification including several periods of elevation of the lower extremities during the day will be continued. I have demonstrated that proper elevation is a position with the ankles at heart level.  The patient is instructed to begin routine exercise, especially walking on a daily basis  Patient should undergo duplex ultrasound of the venous system to ensure that DVT or reflux is not present.  Following the review of the ultrasound the patient will follow up in 2-3 months to reassess the degree of swelling and the control that graduated compression stockings or compression wraps  is offering.   The patient can be assessed for a Lymph Pump at that time  4. Essential hypertension Continue antihypertensive medications as already ordered, these medications have been reviewed and there are no changes at this time.    Hortencia Pilar, MD  12/12/2019 9:24 AM

## 2019-12-20 ENCOUNTER — Other Ambulatory Visit: Payer: Self-pay

## 2019-12-20 ENCOUNTER — Ambulatory Visit (INDEPENDENT_AMBULATORY_CARE_PROVIDER_SITE_OTHER): Payer: 59 | Admitting: Psychiatry

## 2019-12-20 ENCOUNTER — Encounter: Payer: Self-pay | Admitting: Psychiatry

## 2019-12-20 DIAGNOSIS — F3342 Major depressive disorder, recurrent, in full remission: Secondary | ICD-10-CM | POA: Diagnosis not present

## 2019-12-20 DIAGNOSIS — F411 Generalized anxiety disorder: Secondary | ICD-10-CM

## 2019-12-20 DIAGNOSIS — G4700 Insomnia, unspecified: Secondary | ICD-10-CM

## 2019-12-20 MED ORDER — SERTRALINE HCL 100 MG PO TABS: 100.0000 mg | ORAL_TABLET | Freq: Every day | ORAL | 1 refills | Status: DC

## 2019-12-20 MED ORDER — ESZOPICLONE 1 MG PO TABS: 1.0000 mg | ORAL_TABLET | Freq: Every evening | ORAL | 0 refills | Status: DC | PRN

## 2019-12-20 NOTE — Patient Instructions (Signed)
Eszopiclone tablets What is this medicine? ESZOPICLONE (es ZOE pi clone) is used to treat insomnia. This medicine helps you to fall asleep and sleep through the night. This medicine may be used for other purposes; ask your health care provider or pharmacist if you have questions. COMMON BRAND NAME(S): Lunesta What should I tell my health care provider before I take this medicine? They need to know if you have any of these conditions:  depression  history of a drug or alcohol abuse problem  liver disease  lung or breathing disease  sleep-walking, driving, eating or other activity while not fully awake after taking a sleep medicine  suicidal thoughts  an unusual or allergic reaction to eszopiclone, other medicines, foods, dyes, or preservatives  pregnant or trying to get pregnant  breast-feeding How should I use this medicine? Take this medicine by mouth with a glass of water. Follow the directions on the prescription label. It is better to take this medicine on an empty stomach and only when you are ready for bed. Do not take your medicine more often than directed. If you have been taking this medicine for several weeks and suddenly stop taking it, you may get unpleasant withdrawal symptoms. Your doctor or health care professional may want to gradually reduce the dose. Do not stop taking this medicine on your own. Always follow your doctor or health care professional's advice. Talk to your pediatrician regarding the use of this medicine in children. Special care may be needed. Overdosage: If you think you have taken too much of this medicine contact a poison control center or emergency room at once. NOTE: This medicine is only for you. Do not share this medicine with others. What if I miss a dose? This does not apply. This medicine should only be taken immediately before going to sleep. Do not take double or extra doses. What may interact with this medicine?  herbal medicines like  kava kava, melatonin, St. John's wort and valerian  lorazepam  medicines for fungal infections like ketoconazole, fluconazole, or itraconazole  olanzapine This list may not describe all possible interactions. Give your health care provider a list of all the medicines, herbs, non-prescription drugs, or dietary supplements you use. Also tell them if you smoke, drink alcohol, or use illegal drugs. Some items may interact with your medicine. What should I watch for while using this medicine? Visit your doctor or health care professional for regular checks on your progress. Keep a regular sleep schedule by going to bed at about the same time nightly. Avoid caffeine-containing drinks in the evening hours, as caffeine can cause trouble with falling asleep. Talk to your doctor if you still have trouble sleeping. After taking this medicine, you may get up out of bed and do an activity that you do not know you are doing. The next morning, you may have no memory of this. Activities include driving a car ("sleep-driving"), making and eating food, talking on the phone, sexual activity, and sleep-walking. Serious injuries have occurred. Stop the medicine and call your doctor right away if you find out you have done any of these activities. Do not take this medicine if you have used alcohol that evening. Do not take it if you have taken another medicine for sleep. The risk of doing these sleep-related activities is higher. Do not take this medicine unless you are able to stay in bed for a full night (7 to 8 hours) before you must be active again. You may have a   decrease in mental alertness the day after use, even if you feel that you are fully awake. Tell your doctor if you will need to perform activities requiring full alertness, such as driving, the next day. Do not stand or sit up quickly after taking this medicine, especially if you are an older patient. This reduces the risk of dizzy or fainting spells. If you or  your family notice any changes in your behavior, such as new or worsening depression, thoughts of harming yourself, anxiety, other unusual or disturbing thoughts, or memory loss, call your doctor right away. After you stop taking this medicine, you may have trouble falling asleep. This is called rebound insomnia. This problem usually goes away on its own after 1 or 2 nights. What side effects may I notice from receiving this medicine? Side effects that you should report to your doctor or health care professional as soon as possible:  allergic reactions like skin rash, itching or hives, swelling of the face, lips, or tongue  changes in vision  confusion  depressed mood  feeling faint or lightheaded, falls  hallucinations  problems with balance, speaking, walking  restlessness, excitability, or feelings of agitation  unusual activities while not fully awake like driving, eating, making phone calls Side effects that usually do not require medical attention (report to your doctor or health care professional if they continue or are bothersome):  dizziness, or daytime drowsiness, sometimes called a hangover effect  headache This list may not describe all possible side effects. Call your doctor for medical advice about side effects. You may report side effects to FDA at 1-800-FDA-1088. Where should I keep my medicine? Keep out of the reach of children. This medicine can be abused. Keep your medicine in a safe place to protect it from theft. Do not share this medicine with anyone. Selling or giving away this medicine is dangerous and against the law. This medicine may cause accidental overdose and death if taken by other adults, children, or pets. Mix any unused medicine with a substance like cat litter or coffee grounds. Then throw the medicine away in a sealed container like a sealed bag or a coffee can with a lid. Do not use the medicine after the expiration date. Store at room temperature  between 15 and 30 degrees C (59 and 86 degrees F). NOTE: This sheet is a summary. It may not cover all possible information. If you have questions about this medicine, talk to your doctor, pharmacist, or health care provider.  2020 Elsevier/Gold Standard (2018-06-04 11:57:05)  

## 2019-12-20 NOTE — Progress Notes (Signed)
Virtual Visit via Video Note  I connected with Evelyn Flores on 12/20/19 at 11:00 AM EST by a video enabled telemedicine application and verified that I am speaking with the correct person using two identifiers.   I discussed the limitations of evaluation and management by telemedicine and the availability of in person appointments. The patient expressed understanding and agreed to proceed.     I discussed the assessment and treatment plan with the patient. The patient was provided an opportunity to ask questions and all were answered. The patient agreed with the plan and demonstrated an understanding of the instructions.   The patient was advised to call back or seek an in-person evaluation if the symptoms worsen or if the condition fails to improve as anticipated.   [facility] MD OP Progress Note  12/20/2019 12:50 PM Evelyn Flores  MRN:  [id number]  Chief Complaint:  Chief Complaint    Follow-up     HPI: Evelyn Flores is a 33 year old Caucasian female, single, lives at [place] with parents, employed, has a history of MDD, GAD was evaluated by telemedicine today.  Patient today reports she continues to work and has shift changes at work.  That continues to have an impact on her sleep.  Patient continues to have sleep problems and feels unrested when she wakes up.  She is on the trazodone which helps to some extent.  Patient was referred for a sleep study previously however could not get it done due to the high co-pay.  Patient reported that she was previously diagnosed with mild sleep apnea several years ago.  Patient however reports mood symptoms as fair.  She denies any depression or anxiety symptoms.  She is compliant on Zoloft.  Patient denies any suicidality, homicidality or perceptual disturbances.  Patient denies any other concerns today. Visit Diagnosis:    ICD-10-CM   1. MDD (major depressive disorder), recurrent, in full remission ([place])  F33.42   2. Insomnia, unspecified type   G47.00 eszopiclone (LUNESTA) 1 MG TABS tablet  3. GAD (generalized anxiety disorder)  F41.1 sertraline (ZOLOFT) 100 MG tablet    Past Psychiatric History: I have reviewed past psychiatric history from my progress note on 03/18/2018.  Past Medical History:  Past Medical History:  Diagnosis Date  . Anxiety   . Depression   . DVT (deep venous thrombosis) ([place])   . Ear infection     Past Surgical History:  Procedure Laterality Date  . CHOLECYSTECTOMY    . VEIN SURGERY Bilateral    vein surgery both legs    Family Psychiatric History: I have reviewed family psychiatric history from my progress note on 03/18/2018.  Family History:  Family History  Problem Relation Age of Onset  . Panic disorder Mother   . Dementia Paternal Grandfather     Social History: I have reviewed social history from my progress note on 03/18/2018. Social History   Socioeconomic History  . Marital status: Single    Spouse name: Not on file  . Number of children: 0  . Years of education: Not on file  . Highest education level: High school graduate  Occupational History  . Occupation: sport endeavors    Comment: fulltime  Tobacco Use  . Smoking status: Never Smoker  . Smokeless tobacco: Never Used  Substance and Sexual Activity  . Alcohol use: No    Alcohol/week: 0.0 standard drinks  . Drug use: No  . Sexual activity: Yes    Birth control/protection: Injection, Condom  Other Topics  Concern  . Not on file  Social History Narrative  . Not on file   Social Determinants of Health   Financial Resource Strain:   . Difficulty of Paying Living Expenses: Not on file  Food Insecurity:   . Worried About Charity fundraiser in the Last Year: Not on file  . Ran Out of Food in the Last Year: Not on file  Transportation Needs:   . Lack of Transportation (Medical): Not on file  . Lack of Transportation (Non-Medical): Not on file  Physical Activity:   . Days of Exercise per Week: Not on file  . Minutes  of Exercise per Session: Not on file  Stress:   . Feeling of Stress : Not on file  Social Connections:   . Frequency of Communication with Friends and Family: Not on file  . Frequency of Social Gatherings with Friends and Family: Not on file  . Attends Religious Services: Not on file  . Active Member of Clubs or Organizations: Not on file  . Attends Archivist Meetings: Not on file  . Marital Status: Not on file    Allergies:  Allergies  Allergen Reactions  . Influenza Vaccines Other (See Comments)    Very sick and was hospitalized   . Sulfa Antibiotics Swelling  . Topiramate Other (See Comments)    Mental fogginess, tingling, numbness    Metabolic Disorder Labs: No results found for: HGBA1C, MPG No results found for: PROLACTIN No results found for: CHOL, TRIG, HDL, CHOLHDL, VLDL, LDLCALC No results found for: TSH  Therapeutic Level Labs: No results found for: LITHIUM No results found for: VALPROATE No components found for:  CBMZ  Current Medications: Current Outpatient Medications  Medication Sig Dispense Refill  . acetaminophen (TYLENOL) 325 MG tablet Take by mouth.    Marland Kitchen aspirin-acetaminophen-caffeine (EXCEDRIN MIGRAINE) 250-250-65 MG tablet Take by mouth.    . baclofen (LIORESAL) 10 MG tablet TAKE 1 TABLET BY MOUTH 2 TIMES DAILY AS NEEDED  3  . eszopiclone (LUNESTA) 1 MG TABS tablet Take 1 tablet (1 mg total) by mouth at bedtime as needed for sleep. Take immediately before bedtime 15 tablet 0  . fluticasone (FLONASE) 50 MCG/ACT nasal spray Place 2 sprays into both nostrils daily.  12  . gabapentin (NEURONTIN) 300 MG capsule   1  . ibuprofen (ADVIL,MOTRIN) 600 MG tablet TAKE 1 TABLET BY MOUTH THREE TIMES DAILY WITH A MEAL FOR 7 DAYS  0  . magnesium oxide (MAG-OX) 400 MG tablet Take by mouth.    . medroxyPROGESTERone (DEPO-PROVERA) 150 MG/ML injection Inject 150 mg into the muscle every 3 (three) months. Last injection June 2015    . medroxyPROGESTERone Acetate  150 MG/ML SUSY INJECT 1 ML INTO THE MUSCLE EVERY 3 MONTHS    . meloxicam (MOBIC) 15 MG tablet TAKE 1 TABLET BY MOUTH EVERY DAY 30 tablet 3  . omeprazole (PRILOSEC) 40 MG capsule Take 40 mg by mouth daily.    Marland Kitchen oxyCODONE-acetaminophen (PERCOCET) 5-325 MG tablet Take 8 tablets by mouth every 6 (six) hours as needed. (Patient not taking: Reported on 12/12/2019) 20 tablet 0  . potassium chloride SA (KLOR-CON M20) 20 MEQ tablet Take by mouth.    . pregabalin (LYRICA) 75 MG capsule Take by mouth.    . pregabalin (LYRICA) 75 MG capsule Take 1 capsule by mouth twice daily    . rizatriptan (MAXALT-MLT) 10 MG disintegrating tablet     . sertraline (ZOLOFT) 100 MG tablet Take 1 tablet (  100 mg total) by mouth daily. 90 tablet 1  . SUMAtriptan (IMITREX) 20 MG/ACT nasal spray 1 SPRAY INTO LEFT NOSTRIL ONCE AS NEEDED FOR MIGRAINE. MAY REPEAT AFTER 2 HOURS IF NEEDED.  6  . triamterene-hydrochlorothiazide (MAXZIDE-25) 37.5-25 MG tablet Take 0.5 tablets by mouth daily.  6   No current facility-administered medications for this visit.     Musculoskeletal: Strength & Muscle Tone: UTA Gait & Station: normal Patient leans: N/A  Psychiatric Specialty Exam: Review of Systems  Psychiatric/Behavioral: Positive for sleep disturbance.  All other systems reviewed and are negative.   There were no vitals taken for this visit.There is no height or weight on file to calculate BMI.  General Appearance: Casual  Eye Contact:  Fair  Speech:  Clear and Coherent  Volume:  Normal  Mood:  Euthymic  Affect:  Congruent  Thought Process:  Goal Directed and Descriptions of Associations: Intact  Orientation:  Full (Time, Place, and Person)  Thought Content: Logical   Suicidal Thoughts:  No  Homicidal Thoughts:  No  Memory:  Immediate;   Fair Recent;   Fair Remote;   Fair  Judgement:  Fair  Insight:  Fair  Psychomotor Activity:  Normal  Concentration:  Concentration: Fair and Attention Span: Fair  Recall:  [employer] of Knowledge: Fair  Language: Fair  Akathisia:  No  Handed:  Right  AIMS (if indicated): Denies tremors, rigidity  Assets:  Communication Skills Desire for Improvement Housing Social Support  ADL's:  Intact  Cognition: WNL  Sleep:  Poor   Screenings: PHQ2-9     Nutrition from 05/07/2017 in [place]  PHQ-2 Total Score  0       Assessment and Plan: Shondi is a 33 year old Caucasian female, single, employed, lives in [place], has a history of MDD, GAD was evaluated by telemedicine today.  Patient with psychosocial stressors of her own health issues, current pandemic, job related stressors.  She continues to struggle with sleep although mood wise she is making progress.  Plan as noted below.  Plan MDD in remission Zoloft 100 mg p.o. daily.  GAD-stable Zoloft 100 mg p.o. daily  Insomnia unspecified-patient with a remote history of obstructive sleep apnea. Discontinue trazodone for lack of benefit. Start Lunesta 1 mg p.o. nightly Patient was referred for sleep study however could not afford it in the past.  Follow-up in clinic in 4 weeks or sooner if needed.  January 28 at 9:30 AM  I have spent atleast 15 minutes non face to face with patient today. More than 50 % of the time was spent for psychoeducation and supportive psychotherapy and care coordination. This note was generated in part or whole with voice recognition software. Voice recognition is usually quite accurate but there are transcription errors that can and very often do occur. I apologize for any typographical errors that were not detected and corrected.       Ursula Alert, MD 12/20/2019, 12:50 PM

## 2019-12-25 ENCOUNTER — Encounter (INDEPENDENT_AMBULATORY_CARE_PROVIDER_SITE_OTHER): Payer: Self-pay | Admitting: Vascular Surgery

## 2019-12-27 ENCOUNTER — Ambulatory Visit: Payer: Managed Care, Other (non HMO) | Attending: Internal Medicine

## 2019-12-27 DIAGNOSIS — Z20822 Contact with and (suspected) exposure to covid-19: Secondary | ICD-10-CM

## 2019-12-29 ENCOUNTER — Telehealth: Payer: Self-pay

## 2019-12-29 LAB — NOVEL CORONAVIRUS, NAA: SARS-CoV-2, NAA: DETECTED — AB

## 2019-12-29 NOTE — Telephone Encounter (Signed)
Attempted to contact patient - left voicemail.

## 2019-12-29 NOTE — Telephone Encounter (Signed)
she is still wakin up all time during the night. i is like it doesnt last.

## 2020-01-02 ENCOUNTER — Telehealth: Payer: Self-pay

## 2020-01-02 DIAGNOSIS — G4700 Insomnia, unspecified: Secondary | ICD-10-CM

## 2020-01-02 MED ORDER — ESZOPICLONE 2 MG PO TABS: 2.0000 mg | ORAL_TABLET | Freq: Every evening | ORAL | 0 refills | Status: DC | PRN

## 2020-01-02 NOTE — Telephone Encounter (Signed)
pt states she nay need to go up on the sleeping medication. states she does not stay asleep.

## 2020-01-02 NOTE — Telephone Encounter (Signed)
Returned call to patient.  She reports 1 mg helps her to sleep however she is waking up.  Discussed with patient increase to 2 mg for a week and if that does not work to increase to 3 mg after that.  She will call back if she continues to have trouble.  We will sent Lunesta 2 mg to pharmacy today.

## 2020-01-19 ENCOUNTER — Ambulatory Visit (INDEPENDENT_AMBULATORY_CARE_PROVIDER_SITE_OTHER): Payer: 59 | Admitting: Psychiatry

## 2020-01-19 ENCOUNTER — Other Ambulatory Visit: Payer: Self-pay

## 2020-01-19 ENCOUNTER — Encounter: Payer: Self-pay | Admitting: Psychiatry

## 2020-01-19 DIAGNOSIS — F411 Generalized anxiety disorder: Secondary | ICD-10-CM

## 2020-01-19 DIAGNOSIS — G4700 Insomnia, unspecified: Secondary | ICD-10-CM | POA: Diagnosis not present

## 2020-01-19 DIAGNOSIS — F3342 Major depressive disorder, recurrent, in full remission: Secondary | ICD-10-CM | POA: Diagnosis not present

## 2020-01-19 MED ORDER — HYDROXYZINE HCL 25 MG PO TABS: 12.5000 mg | ORAL_TABLET | Freq: Two times a day (BID) | ORAL | 1 refills | Status: DC | PRN

## 2020-01-19 MED ORDER — SERTRALINE HCL 100 MG PO TABS: 150.0000 mg | ORAL_TABLET | Freq: Every day | ORAL | 0 refills | Status: DC

## 2020-01-19 MED ORDER — ESZOPICLONE 2 MG PO TABS: 2.0000 mg | ORAL_TABLET | Freq: Every evening | ORAL | 0 refills | Status: DC | PRN

## 2020-01-19 NOTE — Progress Notes (Signed)
Provider Location : [facility] Patient Location : Home  Virtual Visit via Video Note  I connected with Evelyn Flores on 01/19/20 at  9:30 AM EST by a video enabled telemedicine application and verified that I am speaking with the correct person using two identifiers.   I discussed the limitations of evaluation and management by telemedicine and the availability of in person appointments. The patient expressed understanding and agreed to proceed.     I discussed the assessment and treatment plan with the patient. The patient was provided an opportunity to ask questions and all were answered. The patient agreed with the plan and demonstrated an understanding of the instructions.   The patient was advised to call back or seek an in-person evaluation if the symptoms worsen or if the condition fails to improve as anticipated.   [facility] MD OP Progress Note  01/19/2020 1:33 PM Evelyn Flores  MRN:  [id number]  Chief Complaint:  Chief Complaint    Follow-up     HPI: Evelyn Flores is a 34 year old Caucasian female, single, lives at [place] with parents, employed, has a history of MDD, GAD was evaluated by telemedicine today.  Patient today reports she is currently anxious, her anxiety symptoms are.  This has been going on since the past several weeks.  She was recently diagnosed with COVID-19.  She does not know how she got exposed, may have been at work.  She currently denies any significant symptoms of COVID-19.  Patient denies suicidality, homicidality or perceptual disturbances.  Reports sleep is good.  She is tolerating the Lunesta well.  Patient does report several psychosocial stressors including her health and the current pandemic and job-related stressors.  She is interested in restarting psychotherapy sessions.  She used to see Ms. [name] previously.  She agrees to give her a call back.  Patient denies any other concerns today. Visit Diagnosis:    ICD-10-CM   1. MDD (major  depressive disorder), recurrent, in full remission ([place])  F33.42 hydrOXYzine (ATARAX/VISTARIL) 25 MG tablet  2. Insomnia, unspecified type  G47.00 eszopiclone (LUNESTA) 2 MG TABS tablet  3. GAD (generalized anxiety disorder)  F41.1 sertraline (ZOLOFT) 100 MG tablet    hydrOXYzine (ATARAX/VISTARIL) 25 MG tablet    Past Psychiatric History: I have reviewed past psychiatric history from my progress note on 03/18/2018.  Past Medical History:  Past Medical History:  Diagnosis Date  . Anxiety   . Depression   . DVT (deep venous thrombosis) ([place])   . Ear infection     Past Surgical History:  Procedure Laterality Date  . CHOLECYSTECTOMY    . VEIN SURGERY Bilateral    vein surgery both legs    Family Psychiatric History: Reviewed family psychiatric history from my progress note on 03/18/2018. Family History:  Family History  Problem Relation Age of Onset  . Panic disorder Mother   . Dementia Paternal Grandfather     Social History: Reviewed social history from my progress note on 03/18/2018. Social History   Socioeconomic History  . Marital status: Single    Spouse name: Not on file  . Number of children: 0  . Years of education: Not on file  . Highest education level: High school graduate  Occupational History  . Occupation: sport endeavors    Comment: fulltime  Tobacco Use  . Smoking status: Never Smoker  . Smokeless tobacco: Never Used  Substance and Sexual Activity  . Alcohol use: No    Alcohol/week: 0.0 standard drinks  .  Drug use: No  . Sexual activity: Yes    Birth control/protection: Injection, Condom  Other Topics Concern  . Not on file  Social History Narrative  . Not on file   Social Determinants of Health   Financial Resource Strain:   . Difficulty of Paying Living Expenses: Not on file  Food Insecurity:   . Worried About Charity fundraiser in the Last Year: Not on file  . Ran Out of Food in the Last Year: Not on file  Transportation Needs:   . Lack  of Transportation (Medical): Not on file  . Lack of Transportation (Non-Medical): Not on file  Physical Activity:   . Days of Exercise per Week: Not on file  . Minutes of Exercise per Session: Not on file  Stress:   . Feeling of Stress : Not on file  Social Connections:   . Frequency of Communication with Friends and Family: Not on file  . Frequency of Social Gatherings with Friends and Family: Not on file  . Attends Religious Services: Not on file  . Active Member of Clubs or Organizations: Not on file  . Attends Archivist Meetings: Not on file  . Marital Status: Not on file    Allergies:  Allergies  Allergen Reactions  . Influenza Vaccines Other (See Comments)    Very sick and was hospitalized   . Sulfa Antibiotics Swelling  . Topiramate Other (See Comments)    Mental fogginess, tingling, numbness    Metabolic Disorder Labs: No results found for: HGBA1C, MPG No results found for: PROLACTIN No results found for: CHOL, TRIG, HDL, CHOLHDL, VLDL, LDLCALC No results found for: TSH  Therapeutic Level Labs: No results found for: LITHIUM No results found for: VALPROATE No components found for:  CBMZ  Current Medications: Current Outpatient Medications  Medication Sig Dispense Refill  . acetaminophen (TYLENOL) 325 MG tablet Take by mouth.    Marland Kitchen aspirin-acetaminophen-caffeine (EXCEDRIN MIGRAINE) 250-250-65 MG tablet Take by mouth.    . baclofen (LIORESAL) 10 MG tablet TAKE 1 TABLET BY MOUTH 2 TIMES DAILY AS NEEDED  3  . [START ON 01/31/2020] eszopiclone (LUNESTA) 2 MG TABS tablet Take 1 tablet (2 mg total) by mouth at bedtime as needed for sleep. Take immediately before bedtime 30 tablet 0  . fluticasone (FLONASE) 50 MCG/ACT nasal spray Place 2 sprays into both nostrils daily.  12  . gabapentin (NEURONTIN) 300 MG capsule   1  . hydrOXYzine (ATARAX/VISTARIL) 25 MG tablet Take 0.5-1 tablets (12.5-25 mg total) by mouth 2 (two) times daily as needed. For severe anxiety  attacks only 60 tablet 1  . ibuprofen (ADVIL,MOTRIN) 600 MG tablet TAKE 1 TABLET BY MOUTH THREE TIMES DAILY WITH A MEAL FOR 7 DAYS  0  . magnesium oxide (MAG-OX) 400 MG tablet Take by mouth.    . medroxyPROGESTERone (DEPO-PROVERA) 150 MG/ML injection Inject 150 mg into the muscle every 3 (three) months. Last injection June 2015    . medroxyPROGESTERone Acetate 150 MG/ML SUSY INJECT 1 ML INTO THE MUSCLE EVERY 3 MONTHS    . meloxicam (MOBIC) 15 MG tablet TAKE 1 TABLET BY MOUTH EVERY DAY 30 tablet 3  . omeprazole (PRILOSEC) 40 MG capsule Take 40 mg by mouth daily.    Marland Kitchen oxyCODONE-acetaminophen (PERCOCET) 5-325 MG tablet Take 8 tablets by mouth every 6 (six) hours as needed. (Patient not taking: Reported on 12/12/2019) 20 tablet 0  . potassium chloride SA (KLOR-CON M20) 20 MEQ tablet Take by mouth.    Marland Kitchen  pregabalin (LYRICA) 75 MG capsule Take by mouth.    . pregabalin (LYRICA) 75 MG capsule Take 1 capsule by mouth twice daily    . rizatriptan (MAXALT-MLT) 10 MG disintegrating tablet     . sertraline (ZOLOFT) 100 MG tablet Take 1.5 tablets (150 mg total) by mouth daily. 135 tablet 0  . SUMAtriptan (IMITREX) 20 MG/ACT nasal spray 1 SPRAY INTO LEFT NOSTRIL ONCE AS NEEDED FOR MIGRAINE. MAY REPEAT AFTER 2 HOURS IF NEEDED.  6  . triamterene-hydrochlorothiazide (MAXZIDE-25) 37.5-25 MG tablet Take 0.5 tablets by mouth daily.  6   No current facility-administered medications for this visit.     Musculoskeletal: Strength & Muscle Tone: UTA Gait & Station: normal Patient leans: N/A  Psychiatric Specialty Exam: Review of Systems  Psychiatric/Behavioral: The patient is nervous/anxious.   All other systems reviewed and are negative.   There were no vitals taken for this visit.There is no height or weight on file to calculate BMI.  General Appearance: Casual  Eye Contact:  Fair  Speech:  Normal Rate  Volume:  Normal  Mood:  Anxious  Affect:  Appropriate  Thought Process:  Goal Directed and  Descriptions of Associations: Intact  Orientation:  Full (Time, Place, and Person)  Thought Content: Logical   Suicidal Thoughts:  No  Homicidal Thoughts:  No  Memory:  Immediate;   Fair Recent;   Fair Remote;   Fair  Judgement:  Fair  Insight:  Fair  Psychomotor Activity:  Normal  Concentration:  Concentration: Fair and Attention Span: Fair  Recall:  [employer] of Knowledge: Fair  Language: Fair  Akathisia:  No  Handed:  Right  AIMS (if indicated): denies tremors, rigidity  Assets:  Communication Skills Desire for Improvement Housing Social Support  ADL's:  Intact  Cognition: WNL  Sleep:  Fair   Screenings: PHQ2-9     Nutrition from 05/07/2017 in [facility]  PHQ-2 Total Score  0       Assessment and Plan: Dorna is a 34 year old Caucasian female, single, employed, lives in [place], has a history of MDD, GAD was evaluated by telemedicine today.  Patient with psychosocial stressors of her own health issues, current pandemic.  She is currently struggling with anxiety and will benefit from medication readjustment.  Plan as noted below.  Plan MDD in remission Zoloft as prescribed  GAD-unstable Increase Zoloft to 150 mg p.o. daily Start hydroxyzine 12.5 to 25 mg p.o. twice daily as needed for anxiety attacks  Insomnia unspecified-patient with remote history of OSA Lunesta 2 mg p.o. nightly Patient was referred for sleep study however could not afford it in the past.  Patient advised to restart psychotherapy sessions, she will get in touch with her therapist Ms. [name].  Follow-up in clinic in 4 weeks or sooner if needed.  February 25 at 2:40 PM  I have spent atleast 20 minutes non face to face with patient today. More than 50 % of the time was spent for  ordering medications and test ,psychoeducation and supportive psychotherapy and care coordination,as well as documenting clinical information in electronic health record. This note was  generated in part or whole with voice recognition software. Voice recognition is usually quite accurate but there are transcription errors that can and very often do occur. I apologize for any typographical errors that were not detected and corrected.       Ursula Alert, MD 01/19/2020, 1:33 PM

## 2020-02-16 ENCOUNTER — Other Ambulatory Visit: Payer: Self-pay

## 2020-02-16 ENCOUNTER — Ambulatory Visit (INDEPENDENT_AMBULATORY_CARE_PROVIDER_SITE_OTHER): Payer: 59 | Admitting: Psychiatry

## 2020-02-16 ENCOUNTER — Ambulatory Visit: Payer: 59 | Admitting: Licensed Clinical Social Worker

## 2020-02-16 ENCOUNTER — Encounter: Payer: Self-pay | Admitting: Podiatry

## 2020-02-16 ENCOUNTER — Encounter: Payer: Self-pay | Admitting: Psychiatry

## 2020-02-16 ENCOUNTER — Ambulatory Visit (INDEPENDENT_AMBULATORY_CARE_PROVIDER_SITE_OTHER): Payer: Managed Care, Other (non HMO) | Admitting: Podiatry

## 2020-02-16 DIAGNOSIS — M2142 Flat foot [pes planus] (acquired), left foot: Secondary | ICD-10-CM

## 2020-02-16 DIAGNOSIS — M722 Plantar fascial fibromatosis: Secondary | ICD-10-CM | POA: Diagnosis not present

## 2020-02-16 DIAGNOSIS — M76822 Posterior tibial tendinitis, left leg: Secondary | ICD-10-CM | POA: Diagnosis not present

## 2020-02-16 DIAGNOSIS — M2141 Flat foot [pes planus] (acquired), right foot: Secondary | ICD-10-CM | POA: Diagnosis not present

## 2020-02-16 DIAGNOSIS — F411 Generalized anxiety disorder: Secondary | ICD-10-CM

## 2020-02-16 DIAGNOSIS — F3342 Major depressive disorder, recurrent, in full remission: Secondary | ICD-10-CM | POA: Diagnosis not present

## 2020-02-16 DIAGNOSIS — G4700 Insomnia, unspecified: Secondary | ICD-10-CM | POA: Diagnosis not present

## 2020-02-16 MED ORDER — METHYLPREDNISOLONE 4 MG PO TBPK: ORAL_TABLET | ORAL | 1 refills | Status: DC

## 2020-02-16 MED ORDER — ESZOPICLONE 2 MG PO TABS: 2.0000 mg | ORAL_TABLET | Freq: Every evening | ORAL | 1 refills | Status: DC | PRN

## 2020-02-16 NOTE — Progress Notes (Signed)
Provider Location : [facility] Patient Location : Home   Virtual Visit via Video Note  I connected with Evelyn Flores on 02/16/20 at  2:40 PM EST by a video enabled telemedicine application and verified that I am speaking with the correct person using two identifiers.   I discussed the limitations of evaluation and management by telemedicine and the availability of in person appointments. The patient expressed understanding and agreed to proceed.     I discussed the assessment and treatment plan with the patient. The patient was provided an opportunity to ask questions and all were answered. The patient agreed with the plan and demonstrated an understanding of the instructions.   The patient was advised to call back or seek an in-person evaluation if the symptoms worsen or if the condition fails to improve as anticipated.  [facility] MD OP Progress Note  02/16/2020 5:05 PM CATERIN PLACIDE  MRN:  [id number]  Chief Complaint:  Chief Complaint    Follow-up     HPI: Evelyn Flores is a 34 year old Caucasian female, single, lives at [place] with parents, employed, has a history of MDD, GAD was evaluated by telemedicine today.  Patient today reports she is currently struggling with left foot pain due to plantar fasciitis.  She reports she is in a lot of pain which is making her anxious.  She is on medications to help with the pain at this time.  She is tolerating it well.  She otherwise reports mood symptoms are stable.  She denies any significant depression.  Sleep is good except for her pain which can sometimes make it restless.  She reports work is going well.  Patient denies any suicidality, homicidality or perceptual disturbances.  Patient reports she had an appointment with her neurologist and has been referred for another sleep study.  She is hoping it will get approved this time.  Patient denies any other concerns today. Visit Diagnosis:    ICD-10-CM   1. MDD (major depressive disorder),  recurrent, in full remission ([place])  F33.42   2. GAD (generalized anxiety disorder)  F41.1   3. Insomnia, unspecified type  G47.00 eszopiclone (LUNESTA) 2 MG TABS tablet    Past Psychiatric History: I have reviewed past psychiatric history from my progress note on 03/18/2018  Past Medical History:  Past Medical History:  Diagnosis Date  . Anxiety   . Depression   . DVT (deep venous thrombosis) ([place])   . Ear infection     Past Surgical History:  Procedure Laterality Date  . CHOLECYSTECTOMY    . VEIN SURGERY Bilateral    vein surgery both legs    Family Psychiatric History: Reviewed family psychiatric history from my progress note on 03/18/2018  Family History:  Family History  Problem Relation Age of Onset  . Panic disorder Mother   . Dementia Paternal Grandfather     Social History: Reviewed social history from my progress note on 03/18/2018 Social History   Socioeconomic History  . Marital status: Single    Spouse name: Not on file  . Number of children: 0  . Years of education: Not on file  . Highest education level: High school graduate  Occupational History  . Occupation: sport endeavors    Comment: fulltime  Tobacco Use  . Smoking status: Never Smoker  . Smokeless tobacco: Never Used  Substance and Sexual Activity  . Alcohol use: No    Alcohol/week: 0.0 standard drinks  . Drug use: No  . Sexual activity: Yes  Birth control/protection: Injection, Condom  Other Topics Concern  . Not on file  Social History Narrative  . Not on file   Social Determinants of Health   Financial Resource Strain:   . Difficulty of Paying Living Expenses: Not on file  Food Insecurity:   . Worried About Charity fundraiser in the Last Year: Not on file  . Ran Out of Food in the Last Year: Not on file  Transportation Needs:   . Lack of Transportation (Medical): Not on file  . Lack of Transportation (Non-Medical): Not on file  Physical Activity:   . Days of Exercise per  Week: Not on file  . Minutes of Exercise per Session: Not on file  Stress:   . Feeling of Stress : Not on file  Social Connections:   . Frequency of Communication with Friends and Family: Not on file  . Frequency of Social Gatherings with Friends and Family: Not on file  . Attends Religious Services: Not on file  . Active Member of Clubs or Organizations: Not on file  . Attends Archivist Meetings: Not on file  . Marital Status: Not on file    Allergies:  Allergies  Allergen Reactions  . Influenza Vaccines Other (See Comments)    Very sick and was hospitalized   . Sulfa Antibiotics Swelling  . Topiramate Other (See Comments)    Mental fogginess, tingling, numbness    Metabolic Disorder Labs: No results found for: HGBA1C, MPG No results found for: PROLACTIN No results found for: CHOL, TRIG, HDL, CHOLHDL, VLDL, LDLCALC No results found for: TSH  Therapeutic Level Labs: No results found for: LITHIUM No results found for: VALPROATE No components found for:  CBMZ  Current Medications: Current Outpatient Medications  Medication Sig Dispense Refill  . acetaminophen (TYLENOL) 325 MG tablet Take by mouth.    Marland Kitchen aspirin-acetaminophen-caffeine (EXCEDRIN MIGRAINE) 250-250-65 MG tablet Take by mouth.    . baclofen (LIORESAL) 10 MG tablet TAKE 1 TABLET BY MOUTH 2 TIMES DAILY AS NEEDED  3  . [START ON 03/06/2020] eszopiclone (LUNESTA) 2 MG TABS tablet Take 1 tablet (2 mg total) by mouth at bedtime as needed for sleep. Take immediately before bedtime 30 tablet 1  . fluticasone (FLONASE) 50 MCG/ACT nasal spray Place 2 sprays into both nostrils daily.  12  . gabapentin (NEURONTIN) 300 MG capsule   1  . hydrOXYzine (ATARAX/VISTARIL) 25 MG tablet Take 0.5-1 tablets (12.5-25 mg total) by mouth 2 (two) times daily as needed. For severe anxiety attacks only 60 tablet 1  . ibuprofen (ADVIL,MOTRIN) 600 MG tablet TAKE 1 TABLET BY MOUTH THREE TIMES DAILY WITH A MEAL FOR 7 DAYS  0  .  magnesium oxide (MAG-OX) 400 MG tablet Take by mouth.    . medroxyPROGESTERone (DEPO-PROVERA) 150 MG/ML injection Inject 150 mg into the muscle every 3 (three) months. Last injection June 2015    . medroxyPROGESTERone Acetate 150 MG/ML SUSY INJECT 1 ML INTO THE MUSCLE EVERY 3 MONTHS    . meloxicam (MOBIC) 15 MG tablet TAKE 1 TABLET BY MOUTH EVERY DAY 30 tablet 3  . methylPREDNISolone (MEDROL DOSEPAK) 4 MG TBPK tablet Use as directed 30 tablet 1  . omeprazole (PRILOSEC) 40 MG capsule Take 40 mg by mouth daily.    . potassium chloride SA (KLOR-CON M20) 20 MEQ tablet Take by mouth.    . pregabalin (LYRICA) 75 MG capsule Take by mouth.    . pregabalin (LYRICA) 75 MG capsule Take 1 capsule  by mouth twice daily    . rizatriptan (MAXALT-MLT) 10 MG disintegrating tablet     . sertraline (ZOLOFT) 100 MG tablet Take 1.5 tablets (150 mg total) by mouth daily. 135 tablet 0  . SUMAtriptan (IMITREX) 20 MG/ACT nasal spray 1 SPRAY INTO LEFT NOSTRIL ONCE AS NEEDED FOR MIGRAINE. MAY REPEAT AFTER 2 HOURS IF NEEDED.  6  . triamterene-hydrochlorothiazide (MAXZIDE-25) 37.5-25 MG tablet Take 0.5 tablets by mouth daily.  6   No current facility-administered medications for this visit.     Musculoskeletal: Strength & Muscle Tone: UTA Gait & Station: normal Patient leans: N/A  Psychiatric Specialty Exam: Review of Systems  Musculoskeletal:       Foot pain- left  Psychiatric/Behavioral: Positive for sleep disturbance. The patient is nervous/anxious.   All other systems reviewed and are negative.   There were no vitals taken for this visit.There is no height or weight on file to calculate BMI.  General Appearance: Casual  Eye Contact:  Fair  Speech:  Normal Rate  Volume:  Normal  Mood:  Anxious due to pain  Affect:  Congruent  Thought Process:  Goal Directed and Descriptions of Associations: Intact  Orientation:  Full (Time, Place, and Person)  Thought Content: Logical   Suicidal Thoughts:  No   Homicidal Thoughts:  No  Memory:  Immediate;   Fair Recent;   Fair Remote;   Fair  Judgement:  Intact  Insight:  Fair  Psychomotor Activity:  Normal  Concentration:  Concentration: Fair and Attention Span: Fair  Recall:  [employer] of Knowledge: Fair  Language: Fair  Akathisia:  No  Handed:  Right  AIMS (if indicated):UTA  Assets:  Communication Skills Desire for Improvement Housing Social Support  ADL's:  Intact  Cognition: WNL  Sleep:  improving   Screenings: PHQ2-9     Nutrition from 05/07/2017 in [facility]  PHQ-2 Total Score  0       Assessment and Plan: Evelyn Flores is a 34 year old Caucasian female, single, employed, lives in [place], has a history of MDD, GAD was evaluated by telemedicine today.  Patient with psychosocial stressors of her own health issues, current pandemic.  Patient is currently struggling with foot pain otherwise reports her mood symptoms are stable.  Plan as noted below.  Plan MDD in remission Zoloft as prescribed  GAD-stable Zoloft 150 mg p.o. daily. Hydroxyzine 12.5 to 25 mg p.o. twice daily as needed for anxiety attacks  Insomnia-unspecified-patient with remote history of OSA Lunesta 2 mg p.o. nightly. Patient has been referred for another sleep study per neurology-pending  Patient has been advised to restart psychotherapy sessions-pending.  Follow-up in clinic in 6 weeks or sooner if needed.  I have spent atleast 20 minutes non face to face with patient today. More than 50 % of the time was spent for  ordering medications and test ,psychoeducation and supportive psychotherapy and care coordination,as well as documenting clinical information in electronic health record. This note was generated in part or whole with voice recognition software. Voice recognition is usually quite accurate but there are transcription errors that can and very often do occur. I apologize for any typographical errors that were not detected and  corrected.        Ursula Alert, MD 02/16/2020, 5:05 PM

## 2020-02-16 NOTE — Progress Notes (Signed)
This patient presents the office stating that she has redeveloped pain through the arch of her left foot.  She states that this pain that she is experiencing is now 7 out of 10.  She says she had a similar problem in 2019 which was treated by Dr. Milinda Pointer.  Patient was treated with orthoses and she has done well for 2 years.  She presents the office today stating that she has been painful for the last 10 days despite the fact that she is wearing her orthoses in her shoes.  No evidence of any redness swelling noted in her left foot.  She presents the office today for an evaluation and treatment of her left foot.  Vascular  Dorsalis pedis and posterior tibial pulses are palpable  B/L.  Capillary return  WNL.  Temperature gradient is  WNL.  Skin turgor  WNL  Sensorium  Senn Weinstein monofilament wire  WNL. Normal tactile sensation.  Nail Exam  Patient has normal nails with no evidence of bacterial or fungal infection.  Orthopedic  Exam  Muscle tone and muscle strength  WNL.  No limitations of motion feet  B/L.  No crepitus or joint effusion noted.  Foot type is unremarkable and digits show no abnormalities.  Pes planus.  Palpable pain at the navicular  Left foot.  No evidence of swelling or inflammation noted.  Palpable pain through arch left foot.  Skin  No open lesions.  Normal skin texture and turgor.  Plantar Fasciitis left foot  PTTD left foot.  ROV.  Discussed this condition with this patient.  Examined her left orthoses and found the left orthoses is not controlling her foot.  Therefore I added additional padding through the arch of the left foot to test to see if this controls her pain.  If this is successful she will be a candidate for new orthoses.  Patient was prescribed a Medrol Dosepak for the pain in her left foot.  Return to the clinic 10 days.  Gardiner Barefoot DPM

## 2020-02-27 ENCOUNTER — Encounter: Payer: Self-pay | Admitting: Podiatry

## 2020-02-27 ENCOUNTER — Other Ambulatory Visit: Payer: Self-pay

## 2020-02-27 ENCOUNTER — Ambulatory Visit (INDEPENDENT_AMBULATORY_CARE_PROVIDER_SITE_OTHER): Payer: Managed Care, Other (non HMO) | Admitting: Podiatry

## 2020-02-27 VITALS — Temp 98.4°F

## 2020-02-27 DIAGNOSIS — M76822 Posterior tibial tendinitis, left leg: Secondary | ICD-10-CM

## 2020-02-27 DIAGNOSIS — M2142 Flat foot [pes planus] (acquired), left foot: Secondary | ICD-10-CM | POA: Diagnosis not present

## 2020-02-27 DIAGNOSIS — M2141 Flat foot [pes planus] (acquired), right foot: Secondary | ICD-10-CM | POA: Diagnosis not present

## 2020-02-27 NOTE — Progress Notes (Signed)
This patient returns to the office for continued evaluation of a painful left foot.  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  She was treated with a Medrol Dosepak and given additional support through her orthoses left foot.  She says that she has improved but she still experiencing pain and discomfort.  She says her pain today is 5 out of 10.   she returns to the office today for continued evaluation and treatment of her left foot.  Vascular  Dorsalis pedis and posterior tibial pulses are palpable  B/L.  Capillary return  WNL.  Temperature gradient is  WNL.  Skin turgor  WNL  Sensorium  Senn Weinstein monofilament wire  WNL. Normal tactile sensation.  Nail Exam  Patient has normal nails with no evidence of bacterial or fungal infection.  Orthopedic  Exam  Muscle tone and muscle strength  WNL.  No limitations of motion feet  B/L.  No crepitus or joint effusion noted.  Foot type is unremarkable and digits show no abnormalities.  Bony prominences are unremarkable. Pes planus.  Palpable pain navicular left foot.  Skin  No open lesions.  Normal skin texture and turgor.  Tendinitis left foot  ROV.  Discussed this condition with this patient.  Patient was dispensed a compression anklet to be worn on her left ankle as well as a cam walker for her to use for the next 2 to 3 weeks.  We also will schedule her a follow-up orthoses appointment with pedorthist  in the near future.  Gardiner Barefoot DPM

## 2020-03-12 ENCOUNTER — Other Ambulatory Visit: Payer: Self-pay

## 2020-03-12 ENCOUNTER — Encounter (INDEPENDENT_AMBULATORY_CARE_PROVIDER_SITE_OTHER): Payer: Self-pay | Admitting: Vascular Surgery

## 2020-03-12 ENCOUNTER — Ambulatory Visit (INDEPENDENT_AMBULATORY_CARE_PROVIDER_SITE_OTHER): Payer: Managed Care, Other (non HMO) | Admitting: Vascular Surgery

## 2020-03-12 ENCOUNTER — Ambulatory Visit (INDEPENDENT_AMBULATORY_CARE_PROVIDER_SITE_OTHER): Payer: Managed Care, Other (non HMO)

## 2020-03-12 VITALS — BP 117/74 | HR 74 | Resp 14 | Ht 66.0 in | Wt 315.0 lb

## 2020-03-12 DIAGNOSIS — I10 Essential (primary) hypertension: Secondary | ICD-10-CM | POA: Diagnosis not present

## 2020-03-12 DIAGNOSIS — I872 Venous insufficiency (chronic) (peripheral): Secondary | ICD-10-CM | POA: Diagnosis not present

## 2020-03-12 DIAGNOSIS — I890 Lymphedema, not elsewhere classified: Secondary | ICD-10-CM | POA: Diagnosis not present

## 2020-03-12 DIAGNOSIS — K219 Gastro-esophageal reflux disease without esophagitis: Secondary | ICD-10-CM | POA: Diagnosis not present

## 2020-03-12 NOTE — Progress Notes (Signed)
MRN : [id number]  Evelyn Flores is a 34 y.o. (28-Feb-1986) female who presents with chief complaint of No chief complaint on file. Marland Kitchen  History of Present Illness:   The patient returns for followup evaluation of leg pain and swelling. The patient continues to have pain in the lower extremities with dependency. The pain is lessened with elevation. Graduated compression stockings, Class I (20-30 mmHg), have been worn but the stockings do not eliminate the leg pain. Over-the-counter analgesics do not improve the symptoms. The degree of discomfort continues to interfere with daily activities. The patient notes the pain in the legs is causing problems with daily exercise, at the workplace and even with household activities and maintenance such as standing in the kitchen preparing meals and doing dishes.   Venous ultrasound shows normal deep venous system, no evidence of acute or chronic DVT.  Superficial reflux is present in the right great saphenous with apparent recanalization of her prior ablation.  On the left the great saphenous remains ablated there is mild reflux at the junction itself.  No outpatient medications have been marked as taking for the 03/12/20 encounter (Appointment) with [name], Dolores Lory, MD.    Past Medical History:  Diagnosis Date  . Anxiety   . Depression   . DVT (deep venous thrombosis) ([place])   . Ear infection     Past Surgical History:  Procedure Laterality Date  . CHOLECYSTECTOMY    . VEIN SURGERY Bilateral    vein surgery both legs    Social History Social History   Tobacco Use  . Smoking status: Never Smoker  . Smokeless tobacco: Never Used  Substance Use Topics  . Alcohol use: No    Alcohol/week: 0.0 standard drinks  . Drug use: No    Family History Family History  Problem Relation Age of Onset  . Panic disorder Mother   . Dementia Paternal Grandfather     Allergies  Allergen Reactions  . Influenza Vaccines Other (See Comments)    Very  sick and was hospitalized   . Sulfa Antibiotics Swelling  . Topiramate Other (See Comments)    Mental fogginess, tingling, numbness     REVIEW OF SYSTEMS (Negative unless checked)  Constitutional: [] Weight loss  [] Fever  [] Chills Cardiac: [] Chest pain   [] Chest pressure   [] Palpitations   [] Shortness of breath when laying flat   [] Shortness of breath with exertion. Vascular:  [] Pain in legs with walking   [x] Pain in legs at rest  [] History of DVT   [] Phlebitis   [x] Swelling in legs   [] Varicose veins   [] Non-healing ulcers Pulmonary:   [] Uses home oxygen   [] Productive cough   [] Hemoptysis   [] Wheeze  [] COPD   [] Asthma Neurologic:  [] Dizziness   [] Seizures   [] History of stroke   [] History of TIA  [] Aphasia   [] Vissual changes   [] Weakness or numbness in arm   [] Weakness or numbness in leg Musculoskeletal:   [] Joint swelling   [x] Joint pain   [] Low back pain Hematologic:  [] Easy bruising  [] Easy bleeding   [] Hypercoagulable state   [] Anemic Gastrointestinal:  [] Diarrhea   [] Vomiting  [x] Gastroesophageal reflux/heartburn   [] Difficulty swallowing. Genitourinary:  [] Chronic kidney disease   [] Difficult urination  [] Frequent urination   [] Blood in urine Skin:  [] Rashes   [] Ulcers  Psychological:  [] History of anxiety   []  History of major depression.  Physical Examination  There were no vitals filed for this visit. There is no height or weight  on file to calculate BMI. Gen: WD/WN, NAD Head: [place]/AT, No temporalis wasting.  Ear/Nose/Throat: Hearing grossly intact, nares w/o erythema or drainage Eyes: PER, EOMI, sclera nonicteric.  Neck: Supple, no large masses.   Pulmonary:  Good air movement, no audible wheezing bilaterally, no use of accessory muscles.  Cardiac: RRR, no JVD Vascular: scattered varicosities present bilaterally.  Moderate venous stasis changes to the legs bilaterally.  4+ soft pitting edema. Vessel Right Left  Radial Palpable Palpable  Gastrointestinal: Non-distended. No  guarding/no peritoneal signs.  Musculoskeletal: M/S 5/5 throughout.  No deformity or atrophy.  Neurologic: CN 2-12 intact. Symmetrical.  Speech is fluent. Motor exam as listed above. Psychiatric: Judgment intact, Mood & affect appropriate for pt's clinical situation. Dermatologic: No rashes or ulcers noted.  No changes consistent with cellulitis.  CBC Lab Results  Component Value Date   WBC 9.1 01/26/2019   HGB 13.4 01/26/2019   HCT 39.0 01/26/2019   MCV 83.9 01/26/2019   PLT 228 01/26/2019    BMET    Component Value Date/Time   NA 136 01/26/2019 2159   NA 140 12/15/2014 2220   K 3.3 (L) 01/26/2019 2159   K 3.6 12/15/2014 2220   CL 106 01/26/2019 2159   CL 106 12/15/2014 2220   CO2 24 01/26/2019 2159   CO2 29 12/15/2014 2220   GLUCOSE 100 (H) 01/26/2019 2159   GLUCOSE 85 12/15/2014 2220   BUN 13 01/26/2019 2159   BUN 12 12/15/2014 2220   CREATININE 0.75 01/26/2019 2159   CREATININE 0.94 12/15/2014 2220   CALCIUM 9.2 01/26/2019 2159   CALCIUM 9.0 12/15/2014 2220   GFRNONAA >60 01/26/2019 2159   GFRNONAA >60 12/15/2014 2220   GFRAA >60 01/26/2019 2159   GFRAA >60 12/15/2014 2220   CrCl cannot be calculated (Patient's most recent lab result is older than the maximum 21 days allowed.).  COAG No results found for: INR, PROTIME  Radiology No results found.   Assessment/Plan 1. Lymphedema of both lower extremities Recommend:  No surgery or intervention at this point in time.    I have reviewed my previous discussion with the patient regarding swelling and why it causes symptoms.  Patient will continue wearing graduated compression stockings class 1 (20-30 mmHg) on a daily basis. The patient will  beginning wearing the stockings first thing in the morning and removing them in the evening. The patient is instructed specifically not to sleep in the stockings.    In addition, behavioral modification including several periods of elevation of the lower extremities during  the day will be continued.  This was reviewed with the patient during the initial visit.  The patient will also continue routine exercise, especially walking on a daily basis as was discussed during the initial visit.    Despite conservative treatments including graduated compression therapy class 1 and behavioral modification including exercise and elevation the patient  has not obtained adequate control of the lymphedema.  The patient still has stage 3 lymphedema and therefore, I believe that a lymph pump should be added to improve the control of the patient's lymphedema.  Additionally, a lymph pump is warranted because it will reduce the risk of cellulitis and ulceration in the future.  Patient should follow-up in six months    2. Venous insufficiency Recommend:  No surgery or intervention at this point in time.    I have reviewed my previous discussion with the patient regarding swelling and why it causes symptoms.  Patient will continue wearing graduated  compression stockings class 1 (20-30 mmHg) on a daily basis. The patient will  beginning wearing the stockings first thing in the morning and removing them in the evening. The patient is instructed specifically not to sleep in the stockings.    In addition, behavioral modification including several periods of elevation of the lower extremities during the day will be continued.  This was reviewed with the patient during the initial visit.  The patient will also continue routine exercise, especially walking on a daily basis as was discussed during the initial visit.    Despite conservative treatments including graduated compression therapy class 1 and behavioral modification including exercise and elevation the patient  has not obtained adequate control of the lymphedema.  The patient still has stage 3 lymphedema and therefore, I believe that a lymph pump should be added to improve the control of the patient's lymphedema.  Additionally, a  lymph pump is warranted because it will reduce the risk of cellulitis and ulceration in the future.  Patient should follow-up in six months    3. Essential hypertension Continue antihypertensive medications as already ordered, these medications have been reviewed and there are no changes at this time.   4. Gastroesophageal reflux disease without esophagitis Continue PPI as already ordered, this medication has been reviewed and there are no changes at this time.  Avoidence of caffeine and alcohol  Moderate elevation of the head of the bed   Hortencia Pilar, MD  03/12/2020 1:13 PM

## 2020-03-15 ENCOUNTER — Ambulatory Visit (INDEPENDENT_AMBULATORY_CARE_PROVIDER_SITE_OTHER): Payer: Managed Care, Other (non HMO) | Admitting: Podiatry

## 2020-03-15 ENCOUNTER — Encounter: Payer: Self-pay | Admitting: Podiatry

## 2020-03-15 ENCOUNTER — Other Ambulatory Visit: Payer: Self-pay

## 2020-03-15 VITALS — Temp 98.7°F

## 2020-03-15 DIAGNOSIS — M722 Plantar fascial fibromatosis: Secondary | ICD-10-CM | POA: Diagnosis not present

## 2020-03-15 DIAGNOSIS — M2141 Flat foot [pes planus] (acquired), right foot: Secondary | ICD-10-CM

## 2020-03-15 DIAGNOSIS — M2142 Flat foot [pes planus] (acquired), left foot: Secondary | ICD-10-CM

## 2020-03-15 DIAGNOSIS — M76822 Posterior tibial tendinitis, left leg: Secondary | ICD-10-CM | POA: Diagnosis not present

## 2020-03-15 NOTE — Progress Notes (Signed)
This patient returns to the office for continued evaluation of a painful left foot.  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  She was treated with a cam walker with an anklet.  She presents the office today stating that her foot is better but now she has pain present at the bottom of her right heel.  She says she has attempted her shoes but she still is unable to wear them pain-free.  She presents the office today for continued evaluation and treatment of her right foot  Vascular  Dorsalis pedis and posterior tibial pulses are palpable  B/L.  Capillary return  WNL.  Temperature gradient is  WNL.  Skin turgor  WNL  Sensorium  Senn Weinstein monofilament wire  WNL. Normal tactile sensation.  Nail Exam  Patient has normal nails with no evidence of bacterial or fungal infection.  Orthopedic  Exam  Muscle tone and muscle strength  WNL.  No limitations of motion feet  B/L.  No crepitus or joint effusion noted.  Foot type is unremarkable and digits show no abnormalities.  Bony prominences are unremarkable. Pes planus.  Palpable pain navicular left foot resolved.  Skin  No open lesions.  Normal skin texture and turgor.  Tendinitis left foot  ROV.  Discussed this condition with this patient.  Told this patient to continue wearing the anklet and  the cam walker.  She has also made an appointment for new orthoses.  She was told to continue to walk with the cam walker and use the anklet until her pain subsides.  She is also to use her new orthoses when they arrive.  Patient to return to the office as needed.  Told her her heel pain is due to the fact that she is not wearing any support in her cam walker.  Gardiner Barefoot DPM

## 2020-03-21 ENCOUNTER — Other Ambulatory Visit: Payer: Self-pay

## 2020-03-21 ENCOUNTER — Ambulatory Visit (INDEPENDENT_AMBULATORY_CARE_PROVIDER_SITE_OTHER): Payer: Managed Care, Other (non HMO) | Admitting: Orthotics

## 2020-03-21 DIAGNOSIS — M216X2 Other acquired deformities of left foot: Secondary | ICD-10-CM

## 2020-03-21 DIAGNOSIS — M76822 Posterior tibial tendinitis, left leg: Secondary | ICD-10-CM

## 2020-03-21 DIAGNOSIS — M216X1 Other acquired deformities of right foot: Secondary | ICD-10-CM | POA: Diagnosis not present

## 2020-03-21 DIAGNOSIS — M2141 Flat foot [pes planus] (acquired), right foot: Secondary | ICD-10-CM

## 2020-03-21 NOTE — Progress Notes (Signed)
Patient getting new f/o to address PTTD/pes planus.Marland KitchenMarland KitchenMarland Kitchen

## 2020-03-26 ENCOUNTER — Encounter: Payer: Self-pay | Admitting: Psychiatry

## 2020-03-26 ENCOUNTER — Other Ambulatory Visit: Payer: Self-pay

## 2020-03-26 ENCOUNTER — Ambulatory Visit (INDEPENDENT_AMBULATORY_CARE_PROVIDER_SITE_OTHER): Payer: 59 | Admitting: Psychiatry

## 2020-03-26 DIAGNOSIS — F411 Generalized anxiety disorder: Secondary | ICD-10-CM | POA: Diagnosis not present

## 2020-03-26 DIAGNOSIS — G4700 Insomnia, unspecified: Secondary | ICD-10-CM | POA: Diagnosis not present

## 2020-03-26 DIAGNOSIS — F3342 Major depressive disorder, recurrent, in full remission: Secondary | ICD-10-CM | POA: Diagnosis not present

## 2020-03-26 MED ORDER — ESZOPICLONE 2 MG PO TABS: 2.0000 mg | ORAL_TABLET | Freq: Every evening | ORAL | 1 refills | Status: DC | PRN

## 2020-03-26 MED ORDER — SERTRALINE HCL 100 MG PO TABS: 150.0000 mg | ORAL_TABLET | Freq: Every day | ORAL | 0 refills | Status: DC

## 2020-03-26 NOTE — Progress Notes (Signed)
Provider Location : [facility] Patient Location : Home  Virtual Visit via Video Note  I connected with Evelyn Flores on 03/26/20 at 10:00 AM EDT by a video enabled telemedicine application and verified that I am speaking with the correct person using two identifiers.   I discussed the limitations of evaluation and management by telemedicine and the availability of in person appointments. The patient expressed understanding and agreed to proceed.   I discussed the assessment and treatment plan with the patient. The patient was provided an opportunity to ask questions and all were answered. The patient agreed with the plan and demonstrated an understanding of the instructions.   The patient was advised to call back or seek an in-person evaluation if the symptoms worsen or if the condition fails to improve as anticipated.  [place] MD OP Progress Note  03/26/2020 5:30 PM Evelyn Flores  MRN:  [id number]  Chief Complaint:  Chief Complaint    Follow-up     HPI: Evelyn Flores is a 34 year old Caucasian female, single, lives at [facility] with parents, employed, has a history of MDD, GAD was evaluated by telemedicine today.  Patient today reports she is currently making progress with regards to her mood.  She is compliant on medications as prescribed.  She denies side effects.  She reports she continues to struggle with sleep issues.  She reports she is able to sleep through the night however wakes up feeling unrested.  She was referred for sleep study again by neurology still pending.  She continues to be compliant on Lunesta which helps.  She denies side effects.  She continues to struggle with left foot pain due to plantar fasciitis.  She is currently following up with her providers.  Patient denies any suicidality, homicidality or perceptual disturbances.  Patient denies any other concerns today.   Visit Diagnosis:    ICD-10-CM   1. MDD (major depressive disorder), recurrent, in full remission ([place])   F33.42   2. GAD (generalized anxiety disorder)  F41.1 sertraline (ZOLOFT) 100 MG tablet  3. Insomnia, unspecified type  G47.00 eszopiclone (LUNESTA) 2 MG TABS tablet    Past Psychiatric History: I have reviewed past psychiatric history from my progress note on 03/18/2018  Past Medical History:  Past Medical History:  Diagnosis Date  . Anxiety   . Depression   . DVT (deep venous thrombosis) ([place])   . Ear infection     Past Surgical History:  Procedure Laterality Date  . CHOLECYSTECTOMY    . VEIN SURGERY Bilateral    vein surgery both legs    Family Psychiatric History: I have reviewed family psychiatric history from my progress note on 03/18/2018  Family History:  Family History  Problem Relation Age of Onset  . Panic disorder Mother   . Dementia Paternal Grandfather     Social History: Reviewed social history from my progress note on 03/18/2018 Social History   Socioeconomic History  . Marital status: Single    Spouse name: Not on file  . Number of children: 0  . Years of education: Not on file  . Highest education level: High school graduate  Occupational History  . Occupation: sport endeavors    Comment: fulltime  Tobacco Use  . Smoking status: Never Smoker  . Smokeless tobacco: Never Used  Substance and Sexual Activity  . Alcohol use: No    Alcohol/week: 0.0 standard drinks  . Drug use: No  . Sexual activity: Yes    Birth control/protection: Injection, Condom  Other Topics Concern  . Not on file  Social History Narrative  . Not on file   Social Determinants of Health   Financial Resource Strain:   . Difficulty of Paying Living Expenses:   Food Insecurity:   . Worried About Charity fundraiser in the Last Year:   . Arboriculturist in the Last Year:   Transportation Needs:   . Film/video editor (Medical):   Marland Kitchen Lack of Transportation (Non-Medical):   Physical Activity:   . Days of Exercise per Week:   . Minutes of Exercise per Session:   Stress:    . Feeling of Stress :   Social Connections:   . Frequency of Communication with Friends and Family:   . Frequency of Social Gatherings with Friends and Family:   . Attends Religious Services:   . Active Member of Clubs or Organizations:   . Attends Archivist Meetings:   Marland Kitchen Marital Status:     Allergies:  Allergies  Allergen Reactions  . Influenza Vaccines Other (See Comments)    Very sick and was hospitalized   . Sulfa Antibiotics Swelling  . Topiramate Other (See Comments)    Mental fogginess, tingling, numbness    Metabolic Disorder Labs: No results found for: HGBA1C, MPG No results found for: PROLACTIN No results found for: CHOL, TRIG, HDL, CHOLHDL, VLDL, LDLCALC No results found for: TSH  Therapeutic Level Labs: No results found for: LITHIUM No results found for: VALPROATE No components found for:  CBMZ  Current Medications: Current Outpatient Medications  Medication Sig Dispense Refill  . acetaminophen (TYLENOL) 325 MG tablet Take by mouth.    Marland Kitchen aspirin-acetaminophen-caffeine (EXCEDRIN MIGRAINE) 250-250-65 MG tablet Take by mouth.    . baclofen (LIORESAL) 10 MG tablet TAKE 1 TABLET BY MOUTH 2 TIMES DAILY AS NEEDED  3  . [START ON 04/03/2020] eszopiclone (LUNESTA) 2 MG TABS tablet Take 1 tablet (2 mg total) by mouth at bedtime as needed for sleep. Take immediately before bedtime 30 tablet 1  . fluconazole (DIFLUCAN) 150 MG tablet Take 150 mg by mouth every 3 (three) days.    . fluticasone (FLONASE) 50 MCG/ACT nasal spray Place 2 sprays into both nostrils daily.  12  . gabapentin (NEURONTIN) 300 MG capsule   1  . hydrOXYzine (ATARAX/VISTARIL) 25 MG tablet Take 0.5-1 tablets (12.5-25 mg total) by mouth 2 (two) times daily as needed. For severe anxiety attacks only 60 tablet 1  . ibuprofen (ADVIL,MOTRIN) 600 MG tablet TAKE 1 TABLET BY MOUTH THREE TIMES DAILY WITH A MEAL FOR 7 DAYS  0  . magnesium oxide (MAG-OX) 400 MG tablet Take by mouth.    .  medroxyPROGESTERone (DEPO-PROVERA) 150 MG/ML injection Inject 150 mg into the muscle every 3 (three) months. Last injection June 2015    . medroxyPROGESTERone Acetate 150 MG/ML SUSY INJECT 1 ML INTO THE MUSCLE EVERY 3 MONTHS    . meloxicam (MOBIC) 15 MG tablet TAKE 1 TABLET BY MOUTH EVERY DAY 30 tablet 3  . methylPREDNISolone (MEDROL DOSEPAK) 4 MG TBPK tablet Use as directed 30 tablet 1  . omeprazole (PRILOSEC) 40 MG capsule Take 40 mg by mouth daily.    . pregabalin (LYRICA) 75 MG capsule Take by mouth.    . pregabalin (LYRICA) 75 MG capsule Take 1 capsule by mouth twice daily    . rizatriptan (MAXALT-MLT) 10 MG disintegrating tablet     . sertraline (ZOLOFT) 100 MG tablet Take 1.5 tablets (150 mg total)  by mouth daily. 135 tablet 0  . SUMAtriptan (IMITREX) 20 MG/ACT nasal spray 1 SPRAY INTO LEFT NOSTRIL ONCE AS NEEDED FOR MIGRAINE. MAY REPEAT AFTER 2 HOURS IF NEEDED.  6  . triamterene-hydrochlorothiazide (MAXZIDE-25) 37.5-25 MG tablet Take 0.5 tablets by mouth daily.  6   No current facility-administered medications for this visit.     Musculoskeletal: Strength & Muscle Tone: UTA Gait & Station: Observed as seated Patient leans: N/A  Psychiatric Specialty Exam: Review of Systems  Musculoskeletal:       Left foot pain  Psychiatric/Behavioral: The patient is nervous/anxious.   All other systems reviewed and are negative.   There were no vitals taken for this visit.There is no height or weight on file to calculate BMI.  General Appearance: Casual  Eye Contact:  Fair  Speech:  Clear and Coherent  Volume:  Normal  Mood:  Anxious situational - due to foot pain  Affect:  Congruent  Thought Process:  Goal Directed and Descriptions of Associations: Intact  Orientation:  Full (Time, Place, and Person)  Thought Content: Logical   Suicidal Thoughts:  No  Homicidal Thoughts:  No  Memory:  Immediate;   Fair Recent;   Fair Remote;   Fair  Judgement:  Fair  Insight:  Fair  Psychomotor  Activity:  Normal  Concentration:  Concentration: Fair and Attention Span: Fair  Recall:  [employer] of Knowledge: Fair  Language: Fair  Akathisia:  No  Handed:  Right  AIMS (if indicated): UTA  Assets:  Communication Skills Desire for Improvement Housing Social Support  ADL's:  Intact  Cognition: WNL  Sleep:  Fair   Screenings: PHQ2-9     Nutrition from 05/07/2017 in [facility]  PHQ-2 Total Score  0       Assessment and Plan: Evelyn Flores is a 34 year old Caucasian female, single, employed, lives in [place], has a history of MDD, GAD was evaluated by telemedicine today.  Patient with psychosocial stressors of her health issues, current pandemic.  Patient continues to struggle with foot pain which does affect her daily functioning.  Discussed plan as noted below.  Plan MDD in remission Zoloft as prescribed  GAD-stable Zoloft 150 mg p.o. daily Hydroxyzine 12.5 to 25 mg p.o. twice daily as needed for anxiety attacks  Insomnia-unspecified-patient with remote history of OSA Lunesta 2 mg p.o. nightly Patient was referred for sleep study per neurology-pending.  Patient reports she has reached out to Ms. [name] and has upcoming appointment scheduled for therapy.  Encouraged her to do so.  Follow-up in clinic in 1 month or sooner if needed.  I have spent atleast 20 minutes non face to face with patient today. More than 50 % of the time was spent for preparing to see the patient ( e.g., review of test, records ),ordering medications and test ,psychoeducation and supportive psychotherapy and care coordination,as well as documenting clinical information in electronic health record. This note was generated in part or whole with voice recognition software. Voice recognition is usually quite accurate but there are transcription errors that can and very often do occur. I apologize for any typographical errors that were not detected and  corrected.       Ursula Alert, MD 03/26/2020, 5:30 PM

## 2020-04-05 ENCOUNTER — Other Ambulatory Visit: Payer: Self-pay

## 2020-04-05 ENCOUNTER — Encounter: Payer: Self-pay | Admitting: *Deleted

## 2020-04-05 ENCOUNTER — Encounter: Payer: Self-pay | Admitting: Podiatry

## 2020-04-05 ENCOUNTER — Ambulatory Visit (INDEPENDENT_AMBULATORY_CARE_PROVIDER_SITE_OTHER): Payer: Managed Care, Other (non HMO) | Admitting: Podiatry

## 2020-04-05 VITALS — Temp 98.6°F

## 2020-04-05 DIAGNOSIS — M76822 Posterior tibial tendinitis, left leg: Secondary | ICD-10-CM | POA: Diagnosis not present

## 2020-04-05 DIAGNOSIS — M2141 Flat foot [pes planus] (acquired), right foot: Secondary | ICD-10-CM | POA: Diagnosis not present

## 2020-04-05 DIAGNOSIS — M2142 Flat foot [pes planus] (acquired), left foot: Secondary | ICD-10-CM | POA: Diagnosis not present

## 2020-04-05 DIAGNOSIS — M722 Plantar fascial fibromatosis: Secondary | ICD-10-CM | POA: Diagnosis not present

## 2020-04-05 MED ORDER — METHYLPREDNISOLONE 4 MG PO TBPK: ORAL_TABLET | ORAL | 1 refills | Status: DC

## 2020-04-05 NOTE — Progress Notes (Addendum)
This patient returns to the office stating she is developed extreme pain over the last 4 to 5 days in her left foot.  Patient states she was doing well until she started wearing her old orthoses and the pain returned.  She says she is now experiencing pain through the arch of her left foot and pain extending on the inside of her left ankle.She says her pain is 8 out of 10 when weight bearing.    She previously had been treated by myself for it with an anklet and a cam walker and she had improved.  She had adjustments made to her old orthoses and was casted for new orthoses by [employer].  She is scheduled to pick up her new orthoses soon.  She presents the office today for continued evaluation and treatment of her painful left foot.  Vascular  Dorsalis pedis and posterior tibial pulses are palpable  B/L.  Capillary return  WNL.  Temperature gradient is  WNL.  Skin turgor  WNL  Sensorium  Senn Weinstein monofilament wire  WNL. Normal tactile sensation. Muscle testing reveals 1/4 PTT and 4/4 peroneal tendon left foot.  Nail Exam  Patient has normal nails with no evidence of bacterial or fungal infection.  Orthopedic  Exam  Muscle tone and muscle strength  WNL.  No limitations of motion feet  B/L.  No crepitus or joint effusion noted.  Foot type is unremarkable and digits show no abnormalities.  Bony prominences are unremarkable. Pes planus  B/L.  Lymphedema  Legs  B/L.  Patient has pain extending from her foot up into her PTT left foot.  Plantar fasciitis  B/L.   Skin  No open lesions.  Normal skin texture and turgor.  Tendinitis left foot  Pes planus  B/L  ROV.  Discussed this condition with this patient.  We chose to apply an Unna boot for additional immobilization of her left ankle and foot.  She was also given off from work Thursday and Friday this week.  Prescribed a Medrol Dosepak to be taken as directed.  She is to ambulate with her cam walker and return to the office in 1 week.  Patient is scheduled  to pick up her orthoses next Wednesday.     Gardiner Barefoot DPM

## 2020-04-11 ENCOUNTER — Other Ambulatory Visit: Payer: Self-pay

## 2020-04-11 ENCOUNTER — Ambulatory Visit: Payer: Managed Care, Other (non HMO) | Admitting: Orthotics

## 2020-04-11 DIAGNOSIS — M2141 Flat foot [pes planus] (acquired), right foot: Secondary | ICD-10-CM

## 2020-04-11 DIAGNOSIS — M2142 Flat foot [pes planus] (acquired), left foot: Secondary | ICD-10-CM

## 2020-04-11 DIAGNOSIS — M76822 Posterior tibial tendinitis, left leg: Secondary | ICD-10-CM

## 2020-04-11 NOTE — Progress Notes (Signed)
Patient came in today to p/up F/O however was wearing boot, ace bandage and was not able to try on shoe w/ f/o.   Has appointment w Dr Prudence Davidson this week and will try on after he gets her out of boot.

## 2020-04-12 ENCOUNTER — Encounter: Payer: Self-pay | Admitting: Podiatry

## 2020-04-12 ENCOUNTER — Ambulatory Visit (INDEPENDENT_AMBULATORY_CARE_PROVIDER_SITE_OTHER): Payer: Managed Care, Other (non HMO) | Admitting: Podiatry

## 2020-04-12 ENCOUNTER — Other Ambulatory Visit: Payer: Self-pay

## 2020-04-12 VITALS — Temp 98.8°F

## 2020-04-12 DIAGNOSIS — M2142 Flat foot [pes planus] (acquired), left foot: Secondary | ICD-10-CM

## 2020-04-12 DIAGNOSIS — M2141 Flat foot [pes planus] (acquired), right foot: Secondary | ICD-10-CM

## 2020-04-12 DIAGNOSIS — M722 Plantar fascial fibromatosis: Secondary | ICD-10-CM

## 2020-04-12 DIAGNOSIS — M76822 Posterior tibial tendinitis, left leg: Secondary | ICD-10-CM

## 2020-04-12 NOTE — Progress Notes (Signed)
This patient returns to the office wearing her unna boot which was applied last week.  Patient has said that her foot is improved wearing the unna boot and cam walker togather.  Patient was seen by Liliane Channel and picked up her new orthoses yesterday.  She presents for evaluation and treatment.  General Appearance  Alert, conversant and in no acute stress.  Vascular  Dorsalis pedis and posterior tibial  pulses are palpable  bilaterally.  Capillary return is within normal limits  bilaterally. Temperature is within normal limits  bilaterally.  Neurologic  Senn-Weinstein monofilament wire test within normal limits  bilaterally. Muscle power within normal limits bilaterally.  Nails Thick disfigured discolored nails with subungual debris  from hallux to fifth toes bilaterally. No evidence of bacterial infection or drainage bilaterally.  Orthopedic  No limitations of motion  feet .  No crepitus or effusions noted.  No bony pathology or digital deformities noted.  No swelling or pain at the insertion of PTT left foot.  Skin  normotropic skin with no porokeratosis noted bilaterally.  No signs of infections or ulcers noted.      PTTD left foot  ROV.  Unna boot removed and foot examined.  Marked improvement.  Patient was told to start wearing her new orthoses.  I told her I was always attempting to control her pain until the new orthoses were dispensed.   Wear the orthoses to tolerance and use the cam walker as needed.  RTC prn.   Gardiner Barefoot DPM

## 2020-04-30 ENCOUNTER — Other Ambulatory Visit: Payer: Self-pay

## 2020-04-30 ENCOUNTER — Encounter: Payer: Self-pay | Admitting: Psychiatry

## 2020-04-30 ENCOUNTER — Telehealth (INDEPENDENT_AMBULATORY_CARE_PROVIDER_SITE_OTHER): Payer: 59 | Admitting: Psychiatry

## 2020-04-30 DIAGNOSIS — F411 Generalized anxiety disorder: Secondary | ICD-10-CM | POA: Diagnosis not present

## 2020-04-30 DIAGNOSIS — G4700 Insomnia, unspecified: Secondary | ICD-10-CM

## 2020-04-30 DIAGNOSIS — F3342 Major depressive disorder, recurrent, in full remission: Secondary | ICD-10-CM

## 2020-04-30 MED ORDER — BUSPIRONE HCL 10 MG PO TABS: 10.0000 mg | ORAL_TABLET | Freq: Two times a day (BID) | ORAL | 1 refills | Status: DC

## 2020-04-30 MED ORDER — ESZOPICLONE 2 MG PO TABS: 2.0000 mg | ORAL_TABLET | Freq: Every evening | ORAL | 1 refills | Status: DC | PRN

## 2020-04-30 NOTE — Progress Notes (Signed)
Provider Location : [facility] Patient Location : Home  Virtual Visit via Video Note  I connected with Evelyn Flores on 04/30/20 at 10:40 AM EDT by a video enabled telemedicine application and verified that I am speaking with the correct person using two identifiers.   I discussed the limitations of evaluation and management by telemedicine and the availability of in person appointments. The patient expressed understanding and agreed to proceed.    I discussed the assessment and treatment plan with the patient. The patient was provided an opportunity to ask questions and all were answered. The patient agreed with the plan and demonstrated an understanding of the instructions.   The patient was advised to call back or seek an in-person evaluation if the symptoms worsen or if the condition fails to improve as anticipated.  [place] MD OP Progress Note  04/30/2020 12:39 PM MAI KOTTLER  MRN:  [id number]  Chief Complaint:  Chief Complaint    Follow-up     HPI: Evelyn Flores is a 33 year old Caucasian female, single, lives at [place] with parents, employed, has a history of MDD, GAD was evaluated by telemedicine today.  Patient today reports she continues to struggle with left lower extremity pain.  She reports she had appointment with podiatry recently.  She reports she was advised to start using orthotics.  She reports however that does not help much.  She currently rates her pain at an 8 out of 10, 10 being the worst.  She reports this does have an impact on her mood.  She is trying to cope as best as she can.  She however does struggle with a lot of anxiety in general.  She worries about her health.  She worries about her job.  She reports her therapist recently felt that her anxiety could be getting worse.  Patient completed a GAD-7 in session today.  She reports sleep is okay.  She takes the [place] as needed.  She however reports she has upcoming appointment for a sleep study coming up.  Patient  denies any suicidality, homicidality or perceptual disturbances.  Patient denies any other concerns today. Visit Diagnosis:    ICD-10-CM   1. MDD (major depressive disorder), recurrent, in full remission ([place])  F33.42   2. GAD (generalized anxiety disorder)  F41.1 busPIRone (BUSPAR) 10 MG tablet  3. Insomnia, unspecified type  G47.00 eszopiclone (LUNESTA) 2 MG TABS tablet    Past Psychiatric History: I have reviewed past psychiatric history from my progress note on 03/18/2018  Past Medical History:  Past Medical History:  Diagnosis Date  . Anxiety   . Depression   . DVT (deep venous thrombosis) ([place])   . Ear infection     Past Surgical History:  Procedure Laterality Date  . CHOLECYSTECTOMY    . VEIN SURGERY Bilateral    vein surgery both legs    Family Psychiatric History: I have reviewed family psychiatric history from my progress note on 03/18/2018  Family History:  Family History  Problem Relation Age of Onset  . Panic disorder Mother   . Dementia Paternal Grandfather     Social History: I have reviewed social history from my progress note on 03/18/2018 Social History   Socioeconomic History  . Marital status: Single    Spouse name: Not on file  . Number of children: 0  . Years of education: Not on file  . Highest education level: High school graduate  Occupational History  . Occupation: sport endeavors    Comment:  fulltime  Tobacco Use  . Smoking status: Never Smoker  . Smokeless tobacco: Never Used  Substance and Sexual Activity  . Alcohol use: No    Alcohol/week: 0.0 standard drinks  . Drug use: No  . Sexual activity: Yes    Birth control/protection: Injection, Condom  Other Topics Concern  . Not on file  Social History Narrative  . Not on file   Social Determinants of Health   Financial Resource Strain:   . Difficulty of Paying Living Expenses:   Food Insecurity:   . Worried About Charity fundraiser in the Last Year:   . Arboriculturist in the  Last Year:   Transportation Needs:   . Film/video editor (Medical):   Marland Kitchen Lack of Transportation (Non-Medical):   Physical Activity:   . Days of Exercise per Week:   . Minutes of Exercise per Session:   Stress:   . Feeling of Stress :   Social Connections:   . Frequency of Communication with Friends and Family:   . Frequency of Social Gatherings with Friends and Family:   . Attends Religious Services:   . Active Member of Clubs or Organizations:   . Attends Archivist Meetings:   Marland Kitchen Marital Status:     Allergies:  Allergies  Allergen Reactions  . Influenza Vaccines Other (See Comments)    Very sick and was hospitalized   . Sulfa Antibiotics Swelling  . Topiramate Other (See Comments)    Mental fogginess, tingling, numbness    Metabolic Disorder Labs: No results found for: HGBA1C, MPG No results found for: PROLACTIN No results found for: CHOL, TRIG, HDL, CHOLHDL, VLDL, LDLCALC No results found for: TSH  Therapeutic Level Labs: No results found for: LITHIUM No results found for: VALPROATE No components found for:  CBMZ  Current Medications: Current Outpatient Medications  Medication Sig Dispense Refill  . acetaminophen (TYLENOL) 325 MG tablet Take by mouth.    Marland Kitchen aspirin-acetaminophen-caffeine (EXCEDRIN MIGRAINE) 250-250-65 MG tablet Take by mouth.    . baclofen (LIORESAL) 10 MG tablet TAKE 1 TABLET BY MOUTH 2 TIMES DAILY AS NEEDED  3  . eszopiclone (LUNESTA) 2 MG TABS tablet Take 1 tablet (2 mg total) by mouth at bedtime as needed. 30 tablet 1  . fluconazole (DIFLUCAN) 150 MG tablet Take 150 mg by mouth every 3 (three) days.    . fluticasone (FLONASE) 50 MCG/ACT nasal spray Place 2 sprays into both nostrils daily.  12  . hydrOXYzine (ATARAX/VISTARIL) 25 MG tablet Take 0.5-1 tablets (12.5-25 mg total) by mouth 2 (two) times daily as needed. For severe anxiety attacks only 60 tablet 1  . ibuprofen (ADVIL,MOTRIN) 600 MG tablet TAKE 1 TABLET BY MOUTH THREE TIMES  DAILY WITH A MEAL FOR 7 DAYS  0  . magnesium oxide (MAG-OX) 400 MG tablet Take by mouth.    . medroxyPROGESTERone (DEPO-PROVERA) 150 MG/ML injection Inject 150 mg into the muscle every 3 (three) months. Last injection June 2015    . medroxyPROGESTERone Acetate 150 MG/ML SUSY INJECT 1 ML INTO THE MUSCLE EVERY 3 MONTHS    . omeprazole (PRILOSEC) 40 MG capsule Take 40 mg by mouth daily.    . pregabalin (LYRICA) 75 MG capsule Take by mouth.    . pregabalin (LYRICA) 75 MG capsule Take 1 capsule by mouth twice daily    . rizatriptan (MAXALT-MLT) 10 MG disintegrating tablet     . sertraline (ZOLOFT) 100 MG tablet Take 1.5 tablets (150 mg total)  by mouth daily. 135 tablet 0  . SUMAtriptan (IMITREX) 20 MG/ACT nasal spray 1 SPRAY INTO LEFT NOSTRIL ONCE AS NEEDED FOR MIGRAINE. MAY REPEAT AFTER 2 HOURS IF NEEDED.  6  . triamterene-hydrochlorothiazide (MAXZIDE-25) 37.5-25 MG tablet Take 0.5 tablets by mouth daily.  6  . busPIRone (BUSPAR) 10 MG tablet Take 1 tablet (10 mg total) by mouth 2 (two) times daily. 60 tablet 1  . cephALEXin (KEFLEX) 500 MG capsule cephalexin 500 mg capsule  TAKE 1 CAPSULE BY MOUTH 4 TIMES A DAY    . gabapentin (NEURONTIN) 300 MG capsule   1  . meloxicam (MOBIC) 15 MG tablet TAKE 1 TABLET BY MOUTH EVERY DAY (Patient not taking: Reported on 04/30/2020) 30 tablet 3  . methylPREDNISolone (MEDROL DOSEPAK) 4 MG TBPK tablet Use as directed (Patient not taking: Reported on 04/30/2020) 30 tablet 1  . promethazine (PHENERGAN) 25 MG tablet promethazine 25 mg tablet  TAKE 1 TABLET BY MOUTH EVERY 6 HOURS AS NEEDED FOR NAUSEA     No current facility-administered medications for this visit.     Musculoskeletal: Strength & Muscle Tone: UTA Gait & Station: unsteady Patient leans: N/A  Psychiatric Specialty Exam: Review of Systems  Musculoskeletal:       Left LE pain  Psychiatric/Behavioral: Positive for sleep disturbance. The patient is nervous/anxious.   All other systems reviewed and  are negative.   There were no vitals taken for this visit.There is no height or weight on file to calculate BMI.  General Appearance: Casual  Eye Contact:  Fair  Speech:  Clear and Coherent  Volume:  Normal  Mood:  Anxious  Affect:  Congruent  Thought Process:  Goal Directed and Descriptions of Associations: Intact  Orientation:  Full (Time, Place, and Person)  Thought Content: Logical   Suicidal Thoughts:  No  Homicidal Thoughts:  No  Memory:  Immediate;   Fair Recent;   Fair Remote;   Fair  Judgement:  Fair  Insight:  Fair  Psychomotor Activity:  Normal  Concentration:  Concentration: Fair and Attention Span: Fair  Recall:  [employer] of Knowledge: Fair  Language: Fair  Akathisia:  No  Handed:  Right  AIMS (if indicated);UTA  Assets:  Communication Skills Desire for Improvement Housing Social Support  ADL's:  Intact  Cognition: WNL  Sleep:  Restless   Screenings: GAD-7     Video Visit from 04/30/2020 in [place]  Total GAD-7 Score  11    PHQ2-9     Nutrition from 05/07/2017 in [place]  PHQ-2 Total Score  0       Assessment and Plan: Lannah is a 34 year old Caucasian female, single, employed, lives in [place], has a history of MDD, GAD was evaluated by telemedicine today.  Patient with psychosocial stressors of her health issues, current pandemic.  Patient is currently struggling with anxiety symptoms more so because of her pain.  She also has sleep issues however does have sleep study coming up.  Plan as noted below.  Plan MDD in remission Zoloft as prescribed  GAD-unstable GAD 7 equals 11 Zoloft 150 mg p.o. daily Add BuSpar 10 mg p.o. twice daily Continue CBT with Ms. [name] Hydroxyzine 12.5 to 25 mg p.o. twice daily as needed for anxiety attacks  Insomnia-unspecified-patient with remote history of OSA Lunesta 2 mg p.o. nightly She does have upcoming home sleep study.  Follow-up in clinic  in 3 to 4 weeks or sooner if needed.  I have spent atleast 20 minutes non face to face with patient today. More than 50 % of the time was spent for preparing to see the patient ( e.g., review of test, records ), obtaining and to review and separately obtained history , ordering medications and test ,psychoeducation and supportive psychotherapy and care coordination,as well as documenting clinical information in electronic health record. This note was generated in part or whole with voice recognition software. Voice recognition is usually quite accurate but there are transcription errors that can and very often do occur. I apologize for any typographical errors that were not detected and corrected.       Ursula Alert, MD 04/30/2020, 12:39 PM

## 2020-05-24 ENCOUNTER — Other Ambulatory Visit: Payer: Self-pay

## 2020-05-24 ENCOUNTER — Encounter: Payer: Self-pay | Admitting: Psychiatry

## 2020-05-24 ENCOUNTER — Telehealth (INDEPENDENT_AMBULATORY_CARE_PROVIDER_SITE_OTHER): Payer: 59 | Admitting: Psychiatry

## 2020-05-24 DIAGNOSIS — F411 Generalized anxiety disorder: Secondary | ICD-10-CM | POA: Diagnosis not present

## 2020-05-24 DIAGNOSIS — G4700 Insomnia, unspecified: Secondary | ICD-10-CM

## 2020-05-24 DIAGNOSIS — F3342 Major depressive disorder, recurrent, in full remission: Secondary | ICD-10-CM

## 2020-05-24 MED ORDER — BUSPIRONE HCL 15 MG PO TABS: 15.0000 mg | ORAL_TABLET | Freq: Two times a day (BID) | ORAL | 2 refills | Status: DC

## 2020-05-24 MED ORDER — SERTRALINE HCL 100 MG PO TABS: 150.0000 mg | ORAL_TABLET | Freq: Every day | ORAL | 0 refills | Status: DC

## 2020-05-24 NOTE — Progress Notes (Signed)
Provider Location : [facility] Patient Location : Work  Virtual Visit via Video Note  I connected with Evelyn Flores on 05/24/20 at  1:45 PM EDT by a video enabled telemedicine application and verified that I am speaking with the correct person using two identifiers.   I discussed the limitations of evaluation and management by telemedicine and the availability of in person appointments. The patient expressed understanding and agreed to proceed.    I discussed the assessment and treatment plan with the patient. The patient was provided an opportunity to ask questions and all were answered. The patient agreed with the plan and demonstrated an understanding of the instructions.   The patient was advised to call back or seek an in-person evaluation if the symptoms worsen or if the condition fails to improve as anticipated.   [place] [facility] MD OP Progress Note  05/24/2020 5:07 PM Evelyn Flores  MRN:  [id number]  Chief Complaint:  Chief Complaint    Follow-up     HPI: Evelyn Flores is a 34 year old Caucasian female, single, lives at [place] with parents, employed, has a history of MDD, GAD was evaluated by telemedicine today.  Patient today reports she continues to struggle with anxiety.  The BuSpar is helpful.  She however reports she still has breakthrough anxiety when she feels overwhelmed.  She is interested in dosage increase.  She denies any side effects to the BuSpar.  She is compliant on all of her medications.  Denies side effects.  She reports sleep continues to be restless.  She had a home sleep study done and is awaiting the results.  She reports she continues to struggle with lower extremity pain.  She reports she was seen by a new provider and she currently wears a cast.  That does help her.  Patient denies any suicidality, homicidality or perceptual disturbances.  Patient continues to follow-up with Ms. [name].  She has upcoming appointment in a few days.  Patient denies  any other concerns today.    Visit Diagnosis:    ICD-10-CM   1. MDD (major depressive disorder), recurrent, in full remission ([place])  F33.42   2. GAD (generalized anxiety disorder)  F41.1 busPIRone (BUSPAR) 15 MG tablet    sertraline (ZOLOFT) 100 MG tablet  3. Insomnia, unspecified type  G47.00     Past Psychiatric History: I have reviewed past psychiatric history from my progress note on 03/18/2018  Past Medical History:  Past Medical History:  Diagnosis Date  . Anxiety   . Depression   . DVT (deep venous thrombosis) ([place])   . Ear infection     Past Surgical History:  Procedure Laterality Date  . CHOLECYSTECTOMY    . VEIN SURGERY Bilateral    vein surgery both legs    Family Psychiatric History: Reviewed family psychiatric history from my progress note on 03/18/2018  Family History:  Family History  Problem Relation Age of Onset  . Panic disorder Mother   . Dementia Paternal Grandfather     Social History: Reviewed social history from my progress note on 03/18/2018 Social History   Socioeconomic History  . Marital status: Single    Spouse name: Not on file  . Number of children: 0  . Years of education: Not on file  . Highest education level: High school graduate  Occupational History  . Occupation: sport endeavors    Comment: fulltime  Tobacco Use  . Smoking status: Never Smoker  . Smokeless tobacco: Never Used  Substance  and Sexual Activity  . Alcohol use: No    Alcohol/week: 0.0 standard drinks  . Drug use: No  . Sexual activity: Yes    Birth control/protection: Injection, Condom  Other Topics Concern  . Not on file  Social History Narrative  . Not on file   Social Determinants of Health   Financial Resource Strain:   . Difficulty of Paying Living Expenses:   Food Insecurity:   . Worried About Charity fundraiser in the Last Year:   . Arboriculturist in the Last Year:   Transportation Needs:   . Film/video editor (Medical):   Marland Kitchen Lack of  Transportation (Non-Medical):   Physical Activity:   . Days of Exercise per Week:   . Minutes of Exercise per Session:   Stress:   . Feeling of Stress :   Social Connections:   . Frequency of Communication with Friends and Family:   . Frequency of Social Gatherings with Friends and Family:   . Attends Religious Services:   . Active Member of Clubs or Organizations:   . Attends Archivist Meetings:   Marland Kitchen Marital Status:     Allergies:  Allergies  Allergen Reactions  . Influenza Vaccines Other (See Comments)    Very sick and was hospitalized   . Sulfa Antibiotics Swelling  . Topiramate Other (See Comments)    Mental fogginess, tingling, numbness    Metabolic Disorder Labs: No results found for: HGBA1C, MPG No results found for: PROLACTIN No results found for: CHOL, TRIG, HDL, CHOLHDL, VLDL, LDLCALC No results found for: TSH  Therapeutic Level Labs: No results found for: LITHIUM No results found for: VALPROATE No components found for:  CBMZ  Current Medications: Current Outpatient Medications  Medication Sig Dispense Refill  . acetaminophen (TYLENOL) 325 MG tablet Take by mouth.    Marland Kitchen aspirin-acetaminophen-caffeine (EXCEDRIN MIGRAINE) 250-250-65 MG tablet Take by mouth.    . baclofen (LIORESAL) 10 MG tablet TAKE 1 TABLET BY MOUTH 2 TIMES DAILY AS NEEDED  3  . busPIRone (BUSPAR) 15 MG tablet Take 1 tablet (15 mg total) by mouth 2 (two) times daily. 60 tablet 2  . cephALEXin (KEFLEX) 500 MG capsule cephalexin 500 mg capsule  TAKE 1 CAPSULE BY MOUTH 4 TIMES A DAY    . eszopiclone (LUNESTA) 2 MG TABS tablet Take 1 tablet (2 mg total) by mouth at bedtime as needed. 30 tablet 1  . fluconazole (DIFLUCAN) 150 MG tablet Take 150 mg by mouth every 3 (three) days.    . fluticasone (FLONASE) 50 MCG/ACT nasal spray Place 2 sprays into both nostrils daily.  12  . gabapentin (NEURONTIN) 300 MG capsule   1  . hydrOXYzine (ATARAX/VISTARIL) 25 MG tablet Take 0.5-1 tablets (12.5-25  mg total) by mouth 2 (two) times daily as needed. For severe anxiety attacks only 60 tablet 1  . ibuprofen (ADVIL,MOTRIN) 600 MG tablet TAKE 1 TABLET BY MOUTH THREE TIMES DAILY WITH A MEAL FOR 7 DAYS  0  . magnesium oxide (MAG-OX) 400 MG tablet Take by mouth.    . medroxyPROGESTERone (DEPO-PROVERA) 150 MG/ML injection Inject 150 mg into the muscle every 3 (three) months. Last injection June 2015    . medroxyPROGESTERone Acetate 150 MG/ML SUSY INJECT 1 ML INTO THE MUSCLE EVERY 3 MONTHS    . meloxicam (MOBIC) 15 MG tablet TAKE 1 TABLET BY MOUTH EVERY DAY (Patient not taking: Reported on 04/30/2020) 30 tablet 3  . methylPREDNISolone (MEDROL DOSEPAK) 4 MG  TBPK tablet Use as directed (Patient not taking: Reported on 04/30/2020) 30 tablet 1  . omeprazole (PRILOSEC) 40 MG capsule Take 40 mg by mouth daily.    . pregabalin (LYRICA) 75 MG capsule Take by mouth.    . pregabalin (LYRICA) 75 MG capsule Take 1 capsule by mouth twice daily    . promethazine (PHENERGAN) 25 MG tablet promethazine 25 mg tablet  TAKE 1 TABLET BY MOUTH EVERY 6 HOURS AS NEEDED FOR NAUSEA    . rizatriptan (MAXALT-MLT) 10 MG disintegrating tablet     . sertraline (ZOLOFT) 100 MG tablet Take 1.5 tablets (150 mg total) by mouth daily. 135 tablet 0  . SUMAtriptan (IMITREX) 20 MG/ACT nasal spray 1 SPRAY INTO LEFT NOSTRIL ONCE AS NEEDED FOR MIGRAINE. MAY REPEAT AFTER 2 HOURS IF NEEDED.  6  . triamterene-hydrochlorothiazide (MAXZIDE-25) 37.5-25 MG tablet Take 0.5 tablets by mouth daily.  6   No current facility-administered medications for this visit.     Musculoskeletal: Strength & Muscle Tone: UTA Gait & Station: unsteady Left LE in cast Patient leans: N/A  Psychiatric Specialty Exam: Review of Systems  Musculoskeletal:       Left ankle pain  Psychiatric/Behavioral: Positive for sleep disturbance. The patient is nervous/anxious.   All other systems reviewed and are negative.   There were no vitals taken for this visit.There is  no height or weight on file to calculate BMI.  General Appearance: Casual  Eye Contact:  Fair  Speech:  Clear and Coherent  Volume:  Normal  Mood:  Anxious  Affect:  Congruent  Thought Process:  Goal Directed and Descriptions of Associations: Intact  Orientation:  Full (Time, Place, and Person)  Thought Content: Logical   Suicidal Thoughts:  No  Homicidal Thoughts:  No  Memory:  Immediate;   Fair Recent;   Fair Remote;   Fair  Judgement:  Fair  Insight:  Fair  Psychomotor Activity:  Normal  Concentration:  Concentration: Fair and Attention Span: Fair  Recall:  [employer] of Knowledge: Fair  Language: Fair  Akathisia:  No  Handed:  Right  AIMS (if indicated): UTA  Assets:  Communication Skills Desire for Improvement Housing Social Support  ADL's:  Intact  Cognition: WNL  Sleep:  restless   Screenings: GAD-7     Video Visit from 04/30/2020 in [place]  Total GAD-7 Score  11    PHQ2-9     Nutrition from 05/07/2017 in [place]  PHQ-2 Total Score  0       Assessment and Plan: Evelyn Flores is a 34 year old Caucasian female, single, employed, lives in [place], has a history of MDD, GAD was evaluated by telemedicine today.  Patient with psychosocial stressors of health issues, current pandemic.  Patient is currently making progress on the current medication regimen however continues to struggle with breakthrough anxiety and sleep issues.  Plan as noted below.  Plan MDD in remission Zoloft as prescribed  GAD-some progress Zoloft 150 mg p.o. daily Increase BuSpar to 15 mg p.o. twice daily Continue CBT with Ms. [name] Hydroxyzine 12.5 to 25 mg p.o. twice daily as needed for anxiety attacks  Insomnia-unspecified-patient with remote history of OSA Lunesta 2 mg p.o. nightly Patient completed home sleep study-pending report.  Follow-up in clinic in 4 to 6 weeks or sooner if needed.  I have spent atleast  20 minutes non face to face with patient today. More than 50 % of the time was  spent for preparing to see the patient ( e.g., review of test, records ),  ordering medications and test ,psychoeducation and supportive psychotherapy and care coordination,as well as documenting clinical information in electronic health record. This note was generated in part or whole with voice recognition software. Voice recognition is usually quite accurate but there are transcription errors that can and very often do occur. I apologize for any typographical errors that were not detected and corrected.        Ursula Alert, MD 05/24/2020, 5:07 PM

## 2020-06-20 ENCOUNTER — Telehealth: Payer: Self-pay

## 2020-06-20 DIAGNOSIS — F411 Generalized anxiety disorder: Secondary | ICD-10-CM

## 2020-06-20 MED ORDER — SERTRALINE HCL 100 MG PO TABS: 200.0000 mg | ORAL_TABLET | Freq: Every day | ORAL | 0 refills | Status: DC

## 2020-06-20 NOTE — Telephone Encounter (Signed)
pt called left message that she is having muscle spasms with the medication changes that you made.

## 2020-06-20 NOTE — Telephone Encounter (Signed)
Returned call to patient.  Patient reports muscle spasms since going up on the BuSpar.  Discussed to discontinue BuSpar.  A week after stopping the BuSpar she could increase the Zoloft to 200 mg since she continues to be anxious.

## 2020-07-23 ENCOUNTER — Encounter: Payer: Self-pay | Admitting: Psychiatry

## 2020-07-23 ENCOUNTER — Telehealth (INDEPENDENT_AMBULATORY_CARE_PROVIDER_SITE_OTHER): Payer: 59 | Admitting: Psychiatry

## 2020-07-23 ENCOUNTER — Other Ambulatory Visit: Payer: Self-pay

## 2020-07-23 DIAGNOSIS — F411 Generalized anxiety disorder: Secondary | ICD-10-CM

## 2020-07-23 DIAGNOSIS — G4701 Insomnia due to medical condition: Secondary | ICD-10-CM | POA: Diagnosis not present

## 2020-07-23 DIAGNOSIS — F3342 Major depressive disorder, recurrent, in full remission: Secondary | ICD-10-CM

## 2020-07-23 MED ORDER — SERTRALINE HCL 100 MG PO TABS: 200.0000 mg | ORAL_TABLET | Freq: Every day | ORAL | 0 refills | Status: DC

## 2020-07-23 NOTE — Progress Notes (Signed)
Provider Location : [facility] Patient Location : [place]  Virtual Visit via Video Note  I connected with Evelyn Flores on 07/23/20 at  4:20 PM EDT by a video enabled telemedicine application and verified that I am speaking with the correct person using two identifiers.   I discussed the limitations of evaluation and management by telemedicine and the availability of in person appointments. The patient expressed understanding and agreed to proceed.   I discussed the assessment and treatment plan with the patient. The patient was provided an opportunity to ask questions and all were answered. The patient agreed with the plan and demonstrated an understanding of the instructions.   The patient was advised to call back or seek an in-person evaluation if the symptoms worsen or if the condition fails to improve as anticipated.  [place] MD OP Progress Note  07/23/2020 4:45 PM Evelyn Flores  MRN:  [id number]  Chief Complaint:  Chief Complaint    Follow-up     HPI: Evelyn Flores is a 34 year old Caucasian female, single, lives at [place] with parents, employed, has a history of MDD, GAD, insomnia was evaluated by telemedicine today.  Patient today reports her anxiety symptoms is improving on the current dosage of Zoloft.  She is tolerating the higher dosage well.  Since stopping the BuSpar her muscle spasms have improved.  Patient reports she continues to struggle with sleep.  She reports even when she sleeps 8 hours she wakes up feeling not rested.  She also has fatigue and tiredness during the day.  She had sleep study done as recommended and reports she came positive for obstructive sleep apnea.  She however reports she continues to be not able to get her CPAP device and is currently waiting for the same.  This is frustrating for the patient.  She continues to use Lunesta as prescribed.  She denies side effects.  The Lunesta does help.  Patient reports work is going okay.  Patient denies any  suicidality, homicidality or perceptual disturbances.  She continues to follow-up with her therapist Ms. Miguel Flores and reports therapy sessions is beneficial.  Patient denies any other concerns today.  Visit Diagnosis:    ICD-10-CM   1. MDD (major depressive disorder), recurrent, in full remission ([place])  F33.42   2. GAD (generalized anxiety disorder)  F41.1   3. 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.01     Past Psychiatric History: I have reviewed past psychiatric history from my progress note on 03/18/2018  Past Medical History:  Past Medical History:  Diagnosis Date  . Anxiety   . Depression   . DVT (deep venous thrombosis) ([place])   . Ear infection     Past Surgical History:  Procedure Laterality Date  . CHOLECYSTECTOMY    . VEIN SURGERY Bilateral    vein surgery both legs    Family Psychiatric History: Reviewed family psychiatric history from my progress note on 03/18/2018  Family History:  Family History  Problem Relation Age of Onset  . Panic disorder Mother   . Dementia Paternal Grandfather     Social History: Reviewed social history from my progress note on 03/18/2018 Social History   Socioeconomic History  . Marital status: Single    Spouse name: Not on file  . Number of children: 0  . Years of education: Not on file  . Highest education level: High school graduate  Occupational History  . Occupation: sport endeavors    Comment: fulltime  Tobacco Use  .  Smoking status: Never Smoker  . Smokeless tobacco: Never Used  Vaping Use  . Vaping Use: Never used  Substance and Sexual Activity  . Alcohol use: No    Alcohol/week: 0.0 standard drinks  . Drug use: No  . Sexual activity: Yes    Birth control/protection: Injection, Condom  Other Topics Concern  . Not on file  Social History Narrative  . Not on file   Social Determinants of Health   Financial Resource Strain:   . Difficulty of Paying Living Expenses:   Food Insecurity:   . Worried About  Charity fundraiser in the Last Year:   . Arboriculturist in the Last Year:   Transportation Needs:   . Film/video editor (Medical):   Evelyn Kitchen Lack of Transportation (Non-Medical):   Physical Activity:   . Days of Exercise per Week:   . Minutes of Exercise per Session:   Stress:   . Feeling of Stress :   Social Connections:   . Frequency of Communication with Friends and Family:   . Frequency of Social Gatherings with Friends and Family:   . Attends Religious Services:   . Active Member of Clubs or Organizations:   . Attends Archivist Meetings:   Evelyn Kitchen Marital Status:     Allergies:  Allergies  Allergen Reactions  . Buspar [Buspirone]     spasms  . Influenza Vaccines Other (See Comments)    Very sick and was hospitalized   . Sulfa Antibiotics Swelling  . Topiramate Other (See Comments)    Mental fogginess, tingling, numbness    Metabolic Disorder Labs: No results found for: HGBA1C, MPG No results found for: PROLACTIN No results found for: CHOL, TRIG, HDL, CHOLHDL, VLDL, LDLCALC No results found for: TSH  Therapeutic Level Labs: No results found for: LITHIUM No results found for: VALPROATE No components found for:  CBMZ  Current Medications: Current Outpatient Medications  Medication Sig Dispense Refill  . acetaminophen (TYLENOL) 325 MG tablet Take by mouth.    Evelyn Kitchen aspirin-acetaminophen-caffeine (EXCEDRIN MIGRAINE) 250-250-65 MG tablet Take by mouth.    . baclofen (LIORESAL) 10 MG tablet TAKE 1 TABLET BY MOUTH 2 TIMES DAILY AS NEEDED  3  . cephALEXin (KEFLEX) 500 MG capsule cephalexin 500 mg capsule  TAKE 1 CAPSULE BY MOUTH 4 TIMES A DAY    . eszopiclone (LUNESTA) 2 MG TABS tablet Take 1 tablet (2 mg total) by mouth at bedtime as needed. 30 tablet 1  . fluconazole (DIFLUCAN) 150 MG tablet Take 150 mg by mouth every 3 (three) days.    . fluticasone (FLONASE) 50 MCG/ACT nasal spray Place 2 sprays into both nostrils daily.  12  . gabapentin (NEURONTIN) 300 MG  capsule   1  . hydrOXYzine (ATARAX/VISTARIL) 25 MG tablet Take 0.5-1 tablets (12.5-25 mg total) by mouth 2 (two) times daily as needed. For severe anxiety attacks only 60 tablet 1  . ibuprofen (ADVIL,MOTRIN) 600 MG tablet TAKE 1 TABLET BY MOUTH THREE TIMES DAILY WITH A MEAL FOR 7 DAYS  0  . magnesium oxide (MAG-OX) 400 MG tablet Take by mouth.    . medroxyPROGESTERone (DEPO-PROVERA) 150 MG/ML injection Inject 150 mg into the muscle every 3 (three) months. Last injection June 2015    . medroxyPROGESTERone Acetate 150 MG/ML SUSY INJECT 1 ML INTO THE MUSCLE EVERY 3 MONTHS    . meloxicam (MOBIC) 15 MG tablet TAKE 1 TABLET BY MOUTH EVERY DAY (Patient not taking: Reported on 04/30/2020) 30 tablet  3  . methylPREDNISolone (MEDROL DOSEPAK) 4 MG TBPK tablet Use as directed (Patient not taking: Reported on 04/30/2020) 30 tablet 1  . omeprazole (PRILOSEC) 40 MG capsule Take 40 mg by mouth daily.    . pregabalin (LYRICA) 75 MG capsule Take by mouth.    . pregabalin (LYRICA) 75 MG capsule Take 1 capsule by mouth twice daily    . promethazine (PHENERGAN) 25 MG tablet promethazine 25 mg tablet  TAKE 1 TABLET BY MOUTH EVERY 6 HOURS AS NEEDED FOR NAUSEA    . rizatriptan (MAXALT-MLT) 10 MG disintegrating tablet     . sertraline (ZOLOFT) 100 MG tablet Take 2 tablets (200 mg total) by mouth daily. Patient has supplies 135 tablet 0  . SUMAtriptan (IMITREX) 20 MG/ACT nasal spray 1 SPRAY INTO LEFT NOSTRIL ONCE AS NEEDED FOR MIGRAINE. MAY REPEAT AFTER 2 HOURS IF NEEDED.  6  . triamterene-hydrochlorothiazide (MAXZIDE-25) 37.5-25 MG tablet Take 0.5 tablets by mouth daily.  6   No current facility-administered medications for this visit.     Musculoskeletal: Strength & Muscle Tone: UTA Gait & Station: normal Patient leans: N/A  Psychiatric Specialty Exam: Review of Systems  Constitutional: Positive for fatigue.  Psychiatric/Behavioral: Positive for sleep disturbance. The patient is nervous/anxious.   All other  systems reviewed and are negative.   There were no vitals taken for this visit.There is no height or weight on file to calculate BMI.  General Appearance: Casual  Eye Contact:  Fair  Speech:  Clear and Coherent  Volume:  Normal  Mood:  Anxious improving  Affect:  Congruent  Thought Process:  Goal Directed and Descriptions of Associations: Intact  Orientation:  Full (Time, Place, and Person)  Thought Content: Logical   Suicidal Thoughts:  No  Homicidal Thoughts:  No  Memory:  Immediate;   Fair Recent;   Fair Remote;   Fair  Judgement:  Fair  Insight:  Fair  Psychomotor Activity:  Normal  Concentration:  Concentration: Fair and Attention Span: Fair  Recall:  [employer] of Knowledge: Fair  Language: Fair  Akathisia:  No  Handed:  Right  AIMS (if indicated): UTA  Assets:  Communication Skills Desire for Improvement Social Support  ADL's:  Intact  Cognition: WNL  Sleep:  Restless   Screenings: GAD-7     Video Visit from 04/30/2020 in [facility]  Total GAD-7 Score 11    PHQ2-9     Nutrition from 05/07/2017 in [facility]  PHQ-2 Total Score 0       Assessment and Plan: Evelyn Flores is a 34 year old Caucasian female, single, employed, has a history of MDD, GAD was evaluated by telemedicine today.  Patient with psychosocial stressors of health issues, current pandemic.  Patient is currently making progress with regards to her anxiety on the increased dosage of Zoloft however continues to struggle with fatigue and tiredness.  She was recently diagnosed with obstructive sleep apnea however continues to wait for her CPAP due to having health insurance problems.  Discussed plan as noted below.  Plan MDD in remission Zoloft 200 mg p.o. daily Continue CBT.  GAD-improving Zoloft 200 mg p.o. daily Continue CBT with Ms. Miguel Flores Hydroxyzine 12.5 to 25 mg p.o. twice daily as needed for anxiety attacks  Insomnia-due to  medical condition-unstable Patient had sleep study done-she reports she was diagnosed with OSA-pending report. Patient however reports she continues to wait for her CPAP device since she is having issues with her health  insurance approving it.  She will continue to follow-up with her providers for the same. Continue Lunesta 2 mg p.o. nightly. I have reviewed [place] controlled substance database. Since patient has OSA she will benefit from CPAP device , will not make further changes with her Lunesta dosage today.  Follow-up in clinic in 4 to 6 weeks or sooner if needed.  I have spent atleast 20 minutes non face to face with patient today. More than 50 % of the time was spent for preparing to see the patient ( e.g., review of test, records ), ordering medications and test ,psychoeducation and supportive psychotherapy and care coordination,as well as documenting clinical information in electronic health record. This note was generated in part or whole with voice recognition software. Voice recognition is usually quite accurate but there are transcription errors that can and very often do occur. I apologize for any typographical errors that were not detected and corrected.        Ursula Alert, MD 07/23/2020, 4:45 PM

## 2020-09-07 ENCOUNTER — Encounter: Payer: Self-pay | Admitting: Psychiatry

## 2020-09-07 ENCOUNTER — Telehealth (INDEPENDENT_AMBULATORY_CARE_PROVIDER_SITE_OTHER): Payer: 59 | Admitting: Psychiatry

## 2020-09-07 ENCOUNTER — Other Ambulatory Visit: Payer: Self-pay

## 2020-09-07 DIAGNOSIS — F411 Generalized anxiety disorder: Secondary | ICD-10-CM | POA: Diagnosis not present

## 2020-09-07 DIAGNOSIS — G4701 Insomnia due to medical condition: Secondary | ICD-10-CM | POA: Diagnosis not present

## 2020-09-07 DIAGNOSIS — F3342 Major depressive disorder, recurrent, in full remission: Secondary | ICD-10-CM

## 2020-09-07 NOTE — Progress Notes (Signed)
Provider Location : [facility] Patient Location : Home  Participants: Patient , Provider  Virtual Visit via Video Note  I connected with Evelyn Flores on 09/07/20 at  9:00 AM EDT by a video enabled telemedicine application and verified that I am speaking with the correct person using two identifiers.   I discussed the limitations of evaluation and management by telemedicine and the availability of in person appointments. The patient expressed understanding and agreed to proceed.     I discussed the assessment and treatment plan with the patient. The patient was provided an opportunity to ask questions and all were answered. The patient agreed with the plan and demonstrated an understanding of the instructions.   The patient was advised to call back or seek an in-person evaluation if the symptoms worsen or if the condition fails to improve as anticipated.   [facility] MD OP Progress Note  09/07/2020 12:55 PM Evelyn Flores  MRN:  [id number]  Chief Complaint:  Chief Complaint    Follow-up     HPI: Evelyn Flores is a 34 year old Caucasian female, single, lives at [place] with parents, employed, has a history of MDD, GAD, insomnia was evaluated by telemedicine today.  Patient today reports she is currently doing well with regards to her mood.  Denies any significant anxiety or depression.  She is compliant on the Zoloft.  Denies side effects.  Patient denies any suicidality, homicidality or perceptual disturbances.  She reports sleep is improved since she started using CPAP.  She reports her fatigue has improved a lot and she has noticed a big change in the way she is able to function during the day since she started using CPAP.  Patient reports work is busy however she has been coping okay.  She denies any other concerns today.  Visit Diagnosis:    ICD-10-CM   1. MDD (major depressive disorder), recurrent, in full remission ([place])  F33.42   2. GAD (generalized anxiety disorder)  F41.1    3. 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.01     Past Psychiatric History: I have reviewed past psychiatric history from my progress note on 03/18/2018  Past Medical History:  Past Medical History:  Diagnosis Date  . Anxiety   . Depression   . DVT (deep venous thrombosis) ([place])   . Ear infection     Past Surgical History:  Procedure Laterality Date  . CHOLECYSTECTOMY    . VEIN SURGERY Bilateral    vein surgery both legs    Family Psychiatric History: I have reviewed family psychiatric history from my progress note on 03/18/2018  Family History:  Family History  Problem Relation Age of Onset  . Panic disorder Mother   . Dementia Paternal Grandfather     Social History: I have reviewed social history from my progress note on 03/18/2018 Social History   Socioeconomic History  . Marital status: Single    Spouse name: Not on file  . Number of children: 0  . Years of education: Not on file  . Highest education level: High school graduate  Occupational History  . Occupation: sport endeavors    Comment: fulltime  Tobacco Use  . Smoking status: Never Smoker  . Smokeless tobacco: Never Used  Vaping Use  . Vaping Use: Never used  Substance and Sexual Activity  . Alcohol use: No    Alcohol/week: 0.0 standard drinks  . Drug use: No  . Sexual activity: Yes    Birth control/protection: Injection, Condom  Other Topics Concern  . Not on file  Social History Narrative  . Not on file   Social Determinants of Health   Financial Resource Strain:   . Difficulty of Paying Living Expenses: Not on file  Food Insecurity:   . Worried About Charity fundraiser in the Last Year: Not on file  . Ran Out of Food in the Last Year: Not on file  Transportation Needs:   . Lack of Transportation (Medical): Not on file  . Lack of Transportation (Non-Medical): Not on file  Physical Activity:   . Days of Exercise per Week: Not on file  . Minutes of Exercise per Session: Not on file   Stress:   . Feeling of Stress : Not on file  Social Connections:   . Frequency of Communication with Friends and Family: Not on file  . Frequency of Social Gatherings with Friends and Family: Not on file  . Attends Religious Services: Not on file  . Active Member of Clubs or Organizations: Not on file  . Attends Archivist Meetings: Not on file  . Marital Status: Not on file    Allergies:  Allergies  Allergen Reactions  . Buspar [Buspirone]     spasms  . Influenza Vaccines Other (See Comments)    Very sick and was hospitalized   . Sulfa Antibiotics Swelling  . Topiramate Other (See Comments)    Mental fogginess, tingling, numbness    Metabolic Disorder Labs: No results found for: HGBA1C, MPG No results found for: PROLACTIN No results found for: CHOL, TRIG, HDL, CHOLHDL, VLDL, LDLCALC No results found for: TSH  Therapeutic Level Labs: No results found for: LITHIUM No results found for: VALPROATE No components found for:  CBMZ  Current Medications: Current Outpatient Medications  Medication Sig Dispense Refill  . acetaminophen (TYLENOL) 325 MG tablet Take by mouth.    Marland Kitchen aspirin-acetaminophen-caffeine (EXCEDRIN MIGRAINE) 250-250-65 MG tablet Take by mouth.    . baclofen (LIORESAL) 10 MG tablet TAKE 1 TABLET BY MOUTH 2 TIMES DAILY AS NEEDED  3  . cephALEXin (KEFLEX) 500 MG capsule cephalexin 500 mg capsule  TAKE 1 CAPSULE BY MOUTH 4 TIMES A DAY    . eszopiclone (LUNESTA) 2 MG TABS tablet Take 1 tablet (2 mg total) by mouth at bedtime as needed. 30 tablet 1  . fluconazole (DIFLUCAN) 150 MG tablet Take 150 mg by mouth every 3 (three) days.    . fluticasone (FLONASE) 50 MCG/ACT nasal spray Place 2 sprays into both nostrils daily.  12  . gabapentin (NEURONTIN) 300 MG capsule   1  . hydrOXYzine (ATARAX/VISTARIL) 25 MG tablet Take 0.5-1 tablets (12.5-25 mg total) by mouth 2 (two) times daily as needed. For severe anxiety attacks only 60 tablet 1  . ibuprofen  (ADVIL,MOTRIN) 600 MG tablet TAKE 1 TABLET BY MOUTH THREE TIMES DAILY WITH A MEAL FOR 7 DAYS  0  . magnesium oxide (MAG-OX) 400 MG tablet Take by mouth.    . medroxyPROGESTERone (DEPO-PROVERA) 150 MG/ML injection Inject 150 mg into the muscle every 3 (three) months. Last injection June 2015    . medroxyPROGESTERone Acetate 150 MG/ML SUSY INJECT 1 ML INTO THE MUSCLE EVERY 3 MONTHS    . meloxicam (MOBIC) 15 MG tablet TAKE 1 TABLET BY MOUTH EVERY DAY (Patient not taking: Reported on 04/30/2020) 30 tablet 3  . methylPREDNISolone (MEDROL DOSEPAK) 4 MG TBPK tablet Use as directed (Patient not taking: Reported on 04/30/2020) 30 tablet 1  . naproxen (NAPROSYN)  500 MG tablet Take 500 mg by mouth 2 (two) times daily.    Marland Kitchen omeprazole (PRILOSEC) 40 MG capsule Take 40 mg by mouth daily.    . pregabalin (LYRICA) 75 MG capsule Take by mouth.    . pregabalin (LYRICA) 75 MG capsule Take 1 capsule by mouth twice daily    . promethazine (PHENERGAN) 25 MG tablet promethazine 25 mg tablet  TAKE 1 TABLET BY MOUTH EVERY 6 HOURS AS NEEDED FOR NAUSEA    . rizatriptan (MAXALT-MLT) 10 MG disintegrating tablet     . sertraline (ZOLOFT) 100 MG tablet Take 2 tablets (200 mg total) by mouth daily. 180 tablet 0  . SUMAtriptan (IMITREX) 20 MG/ACT nasal spray 1 SPRAY INTO LEFT NOSTRIL ONCE AS NEEDED FOR MIGRAINE. MAY REPEAT AFTER 2 HOURS IF NEEDED.  6  . triamterene-hydrochlorothiazide (MAXZIDE-25) 37.5-25 MG tablet Take 0.5 tablets by mouth daily.  6   No current facility-administered medications for this visit.     Musculoskeletal: Strength & Muscle Tone: UTA Gait & Station: normal Patient leans: N/A  Psychiatric Specialty Exam: Review of Systems  Constitutional: Positive for fatigue (Improved since she started CPAP use).  Psychiatric/Behavioral: Negative for agitation, behavioral problems, confusion, decreased concentration, dysphoric mood, hallucinations, self-injury, sleep disturbance and suicidal ideas. The patient  is not nervous/anxious and is not hyperactive.   All other systems reviewed and are negative.   There were no vitals taken for this visit.There is no height or weight on file to calculate BMI.  General Appearance: Casual  Eye Contact:  Fair  Speech:  Normal Rate  Volume:  Normal  Mood:  Euthymic  Affect:  Congruent  Thought Process:  Goal Directed and Descriptions of Associations: Intact  Orientation:  Full (Time, Place, and Person)  Thought Content: Logical   Suicidal Thoughts:  No  Homicidal Thoughts:  No  Memory:  Immediate;   Fair Recent;   Fair Remote;   Fair  Judgement:  Fair  Insight:  Fair  Psychomotor Activity:  Normal  Concentration:  Concentration: Fair and Attention Span: Fair  Recall:  [employer] of Knowledge: Fair  Language: Fair  Akathisia:  No  Handed:  Right  AIMS (if indicated): UTA  Assets:  Communication Skills Desire for Improvement Social Support  ADL's:  Intact  Cognition: WNL  Sleep:  Fair   Screenings: GAD-7     Video Visit from 04/30/2020 in [facility]  Total GAD-7 Score 11    PHQ2-9     Nutrition from 05/07/2017 in [facility]  PHQ-2 Total Score 0       Assessment and Plan: Evelyn Flores is a 34 year old Caucasian female, single, employed, has a history of MDD, GAD was evaluated by telemedicine today.  Patient with psychosocial stressors of health issues, current pandemic, is currently making progress with regards to her mood as well as sleep.  Plan as noted below.  Plan MDD in remission Zoloft 200 mg p.o. daily Continue CBT  GAD-stable Zoloft 200 mg p.o. daily Continue CBT with Ms. [name] Hydroxyzine 12.5 to 25 mg p.o. twice daily as needed for anxiety attacks  Insomnia due to medical condition-stable Patient reports she has started using CPAP which has helped her tremendously. Discussed with patient to cut the Lunesta and to 1 mg a few times a week to see if she can  still sleep now that she is on CPAP. Continue Lunesta as prescribed.   Follow-up in clinic in 2 to 3  months or sooner if needed.  I have spent atleast 20 minutes face to face with patient today. More than 50 % of the time was spent for preparing to see the patient ( e.g., review of test, records ),  ordering medications and test ,psychoeducation and supportive psychotherapy and care coordination,as well as documenting clinical information in electronic health record. This note was generated in part or whole with voice recognition software. Voice recognition is usually quite accurate but there are transcription errors that can and very often do occur. I apologize for any typographical errors that were not detected and corrected.      Ursula Alert, MD 09/07/2020, 12:55 PM

## 2020-09-13 ENCOUNTER — Ambulatory Visit (INDEPENDENT_AMBULATORY_CARE_PROVIDER_SITE_OTHER): Payer: Managed Care, Other (non HMO) | Admitting: Vascular Surgery

## 2020-10-01 ENCOUNTER — Encounter (INDEPENDENT_AMBULATORY_CARE_PROVIDER_SITE_OTHER): Payer: Self-pay | Admitting: Vascular Surgery

## 2020-10-01 ENCOUNTER — Other Ambulatory Visit: Payer: Self-pay

## 2020-10-01 ENCOUNTER — Ambulatory Visit (INDEPENDENT_AMBULATORY_CARE_PROVIDER_SITE_OTHER): Payer: Managed Care, Other (non HMO) | Admitting: Vascular Surgery

## 2020-10-01 VITALS — BP 109/70 | HR 75 | Resp 17 | Ht 66.0 in | Wt 324.0 lb

## 2020-10-01 DIAGNOSIS — I890 Lymphedema, not elsewhere classified: Secondary | ICD-10-CM

## 2020-10-01 DIAGNOSIS — I10 Essential (primary) hypertension: Secondary | ICD-10-CM | POA: Diagnosis not present

## 2020-10-01 DIAGNOSIS — I872 Venous insufficiency (chronic) (peripheral): Secondary | ICD-10-CM

## 2020-10-01 NOTE — Progress Notes (Signed)
MRN : [id number]  Evelyn Flores is a 34 y.o. (08-16-1986) female who presents with chief complaint of  Chief Complaint  Patient presents with  . Follow-up    6 mos no studies  .  History of Present Illness:   The patient returns to the office for followup evaluation regarding leg swelling.  The swelling has persisted but with the lymph pump is much, much better controlled. The pain associated with swelling is essentially eliminated. There have not been any interval development of a ulcerations or wounds.  The patient denies problems with the pump, noting it is working well and the leggings are in good condition.  Since the previous visit the patient has been wearing graduated compression stockings and using the lymph pump on a routine basis and  has noted significant improvement in the lymphedema.   Patient stated the lymph pump has been a very positive factor in her care.    No outpatient medications have been marked as taking for the 10/01/20 encounter (Office Visit) with [name], Dolores Lory, MD.    Past Medical History:  Diagnosis Date  . Anxiety   . Depression   . DVT (deep venous thrombosis) ([place])   . Ear infection     Past Surgical History:  Procedure Laterality Date  . CHOLECYSTECTOMY    . VEIN SURGERY Bilateral    vein surgery both legs    Social History Social History   Tobacco Use  . Smoking status: Never Smoker  . Smokeless tobacco: Never Used  Vaping Use  . Vaping Use: Never used  Substance Use Topics  . Alcohol use: No    Alcohol/week: 0.0 standard drinks  . Drug use: No    Family History Family History  Problem Relation Age of Onset  . Panic disorder Mother   . Dementia Paternal Grandfather     Allergies  Allergen Reactions  . Buspar [Buspirone]     spasms  . Influenza Vaccines Other (See Comments)    Very sick and was hospitalized   . Sulfa Antibiotics Swelling  . Topiramate Other (See Comments)    Mental fogginess, tingling,  numbness     REVIEW OF SYSTEMS (Negative unless checked)  Constitutional: [] Weight loss  [] Fever  [] Chills Cardiac: [] Chest pain   [] Chest pressure   [] Palpitations   [] Shortness of breath when laying flat   [] Shortness of breath with exertion. Vascular:  [] Pain in legs with walking   [x] Pain in legs at rest  [] History of DVT   [] Phlebitis   [x] Swelling in legs   [] Varicose veins   [] Non-healing ulcers Pulmonary:   [] Uses home oxygen   [] Productive cough   [] Hemoptysis   [] Wheeze  [] COPD   [] Asthma Neurologic:  [] Dizziness   [] Seizures   [] History of stroke   [] History of TIA  [] Aphasia   [] Vissual changes   [] Weakness or numbness in arm   [] Weakness or numbness in leg Musculoskeletal:   [] Joint swelling   [] Joint pain   [] Low back pain Hematologic:  [] Easy bruising  [] Easy bleeding   [] Hypercoagulable state   [] Anemic Gastrointestinal:  [] Diarrhea   [] Vomiting  [x] Gastroesophageal reflux/heartburn   [] Difficulty swallowing. Genitourinary:  [] Chronic kidney disease   [] Difficult urination  [] Frequent urination   [] Blood in urine Skin:  [] Rashes   [] Ulcers  Psychological:  [] History of anxiety   []  History of major depression.  Physical Examination  There were no vitals filed for this visit. There is no height or weight on file  to calculate BMI. Gen: WD/WN, NAD Head: [place]/AT, No temporalis wasting.  Ear/Nose/Throat: Hearing grossly intact, nares w/o erythema or drainage Eyes: PER, EOMI, sclera nonicteric.  Neck: Supple, no large masses.   Pulmonary:  Good air movement, no audible wheezing bilaterally, no use of accessory muscles.  Cardiac: RRR, no JVD Vascular: scattered varicosities present bilaterally.  Mild venous stasis changes to the legs bilaterally.  2-3+ soft pitting edema Vessel Right Left  Radial Palpable Palpable  Gastrointestinal: Non-distended. No guarding/no peritoneal signs.  Musculoskeletal: M/S 5/5 throughout.  No deformity or atrophy.  Neurologic: CN 2-12 intact.  Symmetrical.  Speech is fluent. Motor exam as listed above. Psychiatric: Judgment intact, Mood & affect appropriate for pt's clinical situation. Dermatologic: Mild venous rashes no ulcers noted.  No changes consistent with cellulitis. Lymph : No lichenification or skin changes of chronic lymphedema.  CBC Lab Results  Component Value Date   WBC 9.1 01/26/2019   HGB 13.4 01/26/2019   HCT 39.0 01/26/2019   MCV 83.9 01/26/2019   PLT 228 01/26/2019    BMET    Component Value Date/Time   NA 136 01/26/2019 2159   NA 140 12/15/2014 2220   K 3.3 (L) 01/26/2019 2159   K 3.6 12/15/2014 2220   CL 106 01/26/2019 2159   CL 106 12/15/2014 2220   CO2 24 01/26/2019 2159   CO2 29 12/15/2014 2220   GLUCOSE 100 (H) 01/26/2019 2159   GLUCOSE 85 12/15/2014 2220   BUN 13 01/26/2019 2159   BUN 12 12/15/2014 2220   CREATININE 0.75 01/26/2019 2159   CREATININE 0.94 12/15/2014 2220   CALCIUM 9.2 01/26/2019 2159   CALCIUM 9.0 12/15/2014 2220   GFRNONAA >60 01/26/2019 2159   GFRNONAA >60 12/15/2014 2220   GFRAA >60 01/26/2019 2159   GFRAA >60 12/15/2014 2220   CrCl cannot be calculated (Patient's most recent lab result is older than the maximum 21 days allowed.).  COAG No results found for: INR, PROTIME  Radiology No results found.    Assessment/Plan 1. Lymphedema of both lower extremities  No surgery or intervention at this point in time.    I have reviewed my discussion with the patient regarding lymphedema and why it  causes symptoms.  Patient will continue wearing graduated compression stockings class 1 (20-30 mmHg) on a daily basis a prescription was given. The patient is reminded to put the stockings on first thing in the morning and removing them in the evening. The patient is instructed specifically not to sleep in the stockings.   In addition, behavioral modification throughout the day will be continued.  This will include frequent elevation (such as in a recliner), use of over the  counter pain medications as needed and exercise such as walking.  I have reviewed systemic causes for chronic edema such as liver, kidney and cardiac etiologies and there does not appear to be any significant changes in these organ systems over the past year.  The patient is under the impression that these organ systems are all stable and unchanged.    The patient will continue aggressive use of the  lymph pump.  This will continue to improve the edema control and prevent sequela such as ulcers and infections.   The patient will follow-up with me on an annual basis.    2. Venous insufficiency  No surgery or intervention at this point in time.    I have reviewed my discussion with the patient regarding lymphedema and why it  causes symptoms.  Patient will continue wearing graduated compression stockings class 1 (20-30 mmHg) on a daily basis a prescription was given. The patient is reminded to put the stockings on first thing in the morning and removing them in the evening. The patient is instructed specifically not to sleep in the stockings.   In addition, behavioral modification throughout the day will be continued.  This will include frequent elevation (such as in a recliner), use of over the counter pain medications as needed and exercise such as walking.  I have reviewed systemic causes for chronic edema such as liver, kidney and cardiac etiologies and there does not appear to be any significant changes in these organ systems over the past year.  The patient is under the impression that these organ systems are all stable and unchanged.    The patient will continue aggressive use of the  lymph pump.  This will continue to improve the edema control and prevent sequela such as ulcers and infections.   The patient will follow-up with me on an annual basis.    3. Essential hypertension Continue antihypertensive medications as already ordered, these medications have been reviewed and there are  no changes at this time.    Hortencia Pilar, MD  10/01/2020 9:34 AM

## 2020-10-17 ENCOUNTER — Telehealth: Payer: Self-pay

## 2020-10-17 DIAGNOSIS — G4700 Insomnia, unspecified: Secondary | ICD-10-CM

## 2020-10-17 MED ORDER — ESZOPICLONE 2 MG PO TABS: 2.0000 mg | ORAL_TABLET | Freq: Every evening | ORAL | 3 refills | Status: DC | PRN

## 2020-10-17 NOTE — Telephone Encounter (Signed)
eceived fax requesting a refill on the eszopiclone 2mg 

## 2020-10-17 NOTE — Telephone Encounter (Signed)
I have sent Lunesta to pharmacy.

## 2020-11-20 ENCOUNTER — Encounter: Payer: Self-pay | Admitting: Psychiatry

## 2020-11-20 ENCOUNTER — Telehealth (INDEPENDENT_AMBULATORY_CARE_PROVIDER_SITE_OTHER): Payer: 59 | Admitting: Psychiatry

## 2020-11-20 ENCOUNTER — Other Ambulatory Visit: Payer: Self-pay

## 2020-11-20 DIAGNOSIS — F3342 Major depressive disorder, recurrent, in full remission: Secondary | ICD-10-CM

## 2020-11-20 DIAGNOSIS — F411 Generalized anxiety disorder: Secondary | ICD-10-CM | POA: Diagnosis not present

## 2020-11-20 DIAGNOSIS — G4701 Insomnia due to medical condition: Secondary | ICD-10-CM

## 2020-11-20 MED ORDER — SERTRALINE HCL 100 MG PO TABS: 200.0000 mg | ORAL_TABLET | Freq: Every day | ORAL | 0 refills | Status: DC

## 2020-11-20 NOTE — Progress Notes (Signed)
Virtual Visit via Video Note  I connected with Evelyn Flores on 11/20/20 at  8:30 AM EST by a video enabled telemedicine application and verified that I am speaking with the correct person using two identifiers.  Location Provider Location : [facility] Patient Location : Home  Participants: Patient , Provider    I discussed the limitations of evaluation and management by telemedicine and the availability of in person appointments. The patient expressed understanding and agreed to proceed.     I discussed the assessment and treatment plan with the patient. The patient was provided an opportunity to ask questions and all were answered. The patient agreed with the plan and demonstrated an understanding of the instructions.   The patient was advised to call back or seek an in-person evaluation if the symptoms worsen or if the condition fails to improve as anticipated.   [place] MD OP Progress Note  11/20/2020 8:58 AM CHANDEL ZAUN  MRN:  [id number]  Chief Complaint:  Chief Complaint    Follow-up     HPI: Evelyn Flores is a 34 year old Caucasian female, single, lives at [place] with parents, employed, has a history of MDD, GAD, insomnia was evaluated by telemedicine today.  Patient today reports she is currently doing well.  Denies any significant anxiety or depressive symptoms.  Patient reports she is sleeping better.  She is using the CPAP which has made a tremendous difference.  She does not feel fatigued during the day since started using the CPAP.  She reports work continues to be busy and stressful however she is coping okay.  She spent her Thanksgiving holidays with her family which was good.  She is compliant on the Zoloft.  She takes the [place] as needed.  Denies side effects.  She denies any suicidality, homicidality or perceptual disturbances.  Visit Diagnosis:    ICD-10-CM   1. MDD (major depressive disorder), recurrent, in full remission ([place])  F33.42   2. GAD  (generalized anxiety disorder)  F41.1 sertraline (ZOLOFT) 100 MG tablet  3. 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.01     Past Psychiatric History: I have reviewed past psychiatric history from my progress note on 03/18/2018  Past Medical History:  Past Medical History:  Diagnosis Date  . Anxiety   . Depression   . DVT (deep venous thrombosis) ([place])   . Ear infection     Past Surgical History:  Procedure Laterality Date  . CHOLECYSTECTOMY    . VEIN SURGERY Bilateral    vein surgery both legs    Family Psychiatric History: I have reviewed family psychiatric history from my progress note on 03/18/2018  Family History:  Family History  Problem Relation Age of Onset  . Panic disorder Mother   . Dementia Paternal Grandfather     Social History: I have reviewed social history from my progress note on 03/18/2018 Social History   Socioeconomic History  . Marital status: Single    Spouse name: Not on file  . Number of children: 0  . Years of education: Not on file  . Highest education level: High school graduate  Occupational History  . Occupation: sport endeavors    Comment: fulltime  Tobacco Use  . Smoking status: Never Smoker  . Smokeless tobacco: Never Used  Vaping Use  . Vaping Use: Never used  Substance and Sexual Activity  . Alcohol use: No    Alcohol/week: 0.0 standard drinks  . Drug use: No  . Sexual activity: Yes  Birth control/protection: Injection, Condom  Other Topics Concern  . Not on file  Social History Narrative  . Not on file   Social Determinants of Health   Financial Resource Strain:   . Difficulty of Paying Living Expenses: Not on file  Food Insecurity:   . Worried About Charity fundraiser in the Last Year: Not on file  . Ran Out of Food in the Last Year: Not on file  Transportation Needs:   . Lack of Transportation (Medical): Not on file  . Lack of Transportation (Non-Medical): Not on file  Physical Activity:   . Days of Exercise per  Week: Not on file  . Minutes of Exercise per Session: Not on file  Stress:   . Feeling of Stress : Not on file  Social Connections:   . Frequency of Communication with Friends and Family: Not on file  . Frequency of Social Gatherings with Friends and Family: Not on file  . Attends Religious Services: Not on file  . Active Member of Clubs or Organizations: Not on file  . Attends Archivist Meetings: Not on file  . Marital Status: Not on file    Allergies:  Allergies  Allergen Reactions  . Buspar [Buspirone]     spasms  . Influenza Vaccines Other (See Comments)    Very sick and was hospitalized   . Sulfa Antibiotics Swelling  . Topiramate Other (See Comments)    Mental fogginess, tingling, numbness    Metabolic Disorder Labs: No results found for: HGBA1C, MPG No results found for: PROLACTIN No results found for: CHOL, TRIG, HDL, CHOLHDL, VLDL, LDLCALC No results found for: TSH  Therapeutic Level Labs: No results found for: LITHIUM No results found for: VALPROATE No components found for:  CBMZ  Current Medications: Current Outpatient Medications  Medication Sig Dispense Refill  . acetaminophen (TYLENOL) 325 MG tablet Take by mouth.    Marland Kitchen aspirin-acetaminophen-caffeine (EXCEDRIN MIGRAINE) 250-250-65 MG tablet Take by mouth.    . baclofen (LIORESAL) 10 MG tablet TAKE 1 TABLET BY MOUTH 2 TIMES DAILY AS NEEDED  3  . cephALEXin (KEFLEX) 500 MG capsule cephalexin 500 mg capsule  TAKE 1 CAPSULE BY MOUTH 4 TIMES A DAY (Patient not taking: Reported on 10/01/2020)    . eszopiclone (LUNESTA) 2 MG TABS tablet Take 1 tablet (2 mg total) by mouth at bedtime as needed. 30 tablet 3  . fluconazole (DIFLUCAN) 150 MG tablet Take 150 mg by mouth every 3 (three) days.    . fluticasone (FLONASE) 50 MCG/ACT nasal spray Place 2 sprays into both nostrils daily.  12  . gabapentin (NEURONTIN) 300 MG capsule   1  . hydrOXYzine (ATARAX/VISTARIL) 25 MG tablet Take 0.5-1 tablets (12.5-25 mg  total) by mouth 2 (two) times daily as needed. For severe anxiety attacks only 60 tablet 1  . ibuprofen (ADVIL,MOTRIN) 600 MG tablet TAKE 1 TABLET BY MOUTH THREE TIMES DAILY WITH A MEAL FOR 7 DAYS  0  . magnesium oxide (MAG-OX) 400 MG tablet Take by mouth.    . medroxyPROGESTERone (DEPO-PROVERA) 150 MG/ML injection Inject 150 mg into the muscle every 3 (three) months. Last injection June 2015    . medroxyPROGESTERone Acetate 150 MG/ML SUSY INJECT 1 ML INTO THE MUSCLE EVERY 3 MONTHS (Patient not taking: Reported on 10/01/2020)    . meloxicam (MOBIC) 15 MG tablet TAKE 1 TABLET BY MOUTH EVERY DAY (Patient not taking: Reported on 04/30/2020) 30 tablet 3  . methylPREDNISolone (MEDROL DOSEPAK) 4 MG TBPK  tablet Use as directed (Patient not taking: Reported on 04/30/2020) 30 tablet 1  . naproxen (NAPROSYN) 500 MG tablet Take 500 mg by mouth 2 (two) times daily.    Marland Kitchen omeprazole (PRILOSEC) 40 MG capsule Take 40 mg by mouth daily.    . pregabalin (LYRICA) 75 MG capsule Take by mouth. (Patient not taking: Reported on 10/01/2020)    . pregabalin (LYRICA) 75 MG capsule Take 1 capsule by mouth twice daily    . promethazine (PHENERGAN) 25 MG tablet promethazine 25 mg tablet  TAKE 1 TABLET BY MOUTH EVERY 6 HOURS AS NEEDED FOR NAUSEA (Patient not taking: Reported on 10/01/2020)    . rizatriptan (MAXALT-MLT) 10 MG disintegrating tablet     . sertraline (ZOLOFT) 100 MG tablet Take 2 tablets (200 mg total) by mouth daily. 180 tablet 0  . SUMAtriptan (IMITREX) 20 MG/ACT nasal spray 1 SPRAY INTO LEFT NOSTRIL ONCE AS NEEDED FOR MIGRAINE. MAY REPEAT AFTER 2 HOURS IF NEEDED.  6  . triamterene-hydrochlorothiazide (MAXZIDE-25) 37.5-25 MG tablet Take 0.5 tablets by mouth daily.  6   No current facility-administered medications for this visit.     Musculoskeletal: Strength & Muscle Tone: UTA Gait & Station: UTA Patient leans: N/A  Psychiatric Specialty Exam: Review of Systems  Psychiatric/Behavioral: Negative for  agitation, behavioral problems, confusion, decreased concentration, dysphoric mood, hallucinations, self-injury, sleep disturbance and suicidal ideas. The patient is not nervous/anxious and is not hyperactive.   All other systems reviewed and are negative.   There were no vitals taken for this visit.There is no height or weight on file to calculate BMI.  General Appearance: Casual  Eye Contact:  Fair  Speech:  Normal Rate  Volume:  Normal  Mood:  Euthymic  Affect:  Congruent  Thought Process:  Goal Directed and Descriptions of Associations: Intact  Orientation:  Full (Time, Place, and Person)  Thought Content: Logical   Suicidal Thoughts:  No  Homicidal Thoughts:  No  Memory:  Immediate;   Fair Recent;   Fair Remote;   Fair  Judgement:  Fair  Insight:  Fair  Psychomotor Activity:  Normal  Concentration:  Concentration: Fair and Attention Span: Fair  Recall:  [employer] of Knowledge: Fair  Language: Fair  Akathisia:  No  Handed:  Right  AIMS (if indicated): UTA  Assets:  Communication Skills Desire for Improvement Housing Social Support  ADL's:  Intact  Cognition: WNL  Sleep:  Fair   Screenings: GAD-7     Video Visit from 04/30/2020 in [facility]  Total GAD-7 Score 11    PHQ2-9     Nutrition from 05/07/2017 in [facility]  PHQ-2 Total Score 0       Assessment and Plan: DASHAY GIESLER is a 34 year old Caucasian female, single, employed, has a history of MDD, GAD was evaluated by telemedicine today.  Patient with psychosocial stressors of health issues, current pandemic, is currently stable on medications.  Plan as noted below.  Plan MDD in remission Zoloft 200 mg p.o. daily Continue CBT with Ms. [name] as needed  GAD-stable Zoloft 200 mg p.o. daily Hydroxyzine 12.5 to 25 mg p.o. twice daily as needed for severe anxiety attacks  Insomnia due to medical condition-stable Continue CPAP for OSA Lunesta 1 mg  p.o. nightly as needed  Follow-up in clinic in 2 to 3 months or sooner if needed.  I have spent atleast 20 minutes face to face by video with patient today. More than 50 %  of the time was spent for preparing to see the patient ( e.g., review of test, records ), ordering medications and test ,psychoeducation and supportive psychotherapy and care coordination,as well as documenting clinical information in electronic health record. This note was generated in part or whole with voice recognition software. Voice recognition is usually quite accurate but there are transcription errors that can and very often do occur. I apologize for any typographical errors that were not detected and corrected.      Ursula Alert, MD 11/20/2020, 8:58 AM

## 2020-12-03 DIAGNOSIS — I890 Lymphedema, not elsewhere classified: Secondary | ICD-10-CM | POA: Insufficient documentation

## 2021-01-08 ENCOUNTER — Telehealth (INDEPENDENT_AMBULATORY_CARE_PROVIDER_SITE_OTHER): Payer: 59 | Admitting: Psychiatry

## 2021-01-08 ENCOUNTER — Other Ambulatory Visit: Payer: Self-pay

## 2021-01-08 ENCOUNTER — Encounter: Payer: Self-pay | Admitting: Psychiatry

## 2021-01-08 DIAGNOSIS — G4701 Insomnia due to medical condition: Secondary | ICD-10-CM

## 2021-01-08 DIAGNOSIS — F3342 Major depressive disorder, recurrent, in full remission: Secondary | ICD-10-CM | POA: Diagnosis not present

## 2021-01-08 DIAGNOSIS — F411 Generalized anxiety disorder: Secondary | ICD-10-CM | POA: Diagnosis not present

## 2021-01-08 NOTE — Progress Notes (Signed)
Virtual Visit via Video Note  I connected with Evelyn Flores on 01/08/21 at  8:30 AM EST by a video enabled telemedicine application and verified that I am speaking with the correct person using two identifiers.  Location Provider Location : [facility] Patient Location : Home  Participants: Patient , Provider    I discussed the limitations of evaluation and management by telemedicine and the availability of in person appointments. The patient expressed understanding and agreed to proceed.   I discussed the assessment and treatment plan with the patient. The patient was provided an opportunity to ask questions and all were answered. The patient agreed with the plan and demonstrated an understanding of the instructions.   The patient was advised to call back or seek an in-person evaluation if the symptoms worsen or if the condition fails to improve as anticipated.  Floyd MD OP Progress Note  01/08/2021 1:27 PM Evelyn Flores  MRN:  [id number]  Chief Complaint:  Chief Complaint    Follow-up     HPI: Evelyn Flores is a 35 year old Caucasian female, single, lives at [facility], has a history of MDD, GAD, insomnia, is employed, was evaluated by telemedicine today.  Patient today reports her depressive symptoms are stable.  She however reports recently she had an episode of anxiety mostly situational related to her work.  She however reports she currently feels she is able to cope with that better than before.  She reports she needs the Lunesta to sleep.  The nights that she does not take Lunesta she has difficulty falling asleep.  She is compliant on her CPAP.  Patient denies any suicidality, homicidality or perceptual disturbances.  Patient denies side effects to medications.  Patient denies any other concerns today.  Visit Diagnosis:    ICD-10-CM   1. MDD (major depressive disorder), recurrent, in full remission (Evelyn Flores)  F33.42   2. GAD (generalized anxiety disorder)  F41.1   3.  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.01     Past Psychiatric History: I have reviewed past psychiatric history from my progress note on 03/18/2018  Past Medical History:  Past Medical History:  Diagnosis Date  . Anxiety   . Depression   . DVT (deep venous thrombosis) ([place])   . Ear infection     Past Surgical History:  Procedure Laterality Date  . CHOLECYSTECTOMY    . VEIN SURGERY Bilateral    vein surgery both legs    Family Psychiatric History: I have reviewed family psychiatric history from my progress note on 03/18/2018  Family History:  Family History  Problem Relation Age of Onset  . Panic disorder Mother   . Dementia Paternal Grandfather     Social History: Reviewed social history from my progress note on 03/18/2018 Social History   Socioeconomic History  . Marital status: Single    Spouse name: Not on file  . Number of children: 0  . Years of education: Not on file  . Highest education level: High school graduate  Occupational History  . Occupation: sport endeavors    Comment: fulltime  Tobacco Use  . Smoking status: Never Smoker  . Smokeless tobacco: Never Used  Vaping Use  . Vaping Use: Never used  Substance and Sexual Activity  . Alcohol use: No    Alcohol/week: 0.0 standard drinks  . Drug use: No  . Sexual activity: Yes    Birth control/protection: Injection, Condom  Other Topics Concern  . Not on file  Social History  Narrative  . Not on file   Social Determinants of Health   Financial Resource Strain: Not on file  Food Insecurity: Not on file  Transportation Needs: Not on file  Physical Activity: Not on file  Stress: Not on file  Social Connections: Not on file    Allergies:  Allergies  Allergen Reactions  . Buspar [Buspirone]     spasms  . Influenza Vaccines Other (See Comments)    Very sick and was hospitalized   . Sulfa Antibiotics Swelling  . Topiramate Other (See Comments)    Mental fogginess, tingling, numbness     Metabolic Disorder Labs: No results found for: HGBA1C, MPG No results found for: PROLACTIN No results found for: CHOL, TRIG, HDL, CHOLHDL, VLDL, LDLCALC No results found for: TSH  Therapeutic Level Labs: No results found for: LITHIUM No results found for: VALPROATE No components found for:  CBMZ  Current Medications: Current Outpatient Medications  Medication Sig Dispense Refill  . acetaminophen (TYLENOL) 325 MG tablet Take by mouth.    Marland Kitchen aspirin-acetaminophen-caffeine (EXCEDRIN MIGRAINE) 250-250-65 MG tablet Take by mouth.    . cyclobenzaprine (FLEXERIL) 5 MG tablet SMARTSIG:2 Tablet(s) By Mouth 1 to 3 Times Daily    . eszopiclone (LUNESTA) 2 MG TABS tablet Take 1 tablet (2 mg total) by mouth at bedtime as needed. 30 tablet 3  . fluticasone (FLONASE) 50 MCG/ACT nasal spray Place 2 sprays into both nostrils daily.  12  . ibuprofen (ADVIL,MOTRIN) 600 MG tablet TAKE 1 TABLET BY MOUTH THREE TIMES DAILY WITH A MEAL FOR 7 DAYS  0  . magnesium oxide (MAG-OX) 400 MG tablet Take by mouth.    . medroxyPROGESTERone (DEPO-PROVERA) 150 MG/ML injection Inject 150 mg into the muscle every 3 (three) months. Last injection June 2015    . naproxen (NAPROSYN) 500 MG tablet Take 500 mg by mouth 2 (two) times daily.    Marland Kitchen omeprazole (PRILOSEC) 40 MG capsule Take 40 mg by mouth daily.    . phentermine (ADIPEX-P) 37.5 MG tablet Take by mouth.    . pregabalin (LYRICA) 75 MG capsule Take 1 capsule by mouth twice daily    . rizatriptan (MAXALT-MLT) 10 MG disintegrating tablet     . sertraline (ZOLOFT) 100 MG tablet Take 2 tablets (200 mg total) by mouth daily. 180 tablet 0  . triamterene-hydrochlorothiazide (MAXZIDE-25) 37.5-25 MG tablet Take 0.5 tablets by mouth daily.  6   No current facility-administered medications for this visit.     Musculoskeletal: Strength & Muscle Tone: UTA Gait & Station: normal Patient leans: N/A  Psychiatric Specialty Exam: Review of Systems  Psychiatric/Behavioral:  The patient is nervous/anxious.   All other systems reviewed and are negative.   There were no vitals taken for this visit.There is no height or weight on file to calculate BMI.  General Appearance: Casual  Eye Contact:  Fair  Speech:  Clear and Coherent  Volume:  Normal  Mood:  Anxious  Affect:  Congruent  Thought Process:  Goal Directed and Descriptions of Associations: Intact  Orientation:  Full (Time, Place, and Person)  Thought Content: Logical   Suicidal Thoughts:  No  Homicidal Thoughts:  No  Memory:  Immediate;   Fair Recent;   Fair Remote;   Fair  Judgement:  Fair  Insight:  Fair  Psychomotor Activity:  Normal  Concentration:  Concentration: Fair and Attention Span: Fair  Recall:  [employer] of Knowledge: Fair  Language: Fair  Akathisia:  No  Handed:  Right  AIMS (if indicated): UTA  Assets:  Communication Skills Desire for Improvement Housing Social Support  ADL's:  Intact  Cognition: WNL  Sleep:  Fair   Screenings: GAD-7   Flowsheet Row Video Visit from 04/30/2020 in [place]  Total GAD-7 Score 11    PHQ2-9   [facility] Nutrition from 05/07/2017 in [place]  PHQ-2 Total Score 0       Assessment and Plan: SOUA LENK is a 35 year old Caucasian female, single, employed, has a history of MDD, GAD, was evaluated by telemedicine today.  Patient with psychosocial stressors of job related stressors, her own health problems, current pandemic.  Patient does have situational anxiety, although coping well, discussed the following plan.  Plan MDD in remission Zoloft 200 mg p.o. daily Continue CBT with Ms. [name] as needed  GAD- stable Zoloft 200 mg p.o. daily Hydroxyzine 12.5-25 mg p.o. twice daily as needed for severe anxiety attacks  Insomnia-stable on medication Continue CPAP for OSA Discussed with patient to start taking the Zoloft in the morning to see if that will help with her sleep  at night. Advised to start melatonin as needed. Discussed sleep hygiene techniques.  Follow-up in clinic in 2 to 3 months or sooner if needed.  I have spent atleast 20 minutes face to face by video with patient today. More than 50 % of the time was spent for preparing to see the patient ( e.g., review of test, records ),  ordering medications and test ,psychoeducation and supportive psychotherapy and care coordination,as well as documenting clinical information in electronic health record. This note was generated in part or whole with voice recognition software. Voice recognition is usually quite accurate but there are transcription errors that can and very often do occur. I apologize for any typographical errors that were not detected and corrected.        Ursula Alert, MD 01/08/2021, 1:27 PM

## 2021-03-02 ENCOUNTER — Other Ambulatory Visit: Payer: Self-pay | Admitting: Psychiatry

## 2021-03-02 DIAGNOSIS — F411 Generalized anxiety disorder: Secondary | ICD-10-CM

## 2021-03-21 ENCOUNTER — Telehealth (INDEPENDENT_AMBULATORY_CARE_PROVIDER_SITE_OTHER): Payer: 59 | Admitting: Psychiatry

## 2021-03-21 ENCOUNTER — Encounter: Payer: Self-pay | Admitting: Psychiatry

## 2021-03-21 ENCOUNTER — Other Ambulatory Visit: Payer: Self-pay

## 2021-03-21 DIAGNOSIS — F411 Generalized anxiety disorder: Secondary | ICD-10-CM | POA: Diagnosis not present

## 2021-03-21 DIAGNOSIS — G4701 Insomnia due to medical condition: Secondary | ICD-10-CM | POA: Diagnosis not present

## 2021-03-21 DIAGNOSIS — F3342 Major depressive disorder, recurrent, in full remission: Secondary | ICD-10-CM | POA: Diagnosis not present

## 2021-03-21 NOTE — Progress Notes (Signed)
Virtual Visit via Video Note  I connected with Evelyn Flores on 03/21/21 at  8:30 AM EDT by a video enabled telemedicine application and verified that I am speaking with the correct person using two identifiers.  Location Provider Location : [facility] Patient Location : Home  Participants: Patient , Provider    I discussed the limitations of evaluation and management by telemedicine and the availability of in person appointments. The patient expressed understanding and agreed to proceed.    I discussed the assessment and treatment plan with the patient. The patient was provided an opportunity to ask questions and all were answered. The patient agreed with the plan and demonstrated an understanding of the instructions.   The patient was advised to call back or seek an in-person evaluation if the symptoms worsen or if the condition fails to improve as anticipated.   [facility] MD OP Progress Note  03/21/2021 8:56 AM Evelyn Flores  MRN:  [id number]  Chief Complaint:  Chief Complaint    Follow-up; Insomnia; Anxiety; Depression     HPI: Evelyn Flores is a 35 year old Caucasian female, single, lives at [facility], has a history of MDD, GAD, insomnia is employed, was evaluated by telemedicine today.  Patient today reports she continues to struggle with sleep.  She reports she has been taking Lunesta 1 mg, half tablet.  She reports she has no difficulty falling asleep however she wakes up around 3 or 4 AM and cannot fall back asleep for 2 hours.  She takes the [place] already 3 times a week since she does not want to take anything every day.  The nights that she does not take the Lunesta she takes the melatonin.  The melatonin takes longer to kick in so it takes her longer to fall asleep.  However just like the Lunesta she wakes up in the middle of the night even when she takes the melatonin.  She reports she has been trying to look at changes that she needs to make with her sleeping habits including  changing her mattress if needed.  Patient does report psychosocial stressors of family problems however reports she is coping well.  She reports work is going well.  Patient denies any suicidality, homicidality or perceptual disturbances.  Patient denies any other concerns today.  Visit Diagnosis:    ICD-10-CM   1. MDD (major depressive disorder), recurrent, in full remission ([place])  F33.42   2. GAD (generalized anxiety disorder)  F41.1   3. 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.01     Past Psychiatric History: I have reviewed past psychiatric history from my progress note on 03/18/2018  Past Medical History:  Past Medical History:  Diagnosis Date  . Anxiety   . Depression   . DVT (deep venous thrombosis) ([place])   . Ear infection     Past Surgical History:  Procedure Laterality Date  . CHOLECYSTECTOMY    . VEIN SURGERY Bilateral    vein surgery both legs    Family Psychiatric History: I have reviewed family psychiatric history from my progress note on 03/18/2018  Family History:  Family History  Problem Relation Age of Onset  . Panic disorder Mother   . Dementia Paternal Grandfather     Social History: I have reviewed social history from my progress note on 03/18/2018 Social History   Socioeconomic History  . Marital status: Single    Spouse name: Not on file  . Number of children: 0  . Years of education:  Not on file  . Highest education level: High school graduate  Occupational History  . Occupation: sport endeavors    Comment: fulltime  Tobacco Use  . Smoking status: Never Smoker  . Smokeless tobacco: Never Used  Vaping Use  . Vaping Use: Never used  Substance and Sexual Activity  . Alcohol use: No    Alcohol/week: 0.0 standard drinks  . Drug use: No  . Sexual activity: Yes    Birth control/protection: Injection, Condom  Other Topics Concern  . Not on file  Social History Narrative  . Not on file   Social Determinants of Health   Financial  Resource Strain: Not on file  Food Insecurity: Not on file  Transportation Needs: Not on file  Physical Activity: Not on file  Stress: Not on file  Social Connections: Not on file    Allergies:  Allergies  Allergen Reactions  . Buspar [Buspirone]     spasms  . Influenza Vaccines Other (See Comments)    Very sick and was hospitalized   . Sulfa Antibiotics Swelling  . Topiramate Other (See Comments)    Mental fogginess, tingling, numbness    Metabolic Disorder Labs: No results found for: HGBA1C, MPG No results found for: PROLACTIN No results found for: CHOL, TRIG, HDL, CHOLHDL, VLDL, LDLCALC No results found for: TSH  Therapeutic Level Labs: No results found for: LITHIUM No results found for: VALPROATE No components found for:  CBMZ  Current Medications: Current Outpatient Medications  Medication Sig Dispense Refill  . acetaminophen (TYLENOL) 325 MG tablet Take by mouth.    Marland Kitchen aspirin-acetaminophen-caffeine (EXCEDRIN MIGRAINE) 250-250-65 MG tablet Take by mouth.    . cyclobenzaprine (FLEXERIL) 5 MG tablet SMARTSIG:2 Tablet(s) By Mouth 1 to 3 Times Daily    . eszopiclone (LUNESTA) 2 MG TABS tablet Take 1 tablet (2 mg total) by mouth at bedtime as needed. 30 tablet 3  . fluconazole (DIFLUCAN) 150 MG tablet Take 150 mg by mouth once.    . fluticasone (FLONASE) 50 MCG/ACT nasal spray Place 2 sprays into both nostrils daily.  12  . ibuprofen (ADVIL,MOTRIN) 600 MG tablet TAKE 1 TABLET BY MOUTH THREE TIMES DAILY WITH A MEAL FOR 7 DAYS  0  . magnesium oxide (MAG-OX) 400 MG tablet Take by mouth.    . medroxyPROGESTERone (DEPO-PROVERA) 150 MG/ML injection Inject 150 mg into the muscle every 3 (three) months. Last injection June 2015    . medroxyPROGESTERone Acetate 150 MG/ML SUSY Inject 1 mL into the muscle every 3 (three) months.    . naproxen (NAPROSYN) 500 MG tablet Take 500 mg by mouth 2 (two) times daily.    Marland Kitchen omeprazole (PRILOSEC) 40 MG capsule Take 40 mg by mouth daily.    .  phentermine (ADIPEX-P) 37.5 MG tablet Take by mouth.    . pregabalin (LYRICA) 75 MG capsule Take 1 capsule by mouth twice daily    . rizatriptan (MAXALT-MLT) 10 MG disintegrating tablet     . sertraline (ZOLOFT) 100 MG tablet TAKE TWO TABLETS BY MOUTH DAILY 180 tablet 0  . triamterene-hydrochlorothiazide (MAXZIDE-25) 37.5-25 MG tablet Take 0.5 tablets by mouth daily.  6   No current facility-administered medications for this visit.     Musculoskeletal: Strength & Muscle Tone: UTA Gait & Station: UTA Patient leans: N/A  Psychiatric Specialty Exam: Review of Systems  Psychiatric/Behavioral: Positive for sleep disturbance. Negative for suicidal ideas.  All other systems reviewed and are negative.   There were no vitals taken for this visit.There  is no height or weight on file to calculate BMI.  General Appearance: Casual  Eye Contact:  Fair  Speech:  Clear and Coherent  Volume:  Normal  Mood:  Euthymic  Affect:  Congruent  Thought Process:  Goal Directed and Descriptions of Associations: Intact  Orientation:  Full (Time, Place, and Person)  Thought Content: Logical   Suicidal Thoughts:  No  Homicidal Thoughts:  No  Memory:  Immediate;   Fair Recent;   Fair Remote;   Fair  Judgement:  Fair  Insight:  Fair  Psychomotor Activity:  Normal  Concentration:  Concentration: Fair and Attention Span: Fair  Recall:  [employer] of Knowledge: Fair  Language: Fair  Akathisia:  No  Handed:  Right  AIMS (if indicated): UTA  Assets:  Communication Skills Desire for Improvement Housing Social Support  ADL's:  Intact  Cognition: WNL  Sleep:  Poor   Screenings: GAD-7   Flowsheet Row Video Visit from 04/30/2020 in [place]  Total GAD-7 Score 11    PHQ2-9   Flowsheet Row Video Visit from 03/21/2021 in [place] Nutrition from 05/07/2017 in [place]  PHQ-2 Total Score 0 0    Flowsheet Row Video  Visit from 03/21/2021 in [place] No Risk       Assessment and Plan: Evelyn Flores is a 35 year old Caucasian female, single, employed, has a history of MDD, GAD was evaluated by telemedicine today.  Patient with psychosocial stressors of job related stresses, her own health problems, current pandemic.  Patient is currently struggling with sleep.  Plan as noted below.  Plan  MDD in remission PHQ 2 equals 0 Continue CBT with Ms. [name] as needed Zoloft 200 mg p.o. daily  GAD-stable Zoloft 200 mg p.o. daily Hydroxyzine 12.5-25 mg p.o. twice daily as needed for severe anxiety attacks  Insomnia-unstable Continue CPAP for OSA Discussed sleep hygiene techniques. Discussed to increase the dosage of Lunesta at 2 mg p.o. nightly if the 1 mg is not beneficial. Advised she could otherwise compliant Lunesta 1 mg p.o. nightly with melatonin to see if that helps.  Follow-up in clinic in 3 to 4 weeks or sooner if needed.  This note was generated in part or whole with voice recognition software. Voice recognition is usually quite accurate but there are transcription errors that can and very often do occur. I apologize for any typographical errors that were not detected and corrected.       Ursula Alert, MD 03/21/2021, 8:56 AM

## 2021-04-19 ENCOUNTER — Telehealth (INDEPENDENT_AMBULATORY_CARE_PROVIDER_SITE_OTHER): Payer: 59 | Admitting: Psychiatry

## 2021-04-19 ENCOUNTER — Other Ambulatory Visit: Payer: Self-pay

## 2021-04-19 ENCOUNTER — Encounter: Payer: Self-pay | Admitting: Psychiatry

## 2021-04-19 DIAGNOSIS — F3342 Major depressive disorder, recurrent, in full remission: Secondary | ICD-10-CM | POA: Diagnosis not present

## 2021-04-19 DIAGNOSIS — G4701 Insomnia due to medical condition: Secondary | ICD-10-CM

## 2021-04-19 DIAGNOSIS — F411 Generalized anxiety disorder: Secondary | ICD-10-CM | POA: Diagnosis not present

## 2021-04-19 NOTE — Progress Notes (Signed)
Virtual Visit via Video Note  I connected with Evelyn Flores on 04/19/21 at  9:00 AM EDT by a video enabled telemedicine application and verified that I am speaking with the correct person using two identifiers.  Location Provider Location : [facility] Patient Location : Home  Participants: Patient , Provider   I discussed the limitations of evaluation and management by telemedicine and the availability of in person appointments. The patient expressed understanding and agreed to proceed.   I discussed the assessment and treatment plan with the patient. The patient was provided an opportunity to ask questions and all were answered. The patient agreed with the plan and demonstrated an understanding of the instructions.   The patient was advised to call back or seek an in-person evaluation if the symptoms worsen or if the condition fails to improve as anticipated.  [facility] MD OP Progress Note  04/19/2021 9:16 AM AHJANAE CASSEL  MRN:  [id number]  Chief Complaint:  Chief Complaint    Follow-up; Anxiety     HPI: MILADY FLEENER is a 35 year old Caucasian female, single, lives at [facility], has a history of MDD, GAD, insomnia, is employed, was evaluated by telemedicine today.  Patient today reports her depression continues to be stable.  She reports she does have anxiety at work however she has been coping okay and does not bother her much.  Patient reports she is no longer taking Lunesta for her sleep.  She is compliant with her CPAP mask.  She reports ever since doing this her sleep has improved.  She does not feel tired or groggy during the day anymore.  She continues to work on certain aspects of her sleep like keeping the temperature inside the room and on her mattress appropriate.  She is currently doing some research to get a covering for her mattress which stabilizes and controls temperature changes.  She already was able to get a pillow which does that and that really helps.  Reports she  has started spending time drawing, doing some arts and crafts to relax her.  She denies any suicidality, homicidality or perceptual disturbances.  Patient denies any other concerns today.  Visit Diagnosis:    ICD-10-CM   1. MDD (major depressive disorder), recurrent, in full remission ([place])  F33.42   2. GAD (generalized anxiety disorder)  F41.1   3. 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.01     Past Psychiatric History: Reviewed past psychiatric history from progress note on 03/18/2018.  Past Medical History:  Past Medical History:  Diagnosis Date  . Anxiety   . Depression   . DVT (deep venous thrombosis) ([place])   . Ear infection     Past Surgical History:  Procedure Laterality Date  . CHOLECYSTECTOMY    . VEIN SURGERY Bilateral    vein surgery both legs    Family Psychiatric History: Reviewed family psychiatric history from progress note on 03/18/2018.  Family History:  Family History  Problem Relation Age of Onset  . Panic disorder Mother   . Dementia Paternal Grandfather     Social History: Reviewed social history from progress note on 03/18/2018. Social History   Socioeconomic History  . Marital status: Single    Spouse name: Not on file  . Number of children: 0  . Years of education: Not on file  . Highest education level: High school graduate  Occupational History  . Occupation: sport [employer]    Comment: fulltime  Tobacco Use  . Smoking status: Never  Smoker  . Smokeless tobacco: Never Used  Vaping Use  . Vaping Use: Never used  Substance and Sexual Activity  . Alcohol use: No    Alcohol/week: 0.0 standard drinks  . Drug use: No  . Sexual activity: Yes    Birth control/protection: Injection, Condom  Other Topics Concern  . Not on file  Social History Narrative  . Not on file   Social Determinants of Health   Financial Resource Strain: Not on file  Food Insecurity: Not on file  Transportation Needs: Not on file  Physical Activity: Not on file   Stress: Not on file  Social Connections: Not on file    Allergies:  Allergies  Allergen Reactions  . Buspar [Buspirone]     spasms  . Influenza Vaccines Other (See Comments)    Very sick and was hospitalized   . Sulfa Antibiotics Swelling  . Topiramate Other (See Comments)    Mental fogginess, tingling, numbness    Metabolic Disorder Labs: No results found for: HGBA1C, MPG No results found for: PROLACTIN No results found for: CHOL, TRIG, HDL, CHOLHDL, VLDL, LDLCALC No results found for: TSH  Therapeutic Level Labs: No results found for: LITHIUM No results found for: VALPROATE No components found for:  CBMZ  Current Medications: Current Outpatient Medications  Medication Sig Dispense Refill  . acetaminophen (TYLENOL) 325 MG tablet Take by mouth.    Marland Kitchen aspirin-acetaminophen-caffeine (EXCEDRIN MIGRAINE) 250-250-65 MG tablet Take by mouth.    . cyclobenzaprine (FLEXERIL) 5 MG tablet SMARTSIG:2 Tablet(s) By Mouth 1 to 3 Times Daily    . eszopiclone (LUNESTA) 2 MG TABS tablet Take 1 tablet (2 mg total) by mouth at bedtime as needed. 30 tablet 3  . fluconazole (DIFLUCAN) 150 MG tablet Take 150 mg by mouth once.    . fluticasone (FLONASE) 50 MCG/ACT nasal spray Place 2 sprays into both nostrils daily.  12  . ibuprofen (ADVIL,MOTRIN) 600 MG tablet TAKE 1 TABLET BY MOUTH THREE TIMES DAILY WITH A MEAL FOR 7 DAYS  0  . magnesium oxide (MAG-OX) 400 MG tablet Take by mouth.    . medroxyPROGESTERone (DEPO-PROVERA) 150 MG/ML injection Inject 150 mg into the muscle every 3 (three) months. Last injection June 2015    . medroxyPROGESTERone Acetate 150 MG/ML SUSY Inject 1 mL into the muscle every 3 (three) months.    . naproxen (NAPROSYN) 500 MG tablet Take 500 mg by mouth 2 (two) times daily.    Marland Kitchen omeprazole (PRILOSEC) 40 MG capsule Take 40 mg by mouth daily.    . phentermine (ADIPEX-P) 37.5 MG tablet Take by mouth.    . pregabalin (LYRICA) 75 MG capsule Take 1 capsule by mouth twice daily     . rizatriptan (MAXALT-MLT) 10 MG disintegrating tablet     . sertraline (ZOLOFT) 100 MG tablet TAKE TWO TABLETS BY MOUTH DAILY 180 tablet 0  . triamterene-hydrochlorothiazide (MAXZIDE-25) 37.5-25 MG tablet Take 0.5 tablets by mouth daily.  6   No current facility-administered medications for this visit.     Musculoskeletal: Strength & Muscle Tone: UTA Gait & Station: UTA Patient leans: N/A  Psychiatric Specialty Exam: Review of Systems  Psychiatric/Behavioral: The patient is nervous/anxious.   All other systems reviewed and are negative.   There were no vitals taken for this visit.There is no height or weight on file to calculate BMI.  General Appearance: Casual  Eye Contact:  Fair  Speech:  Normal Rate  Volume:  Normal  Mood:  Anxious  Affect:  Appropriate  Thought Process:  Goal Directed and Descriptions of Associations: Intact  Orientation:  Full (Time, Place, and Person)  Thought Content: Logical   Suicidal Thoughts:  No  Homicidal Thoughts:  No  Memory:  Immediate;   Fair Recent;   Fair Remote;   Fair  Judgement:  Fair  Insight:  Fair  Psychomotor Activity:  Normal  Concentration:  Concentration: Fair and Attention Span: Fair  Recall:  [employer] of Knowledge: Fair  Language: Fair  Akathisia:  No  Handed:  Right  AIMS (if indicated): UTA  Assets:  Communication Skills [employer] Talents/Skills Transportation  ADL's:  Intact  Cognition: WNL  Sleep:  Improving   Screenings: GAD-7   Flowsheet Row Video Visit from 04/30/2020 in [place]  Total GAD-7 Score 11    PHQ2-9   Flowsheet Row Video Visit from 03/21/2021 in [facility] from 05/07/2017 in [place]  PHQ-2 Total Score 0 0    Flowsheet Row Video Visit from 03/21/2021 in [facility] No Risk       Assessment and Plan:  MANJU KULKARNI is a 35 year old Caucasian female, single, unemployed, has a history of MDD, GAD was evaluated by telemedicine today.  Patient with psychosocial stressors of job related stresses, her own health problems, current pandemic.  Patient is currently making progress.  Plan as noted below.  Plan MDD in remission Zoloft 200 mg p.o. daily Continue CBT as needed.  GAD-stable Zoloft 200 mg p.o. daily  Insomnia-improving Continue CPAP for OSA She does have Lunesta available however she has not been taking it and reports she feels better without it.  Follow-up in clinic in 3 months or sooner if needed.  This note was generated in part or whole with voice recognition software. Voice recognition is usually quite accurate but there are transcription errors that can and very often do occur. I apologize for any typographical errors that were not detected and corrected.        Ursula Alert, MD 04/19/2021, 9:16 AM

## 2021-06-10 ENCOUNTER — Telehealth: Payer: Self-pay

## 2021-06-10 DIAGNOSIS — F411 Generalized anxiety disorder: Secondary | ICD-10-CM

## 2021-06-10 MED ORDER — SERTRALINE HCL 100 MG PO TABS: 2.0000 | ORAL_TABLET | Freq: Every day | ORAL | 0 refills | Status: DC

## 2021-06-10 NOTE — Telephone Encounter (Signed)
received fax requesting a refill on the sertraline hcl 100 mg

## 2021-06-10 NOTE — Telephone Encounter (Signed)
I have sent sertraline to pharmacy. 

## 2021-07-03 ENCOUNTER — Other Ambulatory Visit: Payer: Self-pay

## 2021-07-03 ENCOUNTER — Encounter: Payer: Self-pay | Admitting: Emergency Medicine

## 2021-07-03 ENCOUNTER — Emergency Department
Admission: EM | Admit: 2021-07-03 | Discharge: 2021-07-03 | Disposition: A | Discharge status: Home / Self Care | Payer: Managed Care, Other (non HMO) | Attending: Emergency Medicine | Admitting: Emergency Medicine

## 2021-07-03 ENCOUNTER — Emergency Department: Payer: Managed Care, Other (non HMO)

## 2021-07-03 DIAGNOSIS — I10 Essential (primary) hypertension: Secondary | ICD-10-CM | POA: Diagnosis not present

## 2021-07-03 DIAGNOSIS — R42 Dizziness and giddiness: Secondary | ICD-10-CM | POA: Insufficient documentation

## 2021-07-03 DIAGNOSIS — Z7982 Long term (current) use of aspirin: Secondary | ICD-10-CM | POA: Insufficient documentation

## 2021-07-03 DIAGNOSIS — Z79899 Other long term (current) drug therapy: Secondary | ICD-10-CM | POA: Insufficient documentation

## 2021-07-03 DIAGNOSIS — Z85828 Personal history of other malignant neoplasm of skin: Secondary | ICD-10-CM | POA: Insufficient documentation

## 2021-07-03 DIAGNOSIS — M79604 Pain in right leg: Secondary | ICD-10-CM | POA: Insufficient documentation

## 2021-07-03 DIAGNOSIS — R110 Nausea: Secondary | ICD-10-CM | POA: Diagnosis not present

## 2021-07-03 MED ORDER — METHOCARBAMOL 500 MG PO TABS: 500.0000 mg | ORAL_TABLET | Freq: Four times a day (QID) | ORAL | 0 refills | Status: DC

## 2021-07-03 MED ORDER — MELOXICAM 15 MG PO TABS: 15.0000 mg | ORAL_TABLET | Freq: Every day | ORAL | 0 refills | Status: DC

## 2021-07-03 MED ORDER — ORPHENADRINE CITRATE 30 MG/ML IJ SOLN
60.0000 mg | Freq: Once | INTRAMUSCULAR | Status: AC
  Administered 2021-07-03: 60 mg via INTRAMUSCULAR
  Filled 2021-07-03: qty 2

## 2021-07-03 MED ORDER — KETOROLAC TROMETHAMINE 30 MG/ML IJ SOLN
30.0000 mg | Freq: Once | INTRAMUSCULAR | Status: AC
  Administered 2021-07-03: 30 mg via INTRAMUSCULAR
  Filled 2021-07-03: qty 1

## 2021-07-03 NOTE — ED Notes (Signed)
Pt states pain is a 8. Pt c/o pain behind knee on R leg.

## 2021-07-03 NOTE — ED Notes (Signed)
Patient reports pain to the back of the right knee. Patient denies injury. Patient reports hx of blood clot, and reports she is no longer on any blood thinners. Patient denies any swelling.

## 2021-07-03 NOTE — ED Provider Notes (Signed)
[facility] Emergency Department Provider Note  ____________________________________________  Time seen: Approximately 3:48 PM  I have reviewed the triage vital signs and the nursing notes.   HISTORY  Chief Complaint Leg Pain and Back Pain    HPI Evelyn Flores is a 35 y.o. female who presents the emergency department complaining of pain running from the back to the right knee.  Patient does have a history of DVT on the right side.  She is no longer on anticoagulation.  She states that she also has a known meniscal tear to the right knee.  She denied any new or specific injury to the right knee.  He states that has been progressively worsening and today when she was trying to stand up she felt a wave of lightheadedness and nausea when standing on the knee itself.  No loss of sensation.  No bowel or bladder dysfunction, saddle anesthesia or paresthesias.  No urinary or GI symptoms.  Patient has again a history of DVT to the right side.  She denied any gross edema to the leg.  No erythema is reported.  Patient is on Lyrica for "nerve pain" but denies a history of sciatica.       Past Medical History:  Diagnosis Date   Anxiety    Depression    DVT (deep venous thrombosis) ([place])    Ear infection     Patient Active Problem List   Diagnosis Date Noted   Lymphedema 12/03/2020   Plantar fasciitis, left 02/16/2020   Lymphedema of lower extremity 12/07/2019   Essential hypertension 11/24/2019   Uterine cramping 06/28/2019   MDD (major depressive disorder), recurrent, in full remission ([place]) 06/28/2019   Insomnia 06/28/2019   GAD (generalized anxiety disorder) 06/28/2019   Bereavement 06/28/2019   MDD (major depressive disorder), recurrent episode, mild ([place]) 03/18/2018   Varicose veins with pain 02/18/2017   Venous insufficiency 12/05/2015   Chronic deep vein thrombosis (DVT) of femoral vein of right lower extremity ([place]) 11/01/2015   Gastroesophageal reflux  disease without esophagitis 11/01/2015   Leg swelling 11/01/2015   PCOS (polycystic ovarian syndrome) 11/01/2015   Obesity, Class III, BMI 40-49.9 (morbid obesity) ([place]) 11/01/2015   Allergic rhinitis due to pollen 11/01/2015   Sensorineural hearing loss of both ears 11/01/2015   Tinnitus 11/01/2015   Abnormal EKG 03/08/2015   Chest pressure 03/08/2015   Swelling of left lower extremity 10/11/2014   Neoplasm of uncertain behavior of skin 09/19/2014   Abdominal pain 03/14/2014   Diarrhea, unspecified 03/14/2014   GERD (gastroesophageal reflux disease) 03/14/2014   Morbid obesity ([place]) 03/14/2014   Tibial tendonitis, posterior 08/24/2013   History of blood clots 07/01/2013   Paresthesias 12/29/2012   Meniere's disease 10/18/2012   Migraine 10/18/2012    Past Surgical History:  Procedure Laterality Date   CHOLECYSTECTOMY     VEIN SURGERY Bilateral    vein surgery both legs    Prior to Admission medications   Medication Sig Start Date End Date Taking? Authorizing Provider  meloxicam (MOBIC) 15 MG tablet Take 1 tablet (15 mg total) by mouth daily. 07/03/21  Yes Ocie Tino, Charline Bills, PA-C  methocarbamol (ROBAXIN) 500 MG tablet Take 1 tablet (500 mg total) by mouth 4 (four) times daily. 07/03/21  Yes Damoni Erker, Charline Bills, PA-C  acetaminophen (TYLENOL) 325 MG tablet Take by mouth.    [provider]  aspirin-acetaminophen-caffeine (EXCEDRIN MIGRAINE) [phone] MG tablet Take by mouth.    [provider]  cyclobenzaprine (FLEXERIL) 5 MG  tablet SMARTSIG:2 Tablet(s) By Mouth 1 to 3 Times Daily 12/25/20   [provider]  eszopiclone (LUNESTA) 2 MG TABS tablet Take 1 tablet (2 mg total) by mouth at bedtime as needed. 10/17/20   Ursula Alert, MD  fluconazole (DIFLUCAN) 150 MG tablet Take 150 mg by mouth once. 02/11/21   [provider]  fluticasone (FLONASE) 50 MCG/ACT nasal spray Place 2 sprays into both nostrils daily. 01/30/17   [provider]   ibuprofen (ADVIL,MOTRIN) 600 MG tablet TAKE 1 TABLET BY MOUTH THREE TIMES DAILY WITH A MEAL FOR 7 DAYS 04/09/18   [provider]  magnesium oxide (MAG-OX) 400 MG tablet Take by mouth.    [provider]  medroxyPROGESTERone (DEPO-PROVERA) 150 MG/ML injection Inject 150 mg into the muscle every 3 (three) months. Last injection June 2015    [provider]  medroxyPROGESTERone Acetate 150 MG/ML SUSY Inject 1 mL into the muscle every 3 (three) months. 02/08/21   [provider]  naproxen (NAPROSYN) 500 MG tablet Take 500 mg by mouth 2 (two) times daily. 08/20/20   [provider]  omeprazole (PRILOSEC) 40 MG capsule Take 40 mg by mouth daily.    [provider]  phentermine (ADIPEX-P) 37.5 MG tablet Take by mouth. 12/03/20   [provider]  pregabalin (LYRICA) 75 MG capsule Take 1 capsule by mouth twice daily 09/06/19   [provider]  rizatriptan (MAXALT-MLT) 10 MG disintegrating tablet  09/12/19   [provider]  sertraline (ZOLOFT) 100 MG tablet Take 2 tablets (200 mg total) by mouth daily. 06/10/21   Ursula Alert, MD  triamterene-hydrochlorothiazide (MAXZIDE-25) 37.5-25 MG tablet Take 0.5 tablets by mouth daily. 04/24/18   [provider]    Allergies Buspar [buspirone], Influenza vaccines, Sulfa antibiotics, and Topiramate  Family History  Problem Relation Age of Onset   Panic disorder Mother    Dementia Paternal Grandfather     Social History Social History   Tobacco Use   Smoking status: Never   Smokeless tobacco: Never  Vaping Use   Vaping Use: Never used  Substance Use Topics   Alcohol use: No    Alcohol/week: 0.0 standard drinks   Drug use: No     Review of Systems  Constitutional: No fever/chills Eyes: No visual changes. No discharge ENT: No upper respiratory complaints. Cardiovascular: no chest pain. Respiratory: no cough. No SOB. Gastrointestinal: No abdominal pain.  No  nausea, no vomiting.  No diarrhea.  No constipation. Genitourinary: Negative for dysuria. No hematuria Musculoskeletal: Pain running from the right back to the right knee Skin: Negative for rash, abrasions, lacerations, ecchymosis. Neurological: Negative for headaches, focal weakness or numbness.  10 System ROS otherwise negative.  ____________________________________________   PHYSICAL EXAM:  VITAL SIGNS: ED Triage Vitals  Enc Vitals Group     BP 07/03/21 1405 (!) 108/59     Pulse Rate 07/03/21 1405 76     Resp 07/03/21 1405 20     Temp 07/03/21 1405 98.2 F (36.8 C)     Temp Source 07/03/21 1405 Oral     SpO2 07/03/21 1405 98 %     Weight 07/03/21 1404 (!) 324 lb 1.2 oz (147 kg)     Height 07/03/21 1404 5\' 6"  (1.676 m)     Head Circumference --      Peak Flow --      Pain Score 07/03/21 1404 8     Pain Loc --      Pain Edu? --  Excl. in [place]? --      Constitutional: Alert and oriented. Well appearing and in no acute distress. Eyes: Conjunctivae are normal. PERRL. EOMI. Head: Atraumatic. ENT:      Ears:       Nose: No congestion/rhinnorhea.      Mouth/Throat: Mucous membranes are moist.  Neck: No stridor.    Cardiovascular: Normal rate, regular rhythm. Normal S1 and S2.  Good peripheral circulation. Respiratory: Normal respiratory effort without tachypnea or retractions. Lungs CTAB. Good air entry to the bases with no decreased or absent breath sounds. Gastrointestinal: Bowel sounds 4 quadrants. Soft and nontender to palpation. No guarding or rigidity. No palpable masses. No distention. No CVA tenderness. Musculoskeletal: Full range of motion to all extremities. No gross deformities appreciated.  Visualization of the Neurologic:  Normal speech and language. No gross focal neurologic deficits are appreciated.  Skin:  Skin is warm, dry and intact. No rash noted. Psychiatric: Mood and affect are normal. Speech and behavior are normal. Patient exhibits appropriate  insight and judgement.   ____________________________________________   LABS (all labs ordered are listed, but only abnormal results are displayed)  Labs Reviewed - No data to display ____________________________________________  EKG   ____________________________________________  RADIOLOGY I personally viewed and evaluated these images as part of my medical decision making, as well as reviewing the written report by the radiologist.  ED Provider Interpretation: No indication of DVT on ultrasound  US Venous Img Lower Unilateral Right  Result Date: 07/03/2021 CLINICAL DATA:  Leg pain History of DVT EXAM: RIGHT LOWER EXTREMITY VENOUS DOPPLER ULTRASOUND TECHNIQUE: Gray-scale sonography with compression, as well as color and duplex ultrasound, were performed to evaluate the deep venous system(s) from the level of the common femoral vein through the popliteal and proximal calf veins. COMPARISON:  08/28/2015 FINDINGS: VENOUS Normal compressibility of the common femoral, superficial femoral, and popliteal veins, as well as the visualized calf veins. Visualized portions of profunda femoral vein and great saphenous vein unremarkable. No filling defects to suggest DVT on grayscale or color Doppler imaging. Doppler waveforms show normal direction of venous flow, normal respiratory plasticity and response to augmentation. Limited views of the contralateral common femoral vein are unremarkable. OTHER None. Limitations: none IMPRESSION: No right lower extremity DVT Electronically Signed   By: Miachel Roux M.D.   On: 07/03/2021 15:03    ____________________________________________    PROCEDURES  Procedure(s) performed:    Procedures    Medications  ketorolac (TORADOL) 30 MG/ML injection 30 mg (30 mg Intramuscular Given 07/03/21 1627)  orphenadrine (NORFLEX) injection 60 mg (60 mg Intramuscular Given 07/03/21 1627)     ____________________________________________   INITIAL IMPRESSION /  ASSESSMENT AND PLAN / ED COURSE  Pertinent labs & imaging results that were available during my care of the patient were reviewed by me and considered in my medical decision making (see chart for details).  Review of the [place] CSRS was performed in accordance of the [facility] prior to dispensing any controlled drugs.           Patient's diagnosis is consistent with right leg pain.  Patient presented to the emergency department complaining of right leg pain radiating from the back to the posterior right knee.  She has a history of DVT but is no longer on anticoagulation.  Patient denies any trauma to the back or her knee.  She endorses a history of nerve pain but denied any history of sciatica.  Patient was tender along the posterior aspect of the knee  without significant other tenderness.  Differential included DVT, sciatica, worsening of the patient's known meniscal tear.  Ultrasound revealed no evidence of DVT.  At this time this may be component of sciatica as the pain radiates from the back to the knee versus worsening of her meniscal injury.  At this time we will treat with anti-inflammatory muscle relaxer for symptom control.  If this improves symptoms it was like it made her back no further treatment needed.  If patient's symptoms persist with pain specifically about the knee I recommend following up with orthopedics. Patient is given ED precautions to return to the ED for any worsening or new symptoms.     ____________________________________________  FINAL CLINICAL IMPRESSION(S) / ED DIAGNOSES  Final diagnoses:  Right leg pain      NEW MEDICATIONS STARTED DURING THIS VISIT:  ED Discharge Orders          Ordered    meloxicam (MOBIC) 15 MG tablet  Daily        07/03/21 1623    methocarbamol (ROBAXIN) 500 MG tablet  4 times daily        07/03/21 1623                This chart was dictated using voice recognition software/Dragon. Despite best efforts to proofread, errors  can occur which can change the meaning. Any change was purely unintentional.    Darletta Moll, PA-C 07/03/21 1701    Lucrezia Starch, MD 07/04/21 [phone]

## 2021-07-03 NOTE — ED Notes (Signed)
Per ED NT, patient needs larger hinged knee brace, which is not available at this time in the ED. Evelyn Flores, [place] notified. Patient to receive ace wrap to right knee for stabilization. Patient educated on the use of ace wrap, and importance of neoprene knee sleeve to be purchased at discharge. Patient verbalized understanding.

## 2021-07-03 NOTE — ED Triage Notes (Signed)
Pt comes into the ED via [facility] from home c/o right low back pain and into the right leg.  Pt states that she also has pain in the back of her right knee.  Pt has a h/o blood clots in the past and she she is no longer on blood thinning agents.  Pt presents with even and unlabored respirations at this time.

## 2021-07-03 NOTE — ED Notes (Signed)
ED Provider at bedside. 

## 2021-07-03 NOTE — ED Notes (Signed)
Patient updated on POC. Patient denies needs at this time. Patient alert, oriented x4. Patient provided discharge instructions, including follow up care, prescriptions, and home care. Patient verbalized understanding. Patient denies questions or concerns.

## 2021-07-19 ENCOUNTER — Other Ambulatory Visit: Payer: Self-pay

## 2021-07-19 ENCOUNTER — Encounter: Payer: Self-pay | Admitting: Psychiatry

## 2021-07-19 ENCOUNTER — Telehealth (INDEPENDENT_AMBULATORY_CARE_PROVIDER_SITE_OTHER): Payer: 59 | Admitting: Psychiatry

## 2021-07-19 DIAGNOSIS — F3342 Major depressive disorder, recurrent, in full remission: Secondary | ICD-10-CM

## 2021-07-19 DIAGNOSIS — F411 Generalized anxiety disorder: Secondary | ICD-10-CM | POA: Diagnosis not present

## 2021-07-19 DIAGNOSIS — G4701 Insomnia due to medical condition: Secondary | ICD-10-CM

## 2021-07-19 NOTE — Progress Notes (Signed)
Virtual Visit via Video Note  I connected with Evelyn Flores on 07/19/21 at  8:30 AM EDT by a video enabled telemedicine application and verified that I am speaking with the correct person using two identifiers.  Location Provider Location : [facility] Patient Location : Home  Participants: Patient , Provider    I discussed the limitations of evaluation and management by telemedicine and the availability of in person appointments. The patient expressed understanding and agreed to proceed.    I discussed the assessment and treatment plan with the patient. The patient was provided an opportunity to ask questions and all were answered. The patient agreed with the plan and demonstrated an understanding of the instructions.   The patient was advised to call back or seek an in-person evaluation if the symptoms worsen or if the condition fails to improve as anticipated.   [place] MD OP Progress Note  07/19/2021 9:08 AM Evelyn Flores  MRN:  [id number]  Chief Complaint:  Chief Complaint   Follow-up; Anxiety; Depression    HPI: SUSHMITA Flores is a 35 year old Caucasian female, single, lives at [facility], has a history of MDD, GAD, insomnia, is employed was evaluated by telemedicine today.  Patient today reports she is currently doing well.  She denies depressive symptoms.  Her anxiety symptoms are under control.  She reports sleep as improved.  She sleeps 8 hours or so at night.  She uses CPAP.  She does not use the Lunesta anymore.  She has not needed.  Reports her appetite is good.  Work is busy and going well.  Her pain is more manageable and she takes Lyrica.  She has neurological pain radiating down her legs.  Patient denies any suicidality, homicidality or perceptual disturbances.  She is compliant on the Zoloft.  Denies side effects.  Patient denies any other concerns today.  Visit Diagnosis:    ICD-10-CM   1. MDD (major depressive disorder), recurrent, in full remission ([place])   F33.42     2. GAD (generalized anxiety disorder)  F41.1     3. 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.01    mood, sleep apnea      Past Psychiatric History: Reviewed past psychiatric history from progress note on 03/18/2018  Past Medical History:  Past Medical History:  Diagnosis Date   Anxiety    Depression    DVT (deep venous thrombosis) (HCC)    Ear infection     Past Surgical History:  Procedure Laterality Date   CHOLECYSTECTOMY     VEIN SURGERY Bilateral    vein surgery both legs    Family Psychiatric History: Reviewed family psychiatric history from progress note on 03/18/2018  Family History:  Family History  Problem Relation Age of Onset   Panic disorder Mother    Dementia Paternal Grandfather     Social History: Reviewed social history from progress note on 03/18/2018 Social History   Socioeconomic History   Marital status: Single    Spouse name: Not on file   Number of children: 0   Years of education: Not on file   Highest education level: High school graduate  Occupational History   Occupation: [employer]    Comment: fulltime  Tobacco Use   Smoking status: Never   Smokeless tobacco: Never  Vaping Use   Vaping Use: Never used  Substance and Sexual Activity   Alcohol use: No    Alcohol/week: 0.0 standard drinks   Drug use: No   Sexual activity:  Yes    Birth control/protection: Injection, Condom  Other Topics Concern   Not on file  Social History Narrative   Not on file   Social Determinants of Health   Financial Resource Strain: Not on file  Food Insecurity: Not on file  Transportation Needs: Not on file  Physical Activity: Not on file  Stress: Not on file  Social Connections: Not on file    Allergies:  Allergies  Allergen Reactions   Buspar [Buspirone]     spasms   Influenza Vaccines Other (See Comments)    Very sick and was hospitalized    Sulfa Antibiotics Swelling   Topiramate Other (See Comments)    Mental fogginess,  tingling, numbness    Metabolic Disorder Labs: No results found for: HGBA1C, MPG No results found for: PROLACTIN No results found for: CHOL, TRIG, HDL, CHOLHDL, VLDL, LDLCALC No results found for: TSH  Therapeutic Level Labs: No results found for: LITHIUM No results found for: VALPROATE No components found for:  CBMZ  Current Medications: Current Outpatient Medications  Medication Sig Dispense Refill   pregabalin (LYRICA) 75 MG capsule Take by mouth.     acetaminophen (TYLENOL) 325 MG tablet Take by mouth.     aspirin-acetaminophen-caffeine (EXCEDRIN MIGRAINE) 250-250-65 MG tablet Take by mouth.     cyclobenzaprine (FLEXERIL) 5 MG tablet SMARTSIG:2 Tablet(s) By Mouth 1 to 3 Times Daily     eszopiclone (LUNESTA) 2 MG TABS tablet Take 1 tablet (2 mg total) by mouth at bedtime as needed. 30 tablet 3   fluconazole (DIFLUCAN) 150 MG tablet Take 150 mg by mouth once.     fluticasone (FLONASE) 50 MCG/ACT nasal spray Place 2 sprays into both nostrils daily.  12   ibuprofen (ADVIL,MOTRIN) 600 MG tablet TAKE 1 TABLET BY MOUTH THREE TIMES DAILY WITH A MEAL FOR 7 DAYS  0   magnesium oxide (MAG-OX) 400 MG tablet Take by mouth.     medroxyPROGESTERone (DEPO-PROVERA) 150 MG/ML injection Inject 150 mg into the muscle every 3 (three) months. Last injection June 2015     medroxyPROGESTERone Acetate 150 MG/ML SUSY Inject 1 mL into the muscle every 3 (three) months.     meloxicam (MOBIC) 15 MG tablet Take 1 tablet (15 mg total) by mouth daily. 30 tablet 0   methocarbamol (ROBAXIN) 500 MG tablet Take 1 tablet (500 mg total) by mouth 4 (four) times daily. 16 tablet 0   naproxen (NAPROSYN) 500 MG tablet Take 500 mg by mouth 2 (two) times daily.     omeprazole (PRILOSEC) 40 MG capsule Take 40 mg by mouth daily.     phentermine (ADIPEX-P) 37.5 MG tablet Take by mouth.     pregabalin (LYRICA) 75 MG capsule Take 1 capsule by mouth twice daily     rizatriptan (MAXALT-MLT) 10 MG disintegrating tablet       sertraline (ZOLOFT) 100 MG tablet Take 2 tablets (200 mg total) by mouth daily. 180 tablet 0   triamterene-hydrochlorothiazide (MAXZIDE-25) 37.5-25 MG tablet Take 0.5 tablets by mouth daily.  6   No current facility-administered medications for this visit.     Musculoskeletal: Strength & Muscle Tone:  UTA Gait & Station:  UTA Patient leans: N/A  Psychiatric Specialty Exam: Review of Systems  Musculoskeletal:        Legs BL pain  Psychiatric/Behavioral:  The patient is nervous/anxious.   All other systems reviewed and are negative.  There were no vitals taken for this visit.There is no height or weight on file  to calculate BMI.  General Appearance: Fairly Groomed  Eye Contact:  Fair  Speech:  Normal Rate  Volume:  Normal  Mood:  Anxious  Affect:  Congruent  Thought Process:  Goal Directed and Descriptions of Associations: Intact  Orientation:  Full (Time, Place, and Person)  Thought Content: Logical   Suicidal Thoughts:  No  Homicidal Thoughts:  No  Memory:  Immediate;   Fair Recent;   Fair Remote;   Fair  Judgement:  Fair  Insight:  Fair  Psychomotor Activity:  Normal  Concentration:  Concentration: Fair and Attention Span: Fair  Recall:  [employer] of Knowledge: Fair  Language: Fair  Akathisia:  No  Handed:  Right  AIMS (if indicated): not done  Assets:  Communication Skills Desire for [employer]/Skills Transportation  ADL's:  Intact  Cognition: WNL  Sleep:  Fair   Screenings: GAD-7    Flowsheet Row Video Visit from 07/19/2021 in [facility] Video Visit from 04/30/2020 in [facility]  Total GAD-7 Score 0 11      PHQ2-9    [place] Video Visit from 07/19/2021 in [facility] Video Visit from 03/21/2021 in [facility] [facility] from 05/07/2017 in [facility]  PHQ-2 Total Score 0 0 0   PHQ-9 Total Score 0 -- --      Flowsheet Row ED from 07/03/2021 in [facility] Video Visit from 03/21/2021 in [facility] No Risk No Risk        Assessment and Plan: KARELYN OAS is a 35 year old Caucasian female, single, employed, has a history of MDD, GAD was evaluated by telemedicine today.  Patient is currently making progress on the current medication regimen, although continues to struggle with pain reports pain is manageable.  Plan as noted below.  Plan MDD in remission Zoloft 200 mg p.o. daily Continue CBT as needed  GAD-stable Zoloft 200 mg p.o. daily  Insomnia-improving Continue CPAP for OSA Patient is no longer taking Lunesta although it is available.  Patient will continue to need sufficient pain management.  Follow-up in clinic in 3 months or sooner if needed.  This note was generated in part or whole with voice recognition software. Voice recognition is usually quite accurate but there are transcription errors that can and very often do occur. I apologize for any typographical errors that were not detected and corrected.     Ursula Alert, MD 07/19/2021, 9:08 AM

## 2021-08-11 IMAGING — US US EXTREM LOW VENOUS*R*
1 series · 14 of 24 positions shown · non-contrast
Comparison: 08/28/2015

CLINICAL DATA: Leg pain

History of DVT
EXAM:
RIGHT LOWER EXTREMITY VENOUS DOPPLER ULTRASOUND
TECHNIQUE: Gray-scale sonography with compression, as well as color and duplex
ultrasound, were performed to evaluate the deep venous system(s)
from the level of the common femoral vein through the popliteal and
proximal calf veins.

[Series 1: us venous img lower uni right (dvt) · portal-venous · 14 of 33 slices shown]
[im 1/33]
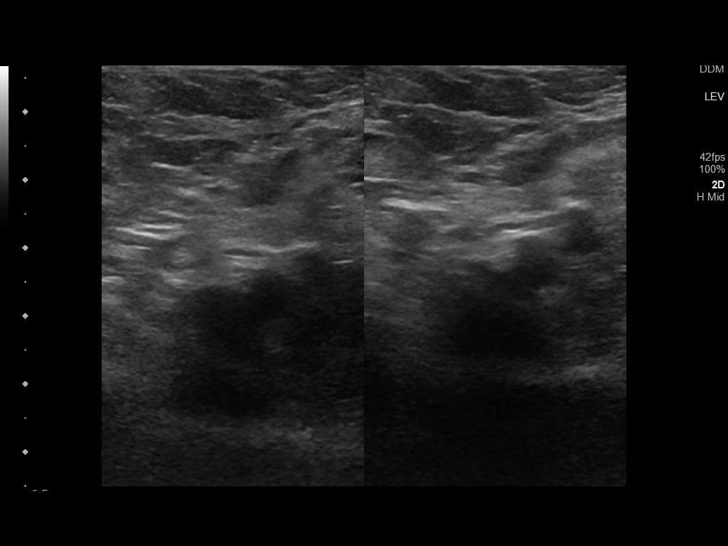
[im 3/33]
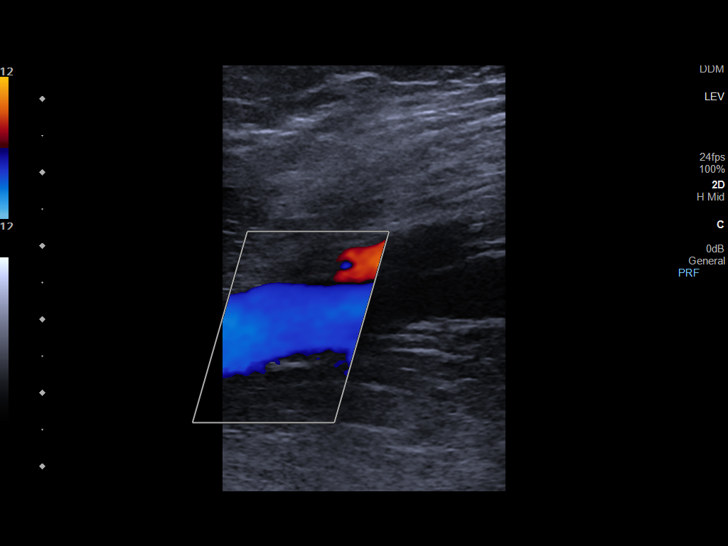
[im 6/33]
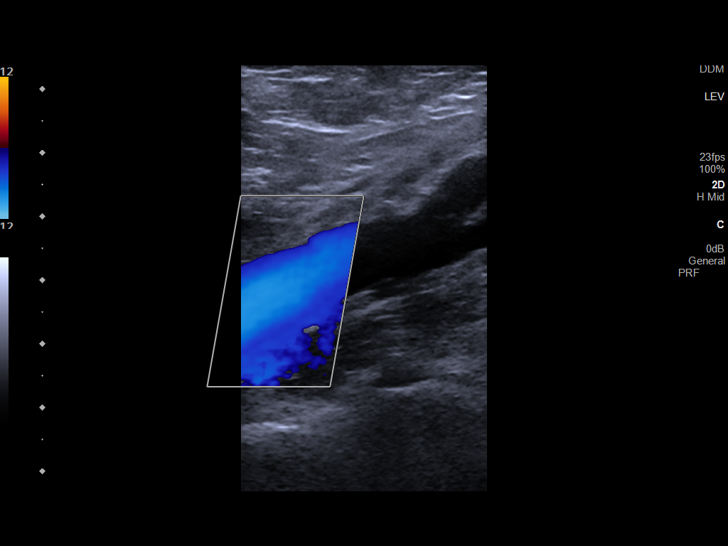
[im 9/33]
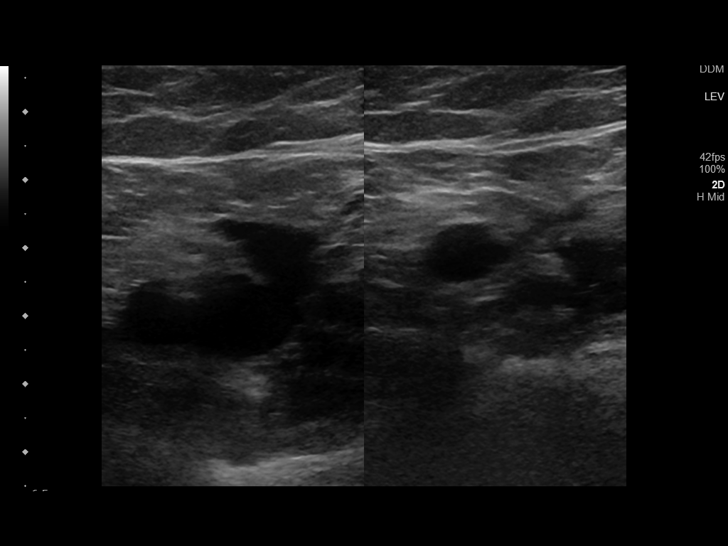
[im 10/33]
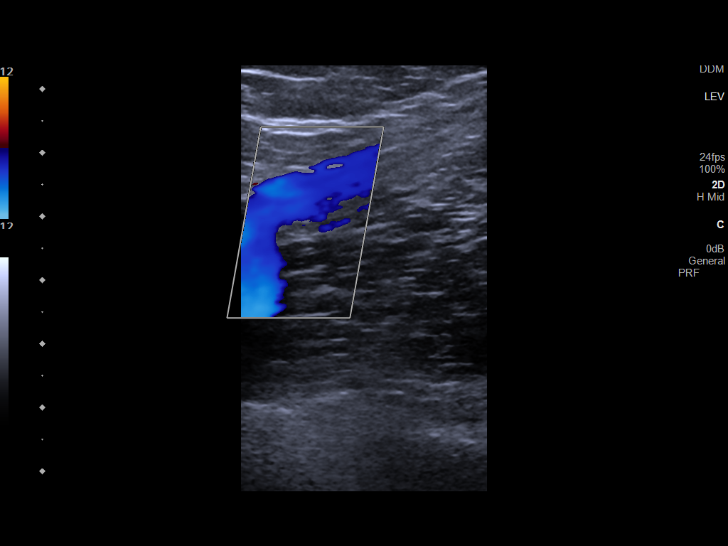
[im 13/33]
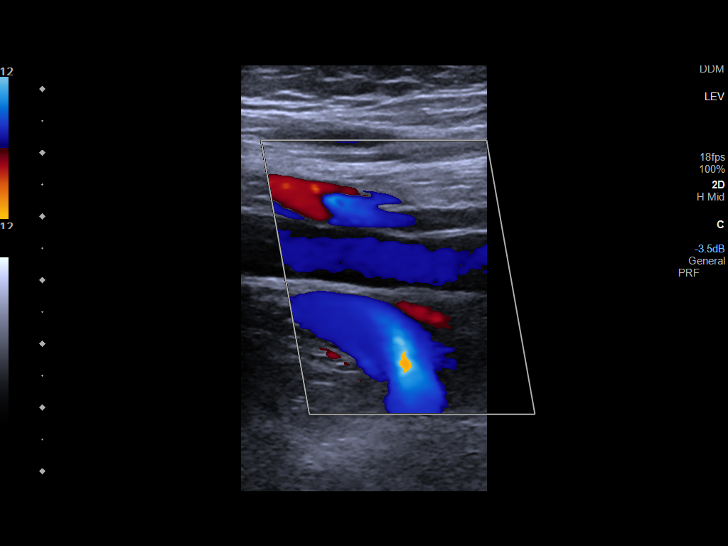
[im 16/33]
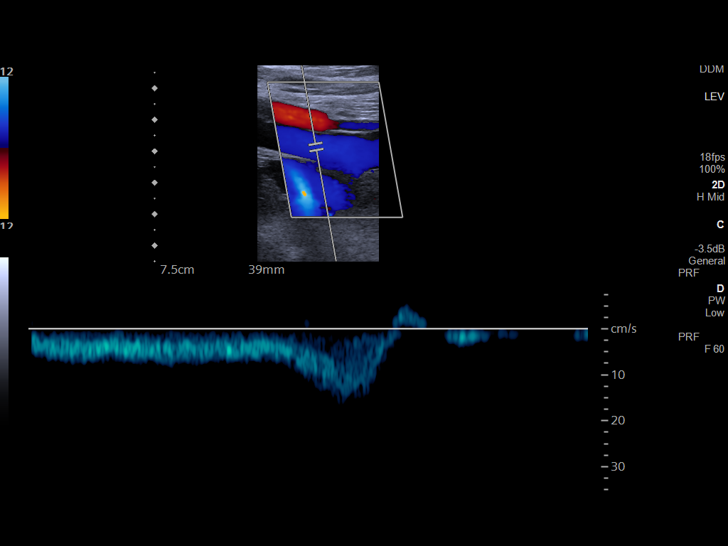
[im 17/33]
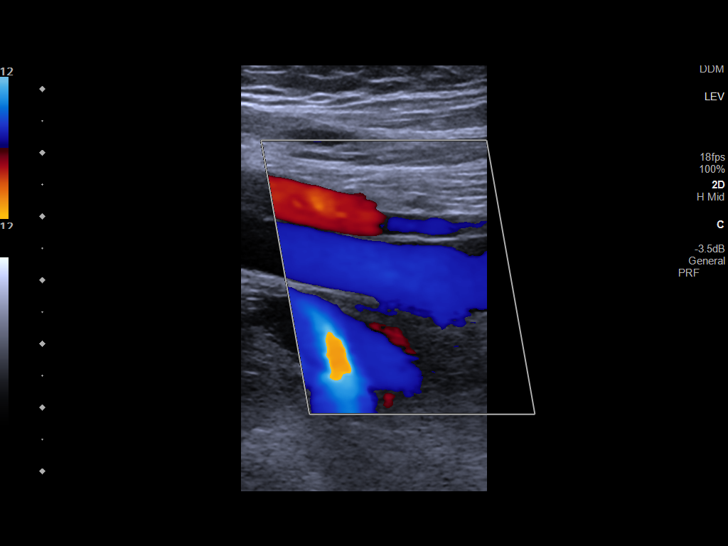
[im 20/33]
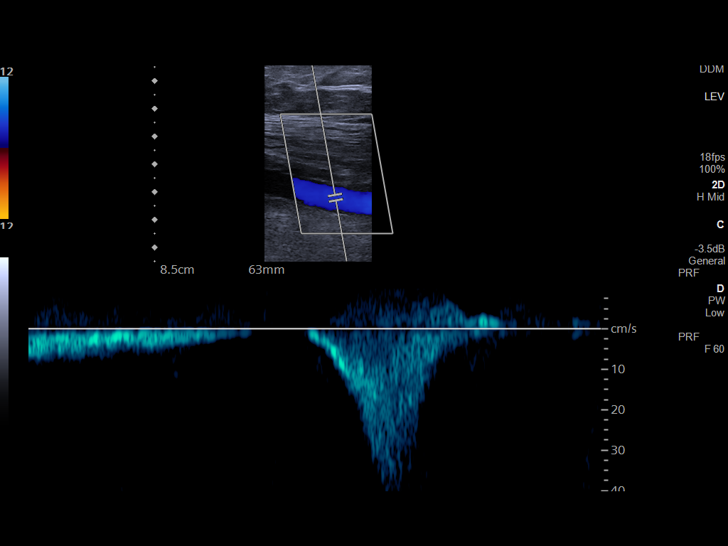
[im 23/33]
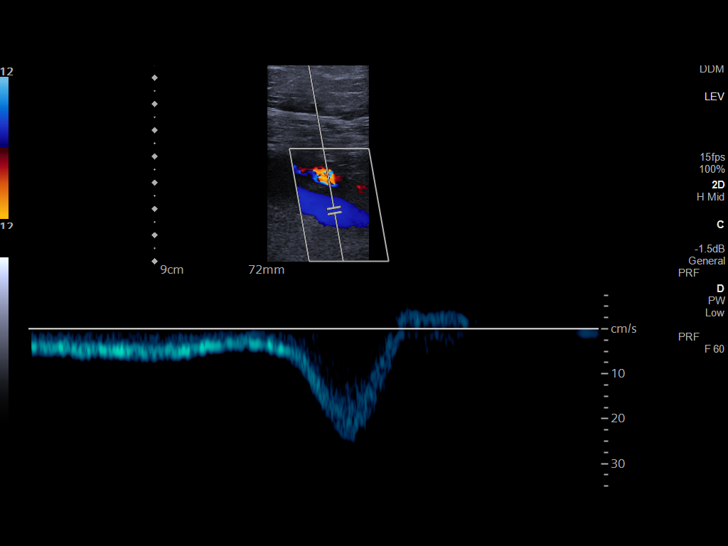
[im 26/33]
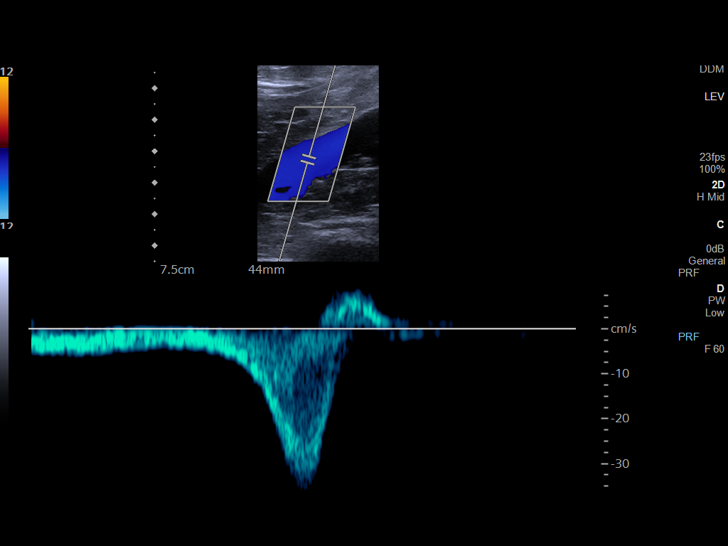
[im 27/33]
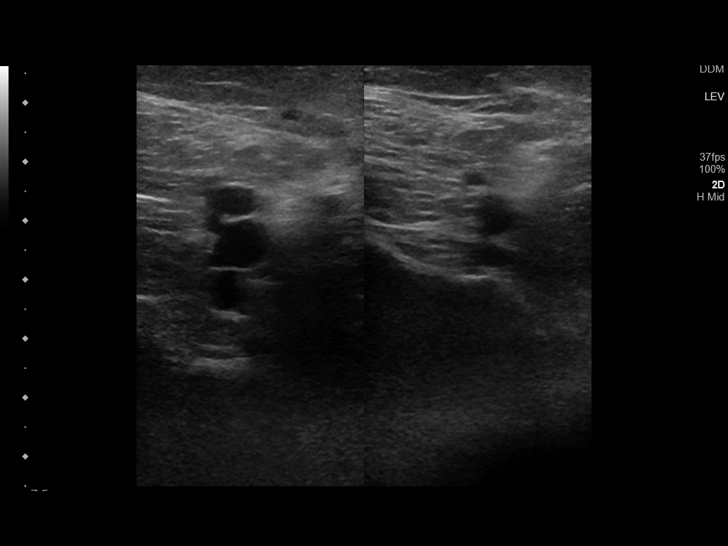
[im 30/33]
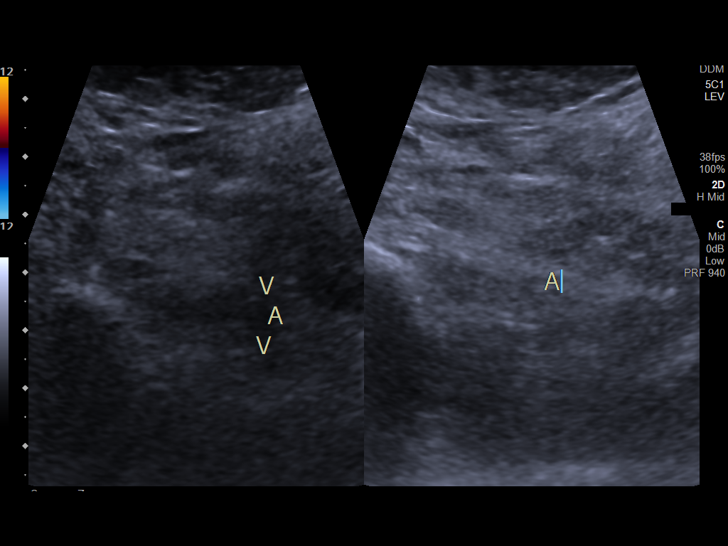
[im 33/33]
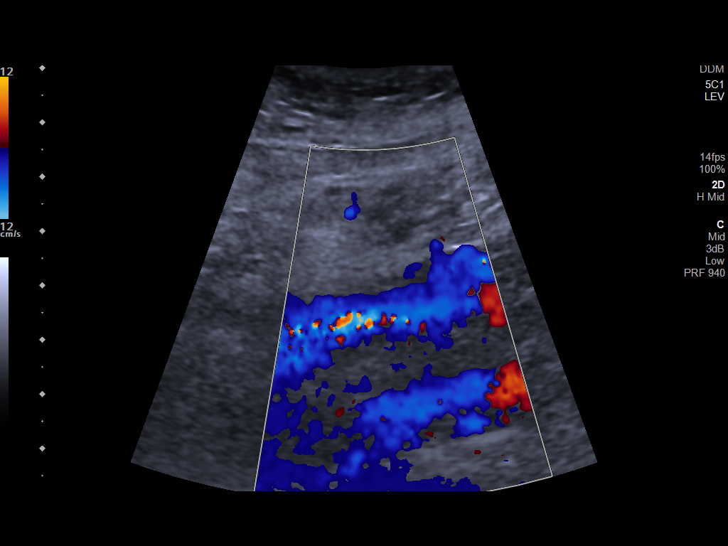

[14 of 24 positions shown; findings below may reference images not displayed]

FINDINGS: VENOUS

Normal compressibility of the common femoral, superficial femoral,
and popliteal veins, as well as the visualized calf veins.
Visualized portions of profunda femoral vein and great saphenous
vein unremarkable. No filling defects to suggest DVT on grayscale or
color Doppler imaging. Doppler waveforms show normal direction of
venous flow, normal respiratory plasticity and response to
augmentation.

Limited views of the contralateral common femoral vein are
unremarkable.

OTHER

None.

Limitations: none
IMPRESSION: No right lower extremity DVT

## 2021-08-31 ENCOUNTER — Other Ambulatory Visit: Payer: Self-pay | Admitting: Psychiatry

## 2021-08-31 DIAGNOSIS — F411 Generalized anxiety disorder: Secondary | ICD-10-CM

## 2021-09-30 ENCOUNTER — Other Ambulatory Visit: Payer: Self-pay

## 2021-09-30 ENCOUNTER — Encounter (INDEPENDENT_AMBULATORY_CARE_PROVIDER_SITE_OTHER): Payer: Self-pay | Admitting: Vascular Surgery

## 2021-09-30 ENCOUNTER — Ambulatory Visit (INDEPENDENT_AMBULATORY_CARE_PROVIDER_SITE_OTHER): Payer: Managed Care, Other (non HMO) | Admitting: Vascular Surgery

## 2021-09-30 VITALS — BP 126/74 | HR 72 | Resp 16 | Wt 336.0 lb

## 2021-09-30 DIAGNOSIS — I10 Essential (primary) hypertension: Secondary | ICD-10-CM

## 2021-09-30 DIAGNOSIS — K219 Gastro-esophageal reflux disease without esophagitis: Secondary | ICD-10-CM | POA: Diagnosis not present

## 2021-09-30 DIAGNOSIS — I872 Venous insufficiency (chronic) (peripheral): Secondary | ICD-10-CM | POA: Diagnosis not present

## 2021-09-30 DIAGNOSIS — I890 Lymphedema, not elsewhere classified: Secondary | ICD-10-CM

## 2021-09-30 NOTE — Progress Notes (Signed)
MRN : [id number]  Evelyn Flores is a 35 y.o. (04-Aug-1986) female who presents with chief complaint of check leg swelling.  History of Present Illness:   The patient returns to the office for followup evaluation regarding leg swelling.  The swelling has persisted but with the lymph pump is much, much better controlled. The pain associated with swelling is essentially eliminated. There have not been any interval development of a ulcerations or wounds.  She appears to have had an episode of STP that has largely resolved.   The patient denies problems with the pump, noting it is working well and the leggings are in good condition.   Since the previous visit the patient has been wearing graduated compression stockings and using the lymph pump on a routine basis and  has noted significant improvement in the lymphedema.    Patient stated the lymph pump has been a very positive factor in her care.   No outpatient medications have been marked as taking for the 09/30/21 encounter (Appointment) with [name], Dolores Lory, MD.    Past Medical History:  Diagnosis Date   Anxiety    Depression    DVT (deep venous thrombosis) ([place])    Ear infection     Past Surgical History:  Procedure Laterality Date   CHOLECYSTECTOMY     VEIN SURGERY Bilateral    vein surgery both legs    Social History Social History   Tobacco Use   Smoking status: Never   Smokeless tobacco: Never  Vaping Use   Vaping Use: Never used  Substance Use Topics   Alcohol use: No    Alcohol/week: 0.0 standard drinks   Drug use: No    Family History Family History  Problem Relation Age of Onset   Panic disorder Mother    Dementia Paternal Grandfather     Allergies  Allergen Reactions   Buspar [Buspirone]     spasms   Influenza Vaccines Other (See Comments)    Very sick and was hospitalized    Sulfa Antibiotics Swelling   Topiramate Other (See Comments)    Mental fogginess, tingling, numbness     REVIEW  OF SYSTEMS (Negative unless checked)  Constitutional: [] Weight loss  [] Fever  [] Chills Cardiac: [] Chest pain   [] Chest pressure   [] Palpitations   [] Shortness of breath when laying flat   [] Shortness of breath with exertion. Vascular:  [] Pain in legs with walking   [] Pain in legs at rest  [] History of DVT   [] Phlebitis   [x] Swelling in legs   [] Varicose veins   [] Non-healing ulcers Pulmonary:   [] Uses home oxygen   [] Productive cough   [] Hemoptysis   [] Wheeze  [] COPD   [] Asthma Neurologic:  [] Dizziness   [] Seizures   [] History of stroke   [] History of TIA  [] Aphasia   [] Vissual changes   [] Weakness or numbness in arm   [] Weakness or numbness in leg Musculoskeletal:   [] Joint swelling   [] Joint pain   [] Low back pain Hematologic:  [] Easy bruising  [] Easy bleeding   [] Hypercoagulable state   [] Anemic Gastrointestinal:  [] Diarrhea   [] Vomiting  [x] Gastroesophageal reflux/heartburn   [] Difficulty swallowing. Genitourinary:  [] Chronic kidney disease   [] Difficult urination  [] Frequent urination   [] Blood in urine Skin:  [] Rashes   [] Ulcers  Psychological:  [] History of anxiety   []  History of major depression.  Physical Examination  There were no vitals filed for this visit. There is no height or weight on file to calculate BMI.  Gen: WD/WN, NAD Head: [place]/AT, No temporalis wasting.  Ear/Nose/Throat: Hearing grossly intact, nares w/o erythema or drainage, pinna without lesions Eyes: PER, EOMI, sclera nonicteric.  Neck: Supple, no gross masses.  No JVD.  Pulmonary:  Good air movement, no audible wheezing, no use of accessory muscles.  Cardiac: RRR, precordium not hyperdynamic. Vascular:  scattered varicosities present bilaterally.  Mild venous stasis changes to the legs bilaterally.  2-3+ soft pitting edema  Vessel Right Left  Radial Palpable Palpable  Gastrointestinal: soft, non-distended. No guarding/no peritoneal signs.  Musculoskeletal: M/S 5/5 throughout.  No deformity.  Neurologic: CN 2-12  intact. Pain and light touch intact in extremities.  Symmetrical.  Speech is fluent. Motor exam as listed above. Psychiatric: Judgment intact, Mood & affect appropriate for pt's clinical situation. Dermatologic: Venous rashes no ulcers noted.  No changes consistent with cellulitis. Lymph : No lichenification or skin changes of chronic lymphedema.  CBC Lab Results  Component Value Date   WBC 9.1 01/26/2019   HGB 13.4 01/26/2019   HCT 39.0 01/26/2019   MCV 83.9 01/26/2019   PLT 228 01/26/2019    BMET    Component Value Date/Time   NA 136 01/26/2019 2159   NA 140 12/15/2014 2220   K 3.3 (L) 01/26/2019 2159   K 3.6 12/15/2014 2220   CL 106 01/26/2019 2159   CL 106 12/15/2014 2220   CO2 24 01/26/2019 2159   CO2 29 12/15/2014 2220   GLUCOSE 100 (H) 01/26/2019 2159   GLUCOSE 85 12/15/2014 2220   BUN 13 01/26/2019 2159   BUN 12 12/15/2014 2220   CREATININE 0.75 01/26/2019 2159   CREATININE 0.94 12/15/2014 2220   CALCIUM 9.2 01/26/2019 2159   CALCIUM 9.0 12/15/2014 2220   GFRNONAA >60 01/26/2019 2159   GFRNONAA >60 12/15/2014 2220   GFRAA >60 01/26/2019 2159   GFRAA >60 12/15/2014 2220   CrCl cannot be calculated (Patient's most recent lab result is older than the maximum 21 days allowed.).  COAG No results found for: INR, PROTIME  Radiology No results found.   Assessment/Plan 1. Lymphedema  No surgery or intervention at this point in time.    I have reviewed my discussion with the patient regarding lymphedema and why it  causes symptoms.  Patient will continue wearing graduated compression stockings class 1 (20-30 mmHg) on a daily basis a prescription was given. The patient is reminded to put the stockings on first thing in the morning and removing them in the evening. The patient is instructed specifically not to sleep in the stockings.   In addition, behavioral modification throughout the day will be continued.  This will include frequent elevation (such as in a  recliner), use of over the counter pain medications as needed and exercise such as walking.  I have reviewed systemic causes for chronic edema such as liver, kidney and cardiac etiologies and there does not appear to be any significant changes in these organ systems over the past year.  The patient is under the impression that these organ systems are all stable and unchanged.    The patient will continue aggressive use of the  lymph pump.  This will continue to improve the edema control and prevent sequela such as ulcers and infections.   The patient will follow-up with me on an annual basis.    2. Venous insufficiency  No surgery or intervention at this point in time.    I have reviewed my discussion with the patient regarding lymphedema and why  it  causes symptoms.  Patient will continue wearing graduated compression stockings class 1 (20-30 mmHg) on a daily basis a prescription was given. The patient is reminded to put the stockings on first thing in the morning and removing them in the evening. The patient is instructed specifically not to sleep in the stockings.   In addition, behavioral modification throughout the day will be continued.  This will include frequent elevation (such as in a recliner), use of over the counter pain medications as needed and exercise such as walking.  I have reviewed systemic causes for chronic edema such as liver, kidney and cardiac etiologies and there does not appear to be any significant changes in these organ systems over the past year.  The patient is under the impression that these organ systems are all stable and unchanged.    The patient will continue aggressive use of the  lymph pump.  This will continue to improve the edema control and prevent sequela such as ulcers and infections.   The patient will follow-up with me on an annual basis.    3. Essential hypertension Continue antihypertensive medications as already ordered, these medications have been  reviewed and there are no changes at this time.   4. Gastroesophageal reflux disease without esophagitis Continue PPI as already ordered, this medication has been reviewed and there are no changes at this time.  Avoidence of caffeine and alcohol  Moderate elevation of the head of the bed      Hortencia Pilar, MD  09/30/2021 1:30 PM

## 2021-10-08 ENCOUNTER — Encounter (INDEPENDENT_AMBULATORY_CARE_PROVIDER_SITE_OTHER): Payer: Self-pay | Admitting: Vascular Surgery

## 2021-10-14 ENCOUNTER — Encounter: Payer: Self-pay | Admitting: Psychiatry

## 2021-10-14 ENCOUNTER — Telehealth (INDEPENDENT_AMBULATORY_CARE_PROVIDER_SITE_OTHER): Payer: 59 | Admitting: Psychiatry

## 2021-10-14 ENCOUNTER — Other Ambulatory Visit: Payer: Self-pay

## 2021-10-14 DIAGNOSIS — F3342 Major depressive disorder, recurrent, in full remission: Secondary | ICD-10-CM

## 2021-10-14 DIAGNOSIS — G4701 Insomnia due to medical condition: Secondary | ICD-10-CM | POA: Diagnosis not present

## 2021-10-14 DIAGNOSIS — F411 Generalized anxiety disorder: Secondary | ICD-10-CM | POA: Diagnosis not present

## 2021-10-14 NOTE — Progress Notes (Signed)
Virtual Visit via Video Note  I connected with Evelyn Flores on 10/14/21 at  8:30 AM EDT by a video enabled telemedicine application and verified that I am speaking with the correct person using two identifiers.  Location Provider Location : [facility] Patient Location : Home  Participants: Patient , Provider   I discussed the limitations of evaluation and management by telemedicine and the availability of in person appointments. The patient expressed understanding and agreed to proceed.   I discussed the assessment and treatment plan with the patient. The patient was provided an opportunity to ask questions and all were answered. The patient agreed with the plan and demonstrated an understanding of the instructions.   The patient was advised to call back or seek an in-person evaluation if the symptoms worsen or if the condition fails to improve as anticipated.     [place] MD OP Progress Note  10/14/2021 8:38 AM Evelyn Flores  MRN:  [id number]  Chief Complaint:  Chief Complaint   Follow-up; Depression; Insomnia    HPI: Evelyn Flores is a 35 year old Caucasian female, single, employed, lives at [facility] , has a history of MDD, GAD, insomnia was evaluated by telemedicine today.  Patient today reports overall mood wise she is doing okay.  Denies any sadness, low motivation or anhedonia.  Sleep is good, currently on CPAP.  Reports work is going well.  Does have work-related stressors which could trigger anxiety however she has been managing it better.  Reports appetite is fair.  Continues to be compliant on the sertraline, is not interested in tapering it down yet, wants to stay on this dose.  Denies side effects.  Denies suicidality, homicidality or perceptual disturbances.  Patient denies any other concerns today.  Visit Diagnosis:    ICD-10-CM   1. MDD (major depressive disorder), recurrent, in full remission ([place])  F33.42     2. GAD (generalized anxiety disorder)  F41.1      3. 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.01    mood, sleep apnea      Past Psychiatric History: Reviewed past psychiatric history from progress note on 03/18/2018  Past Medical History:  Past Medical History:  Diagnosis Date   Anxiety    Depression    DVT (deep venous thrombosis) (HCC)    Ear infection     Past Surgical History:  Procedure Laterality Date   CHOLECYSTECTOMY     VEIN SURGERY Bilateral    vein surgery both legs    Family Psychiatric History: Reviewed family psychiatric history from progress note on 03/18/2018  Family History:  Family History  Problem Relation Age of Onset   Panic disorder Mother    Dementia Paternal Grandfather     Social History: Reviewed social history from progress note on 03/18/2018 Social History   Socioeconomic History   Marital status: Single    Spouse name: Not on file   Number of children: 0   Years of education: Not on file   Highest education level: High school graduate  Occupational History   Occupation: [employer]    Comment: fulltime  Tobacco Use   Smoking status: Never   Smokeless tobacco: Never  Vaping Use   Vaping Use: Never used  Substance and Sexual Activity   Alcohol use: No    Alcohol/week: 0.0 standard drinks   Drug use: No   Sexual activity: Yes    Birth control/protection: Injection, Condom  Other Topics Concern   Not on file  Social  History Narrative   Not on file   Social Determinants of Health   Financial Resource Strain: Not on file  Food Insecurity: Not on file  Transportation Needs: Not on file  Physical Activity: Not on file  Stress: Not on file  Social Connections: Not on file    Allergies:  Allergies  Allergen Reactions   Buspirone Other (See Comments)    spasms spasms spasms   Influenza Vaccines Other (See Comments)    Very sick and was hospitalized    Sulfa Antibiotics Swelling   Topiramate Other (See Comments)    Mental fogginess, tingling, numbness     Metabolic Disorder Labs: No results found for: HGBA1C, MPG No results found for: PROLACTIN No results found for: CHOL, TRIG, HDL, CHOLHDL, VLDL, LDLCALC No results found for: TSH  Therapeutic Level Labs: No results found for: LITHIUM No results found for: VALPROATE No components found for:  CBMZ  Current Medications: Current Outpatient Medications  Medication Sig Dispense Refill   pregabalin (LYRICA) 100 MG capsule Take 1 capsule by mouth 2 (two) times daily.     acetaminophen (TYLENOL) 325 MG tablet Take by mouth.     aspirin-acetaminophen-caffeine (EXCEDRIN MIGRAINE) 250-250-65 MG tablet Take by mouth.     fluticasone (FLONASE) 50 MCG/ACT nasal spray Place 2 sprays into both nostrils daily.  12   ibuprofen (ADVIL,MOTRIN) 600 MG tablet TAKE 1 TABLET BY MOUTH THREE TIMES DAILY WITH A MEAL FOR 7 DAYS  0   magnesium oxide (MAG-OX) 400 MG tablet Take by mouth.     medroxyPROGESTERone (DEPO-PROVERA) 150 MG/ML injection Inject 150 mg into the muscle every 3 (three) months. Last injection June 2015     medroxyPROGESTERone Acetate 150 MG/ML SUSY Inject 1 mL into the muscle every 3 (three) months.     omeprazole (PRILOSEC) 40 MG capsule Take 40 mg by mouth daily.     pregabalin (LYRICA) 75 MG capsule Take 1 capsule by mouth twice daily     pregabalin (LYRICA) 75 MG capsule Take by mouth.     rizatriptan (MAXALT-MLT) 10 MG disintegrating tablet      sertraline (ZOLOFT) 100 MG tablet TAKE TWO TABLETS BY MOUTH DAILY 180 tablet 0   triamterene-hydrochlorothiazide (MAXZIDE-25) 37.5-25 MG tablet Take 0.5 tablets by mouth daily.  6   No current facility-administered medications for this visit.     Musculoskeletal: Strength & Muscle Tone:  UTA Gait & Station: normal Patient leans: N/A  Psychiatric Specialty Exam: Review of Systems  Psychiatric/Behavioral:  The patient is nervous/anxious.   All other systems reviewed and are negative.  There were no vitals taken for this visit.There is  no height or weight on file to calculate BMI.  General Appearance: Casual  Eye Contact:  Fair  Speech:  Clear and Coherent  Volume:  Normal  Mood:  Anxious coping well  Affect:  Appropriate  Thought Process:  Goal Directed and Descriptions of Associations: Intact  Orientation:  Full (Time, Place, and Person)  Thought Content: Logical   Suicidal Thoughts:  No  Homicidal Thoughts:  No  Memory:  Immediate;   Fair Recent;   Fair Remote;   Fair  Judgement:  Fair  Insight:  Fair  Psychomotor Activity:  Normal  Concentration:  Concentration: Fair and Attention Span: Fair  Recall:  [employer] of Knowledge: Fair  Language: Fair  Akathisia:  No  Handed:  Right  AIMS (if indicated): done  Assets:  Communication Skills Desire for Improvement Housing Social Support  ADL's:  Intact  Cognition: WNL  Sleep:  Fair   Screenings: GAD-7    Flowsheet Row Video Visit from 10/14/2021 in [place] Video Visit from 07/19/2021 in [facility] Video Visit from 04/30/2020 in [place]  Total GAD-7 Score 1 0 11      PHQ2-9    Flowsheet Row Video Visit from 10/14/2021 in [place] Video Visit from 07/19/2021 in [place] Video Visit from 03/21/2021 in [place] [facility] from 05/07/2017 in [place]  PHQ-2 Total Score 0 0 0 0  PHQ-9 Total Score -- 0 -- --      Flowsheet Row ED from 07/03/2021 in [place] Video Visit from 03/21/2021 in [place] No Risk No Risk        Assessment and Plan: Evelyn Flores is a 35 year old Caucasian female, single, employed, lives in [place] has a history of MDD, GAD was evaluated by telemedicine today.  Patient is currently stable.  Plan as noted below.  Plan MDD  in remission Zoloft 200 mg p.o. daily AIMS - 0   GAD-stable Zoloft 200 mg p.o. daily  Insomnia-stable Continue CPAP for OSA  Patient will continue to need sufficient pain management.  Follow-up in clinic in 3 months in person or sooner as needed.  This note was generated in part or whole with voice recognition software. Voice recognition is usually quite accurate but there are transcription errors that can and very often do occur. I apologize for any typographical errors that were not detected and corrected.     Ursula Alert, MD 10/15/2021, 8:26 AM

## 2021-10-17 ENCOUNTER — Emergency Department: Payer: Managed Care, Other (non HMO)

## 2021-10-17 ENCOUNTER — Other Ambulatory Visit: Payer: Self-pay

## 2021-10-17 DIAGNOSIS — M79651 Pain in right thigh: Secondary | ICD-10-CM | POA: Diagnosis present

## 2021-10-17 DIAGNOSIS — Z79899 Other long term (current) drug therapy: Secondary | ICD-10-CM | POA: Diagnosis not present

## 2021-10-17 DIAGNOSIS — I10 Essential (primary) hypertension: Secondary | ICD-10-CM | POA: Insufficient documentation

## 2021-10-17 DIAGNOSIS — L03115 Cellulitis of right lower limb: Secondary | ICD-10-CM | POA: Insufficient documentation

## 2021-10-17 LAB — CBC
HCT: 39.5 % (ref 36.0–46.0)
Hemoglobin: 13.4 g/dL (ref 12.0–15.0)
MCH: 29.1 pg (ref 26.0–34.0)
MCHC: 33.9 g/dL (ref 30.0–36.0)
MCV: 85.7 fL (ref 80.0–100.0)
Platelets: 229 10*3/uL (ref 150–400)
RBC: 4.61 MIL/uL (ref 3.87–5.11)
RDW: 13.2 % (ref 11.5–15.5)
WBC: 9 10*3/uL (ref 4.0–10.5)
nRBC: 0 % (ref 0.0–0.2)

## 2021-10-17 LAB — COMPREHENSIVE METABOLIC PANEL
ALT: 27 U/L (ref 0–44)
AST: 20 U/L (ref 15–41)
Albumin: 4 g/dL (ref 3.5–5.0)
Alkaline Phosphatase: 53 U/L (ref 38–126)
Anion gap: 8 (ref 5–15)
BUN: 16 mg/dL (ref 6–20)
CO2: 26 mmol/L (ref 22–32)
Calcium: 9.3 mg/dL (ref 8.9–10.3)
Chloride: 105 mmol/L (ref 98–111)
Creatinine, Ser: 0.9 mg/dL (ref 0.44–1.00)
GFR, Estimated: >60 mL/min (ref >60)
Glucose, Bld: 119 mg/dL — ABNORMAL HIGH (ref 70–99)
Potassium: 3.6 mmol/L (ref 3.5–5.1)
Sodium: 139 mmol/L (ref 135–145)
Total Bilirubin: 0.5 mg/dL (ref 0.3–1.2)
Total Protein: 7.5 g/dL (ref 6.5–8.1)

## 2021-10-17 NOTE — ED Triage Notes (Signed)
Pt in with co redness and swelling to right leg below knee x 4 weeks. Pt has hx of DVT and saw vein specialist 3 weeks ago and was told she had superficial clot but did not have ultrasound done. Pt did not get put on blood thinners, and is here today for worsening redness and swelling that is radiating to right thigh.

## 2021-10-18 ENCOUNTER — Encounter (INDEPENDENT_AMBULATORY_CARE_PROVIDER_SITE_OTHER): Payer: Self-pay

## 2021-10-18 ENCOUNTER — Emergency Department
Admission: EM | Admit: 2021-10-18 | Discharge: 2021-10-18 | Disposition: A | Discharge status: Home / Self Care | Payer: Managed Care, Other (non HMO) | Attending: Emergency Medicine | Admitting: Emergency Medicine

## 2021-10-18 DIAGNOSIS — L03115 Cellulitis of right lower limb: Secondary | ICD-10-CM

## 2021-10-18 MED ORDER — ONDANSETRON 4 MG PO TBDP: 4.0000 mg | ORAL_TABLET | Freq: Four times a day (QID) | ORAL | 0 refills | Status: DC | PRN

## 2021-10-18 MED ORDER — IBUPROFEN 800 MG PO TABS: 800.0000 mg | ORAL_TABLET | Freq: Three times a day (TID) | ORAL | 0 refills | Status: DC | PRN

## 2021-10-18 MED ORDER — ONDANSETRON 4 MG PO TBDP
4.0000 mg | ORAL_TABLET | Freq: Once | ORAL | Status: AC
  Administered 2021-10-18: 4 mg via ORAL
  Filled 2021-10-18: qty 1

## 2021-10-18 MED ORDER — HYDROCODONE-ACETAMINOPHEN 5-325 MG PO TABS: 2.0000 | ORAL_TABLET | Freq: Four times a day (QID) | ORAL | 0 refills | Status: DC | PRN

## 2021-10-18 MED ORDER — HYDROCODONE-ACETAMINOPHEN 5-325 MG PO TABS
2.0000 | ORAL_TABLET | Freq: Once | ORAL | Status: AC
Start: 2021-10-18 — End: 2021-10-18
  Administered 2021-10-18: 2 via ORAL
  Filled 2021-10-18: qty 2

## 2021-10-18 MED ORDER — CEPHALEXIN 500 MG PO CAPS: 500.0000 mg | ORAL_CAPSULE | Freq: Four times a day (QID) | ORAL | 0 refills | Status: AC

## 2021-10-18 MED ORDER — CEPHALEXIN 500 MG PO CAPS
500.0000 mg | ORAL_CAPSULE | Freq: Once | ORAL | Status: AC
  Administered 2021-10-18: 500 mg via ORAL
  Filled 2021-10-18: qty 1

## 2021-10-18 NOTE — Discharge Instructions (Addendum)
Please continue ibuprofen 800 mg every 8 hours as needed for pain, redness and swelling.  Please follow-up with your primary care doctor if symptoms or not improving.  Your blood work and ultrasound today were normal.  Please take your antibiotics until complete.  I recommend taking an over-the-counter probiotic daily while on antibiotics.

## 2021-10-18 NOTE — ED Provider Notes (Signed)
[facility] Emergency Department Provider Note  ____________________________________________   Event Date/Time   First MD Initiated Contact with Patient 10/18/21 0128     (approximate)  I have reviewed the triage vital signs and the nursing notes.   HISTORY  Chief Complaint Leg Swelling    HPI Evelyn Flores is a 35 y.o. female with history of obesity, DVT, lymphedema, PCOS who presents to the emergency department with 4 weeks of red streaking and discomfort to the right medial thigh.  She states that she saw her vascular doctor 4 weeks ago who told her that this was likely superficial thrombophlebitis and recommended NSAIDs.  States she has been taking NSAIDs but now feels like the redness is spreading.  No fevers, chills.  She is not a diabetic.  Denies any chest pain or shortness of breath.      Past Medical History:  Diagnosis Date   Anxiety    Depression    DVT (deep venous thrombosis) ([place])    Ear infection     Patient Active Problem List   Diagnosis Date Noted   Lymphedema 12/03/2020   Plantar fasciitis, left 02/16/2020   Lymphedema of lower extremity 12/07/2019   Essential hypertension 11/24/2019   Uterine cramping 06/28/2019   MDD (major depressive disorder), recurrent, in full remission ([place]) 06/28/2019   Insomnia 06/28/2019   GAD (generalized anxiety disorder) 06/28/2019   Bereavement 06/28/2019   MDD (major depressive disorder), recurrent episode, mild ([place]) 03/18/2018   Varicose veins with pain 02/18/2017   Venous insufficiency 12/05/2015   Chronic deep vein thrombosis (DVT) of femoral vein of right lower extremity ([place]) 11/01/2015   Gastroesophageal reflux disease without esophagitis 11/01/2015   Leg swelling 11/01/2015   PCOS (polycystic ovarian syndrome) 11/01/2015   Obesity, Class III, BMI 40-49.9 (morbid obesity) ([place]) 11/01/2015   Allergic rhinitis due to pollen 11/01/2015   Sensorineural hearing loss of both ears  11/01/2015   Tinnitus 11/01/2015   Abnormal EKG 03/08/2015   Chest pressure 03/08/2015   Swelling of left lower extremity 10/11/2014   Neoplasm of uncertain behavior of skin 09/19/2014   Abdominal pain 03/14/2014   Diarrhea, unspecified 03/14/2014   GERD (gastroesophageal reflux disease) 03/14/2014   Morbid obesity ([place]) 03/14/2014   Tibial tendonitis, posterior 08/24/2013   History of blood clots 07/01/2013   Paresthesias 12/29/2012   Meniere's disease 10/18/2012   Migraine 10/18/2012    Past Surgical History:  Procedure Laterality Date   CHOLECYSTECTOMY     VEIN SURGERY Bilateral    vein surgery both legs    Prior to Admission medications   Medication Sig Start Date End Date Taking? Authorizing Provider  cephALEXin (KEFLEX) 500 MG capsule Take 1 capsule (500 mg total) by mouth 4 (four) times daily for 7 days. 10/18/21 10/25/21 Yes Antonios Ostrow, Delice Bison, DO  HYDROcodone-acetaminophen (NORCO/VICODIN) 5-325 MG tablet Take 2 tablets by mouth every 6 (six) hours as needed. 10/18/21  Yes Tomie Elko, Delice Bison, DO  ibuprofen (ADVIL) 800 MG tablet Take 1 tablet (800 mg total) by mouth every 8 (eight) hours as needed for mild pain. 10/18/21  Yes Asad Keeven N, DO  ondansetron (ZOFRAN ODT) 4 MG disintegrating tablet Take 1 tablet (4 mg total) by mouth every 6 (six) hours as needed for nausea or vomiting. 10/18/21  Yes Daxson Reffett, Delice Bison, DO  acetaminophen (TYLENOL) 325 MG tablet Take by mouth.    [provider]  aspirin-acetaminophen-caffeine (EXCEDRIN MIGRAINE) 2090382377 MG tablet Take by mouth.  [provider]  fluticasone (FLONASE) 50 MCG/ACT nasal spray Place 2 sprays into both nostrils daily. 01/30/17   [provider]  magnesium oxide (MAG-OX) 400 MG tablet Take by mouth.    [provider]  medroxyPROGESTERone (DEPO-PROVERA) 150 MG/ML injection Inject 150 mg into the muscle every 3 (three) months. Last injection June 2015    [provider]   medroxyPROGESTERone Acetate 150 MG/ML SUSY Inject 1 mL into the muscle every 3 (three) months. 02/08/21   [provider]  omeprazole (PRILOSEC) 40 MG capsule Take 40 mg by mouth daily.    [provider]  pregabalin (LYRICA) 100 MG capsule Take 1 capsule by mouth 2 (two) times daily. 10/11/21 10/11/22  [provider]  pregabalin (LYRICA) 75 MG capsule Take 1 capsule by mouth twice daily 09/06/19   [provider]  pregabalin (LYRICA) 75 MG capsule Take by mouth. 07/17/21 07/17/22  [provider]  rizatriptan (MAXALT-MLT) 10 MG disintegrating tablet  09/12/19   [provider]  sertraline (ZOLOFT) 100 MG tablet TAKE TWO TABLETS BY MOUTH DAILY 09/02/21   Ursula Alert, MD  triamterene-hydrochlorothiazide (MAXZIDE-25) 37.5-25 MG tablet Take 0.5 tablets by mouth daily. 04/24/18   [provider]    Allergies Buspirone, Influenza vaccines, Sulfa antibiotics, and Topiramate  Family History  Problem Relation Age of Onset   Panic disorder Mother    Dementia Paternal Grandfather     Social History Social History   Tobacco Use   Smoking status: Never   Smokeless tobacco: Never  Vaping Use   Vaping Use: Never used  Substance Use Topics   Alcohol use: No    Alcohol/week: 0.0 standard drinks   Drug use: No    Review of Systems Constitutional: No fever. Eyes: No visual changes. ENT: No sore throat. Cardiovascular: Denies chest pain. Respiratory: Denies shortness of breath. Gastrointestinal: No nausea, vomiting, diarrhea. Genitourinary: Negative for dysuria. Musculoskeletal: Negative for back pain. Skin: Negative for rash. Neurological: Negative for focal weakness or numbness.  ____________________________________________   PHYSICAL EXAM:  VITAL SIGNS: ED Triage Vitals  Enc Vitals Group     BP 10/17/21 2012 (!) 142/80     Pulse Rate 10/17/21 2012 91     Resp 10/17/21 2012 20     Temp 10/17/21 2012 98.5 F (36.9 C)      Temp Source 10/17/21 2012 Oral     SpO2 10/17/21 2012 95 %     Weight 10/17/21 2012 (!) 330 lb (149.7 kg)     Height 10/17/21 2012 5\' 6"  (1.676 m)     Head Circumference --      Peak Flow --      Pain Score 10/17/21 2019 8     Pain Loc --      Pain Edu? --      Excl. in [place]? --    CONSTITUTIONAL: Alert and oriented and responds appropriately to questions. Well-appearing; well-nourished, morbidly obese HEAD: Normocephalic EYES: Conjunctivae clear, pupils appear equal, EOM appear intact ENT: normal nose; moist mucous membranes NECK: Supple, normal ROM CARD: RRR; S1 and S2 appreciated; no murmurs, no clicks, no rubs, no gallops RESP: Normal chest excursion without splinting or tachypnea; breath sounds clear and equal bilaterally; no wheezes, no rhonchi, no rales, no hypoxia or respiratory distress, speaking full sentences ABD/GI: Normal bowel sounds; non-distended; soft, non-tender, no rebound, no guarding, no peritoneal signs, no hepatosplenomegaly BACK: The back appears normal EXT: Normal ROM in all joints; no deformity noted, no edema;  no cyanosis, compartments soft, 2+ DP pulses bilaterally, patient does have areas of patchy redness and warmth to the medial right thigh without induration, fluctuance, no joint effusion noted, no bony deformity SKIN: Normal color for age and race; warm; no rash on exposed skin NEURO: Moves all extremities equally PSYCH: The patient's mood and manner are appropriate.  ____________________________________________   LABS (all labs ordered are listed, but only abnormal results are displayed)  Labs Reviewed  COMPREHENSIVE METABOLIC PANEL - Abnormal; Notable for the following components:      Result Value   Glucose, Bld 119 (*)    All other components within normal limits  CBC   ____________________________________________  EKG   ____________________________________________  RADIOLOGY I, Tewana Bohlen, personally viewed and evaluated these  images (plain radiographs) as part of my medical decision making, as well as reviewing the written report by the radiologist.  ED MD interpretation: Venous Doppler shows no DVT.  Official radiology report(s): US Venous Img Lower Unilateral Right  Result Date: 10/17/2021 CLINICAL DATA:  Right lower extremity swelling x4 weeks. EXAM: RIGHT LOWER EXTREMITY VENOUS DOPPLER ULTRASOUND TECHNIQUE: Gray-scale sonography with compression, as well as color and duplex ultrasound, were performed to evaluate the deep venous system(s) from the level of the common femoral vein through the popliteal and proximal calf veins. COMPARISON:  None. FINDINGS: VENOUS Normal compressibility of the common femoral, superficial femoral, and popliteal veins. There is limited visualization of the posterior tibial and peroneal veins. Visualized portions of profunda femoral vein and great saphenous vein unremarkable. No filling defects to suggest DVT on grayscale or color Doppler imaging. Doppler waveforms show normal direction of venous flow, normal respiratory plasticity and response to augmentation. Limited views of the contralateral common femoral vein are unremarkable. OTHER None. Limitations: none IMPRESSION: Limited evaluation of the RIGHT calf veins, without evidence of RIGHT lower extremity DVT. Electronically Signed   By: Virgina Norfolk M.D.   On: 10/17/2021 21:18    ____________________________________________   PROCEDURES  Procedure(s) performed (including Critical Care):  Procedures   ____________________________________________   INITIAL IMPRESSION / ASSESSMENT AND PLAN / ED COURSE  As part of my medical decision making, I reviewed the following data within the [place] notes reviewed and incorporated, Labs reviewed , Old chart reviewed, ultrasound reviewed, Notes from prior ED visits, and [place] Controlled Substance Database         Patient here with patchy areas of redness and  warmth to the right thigh that has not been improving with over-the-counter NSAIDs.  States she saw her vascular specialist who thought this was superficial thrombophlebitis.  She states that she became concerned when the area began to spread and became more painful.  Discussed with patient that this could be cellulitis but appears very mild in nature.  No sign of abscess, septic arthritis, gout, compartment syndrome, arterial obstruction.  She has neurovascularly intact distally.  Labs obtained from triage are reassuring with normal white blood cell count.  She also had a right lower extremity venous Doppler in triage that showed no DVT.  I feel she is safe to be discharged home with Keflex and pain medication.  We have outlined area of redness with a tissue marker.  Discussed return precautions.  Patient and mother are comfortable with this plan.  At this time, I do not feel there is any life-threatening condition present. I have reviewed, interpreted and discussed all results (EKG, imaging, lab, urine as appropriate) and exam findings with patient/family. I have  reviewed nursing notes and appropriate previous records.  I feel the patient is safe to be discharged home without further emergent workup and can continue workup as an outpatient as needed. Discussed usual and customary return precautions. Patient/family verbalize understanding and are comfortable with this plan.  Outpatient follow-up has been provided as needed. All questions have been answered.  ____________________________________________   FINAL CLINICAL IMPRESSION(S) / ED DIAGNOSES  Final diagnoses:  Cellulitis of right lower extremity     ED Discharge Orders          Ordered    HYDROcodone-acetaminophen (NORCO/VICODIN) 5-325 MG tablet  Every 6 hours PRN        10/18/21 0202    ibuprofen (ADVIL) 800 MG tablet  Every 8 hours PRN        10/18/21 0202    ondansetron (ZOFRAN ODT) 4 MG disintegrating tablet  Every 6 hours PRN         10/18/21 0202    cephALEXin (KEFLEX) 500 MG capsule  4 times daily        10/18/21 0202            *Please note:  Evelyn Flores was evaluated in Emergency Department on 10/18/2021 for the symptoms described in the history of present illness. She was evaluated in the context of the global COVID-19 pandemic, which necessitated consideration that the patient might be at risk for infection with the SARS-CoV-2 virus that causes COVID-19. Institutional protocols and algorithms that pertain to the evaluation of patients at risk for COVID-19 are in a state of rapid change based on information released by regulatory bodies including the CDC and federal and state organizations. These policies and algorithms were followed during the patient's care in the ED.  Some ED evaluations and interventions may be delayed as a result of limited staffing during and the pandemic.*   Note:  This document was prepared using Dragon voice recognition software and may include unintentional dictation errors.    Jaymison Luber, Delice Bison, DO 10/18/21 [phone]

## 2021-11-25 IMAGING — US US EXTREM LOW VENOUS*R*
1 series · 14 of 24 positions shown · non-contrast
Comparison: None.

CLINICAL DATA: Right lower extremity swelling x4 weeks.

EXAM:
RIGHT LOWER EXTREMITY VENOUS DOPPLER ULTRASOUND
TECHNIQUE: Gray-scale sonography with compression, as well as color and duplex
ultrasound, were performed to evaluate the deep venous system(s)
from the level of the common femoral vein through the popliteal and
proximal calf veins.

[Series 1: us venous img lower uni right (dvt) · portal-venous · 14 of 34 slices shown]
[im 1/34]
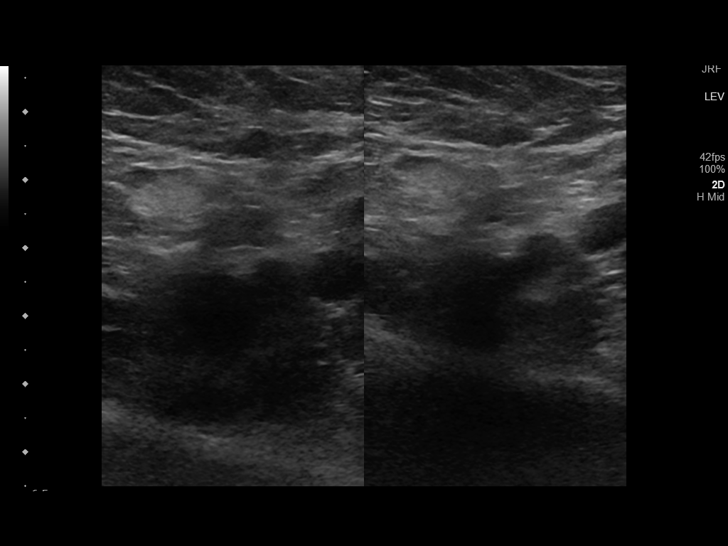
[im 3/34]
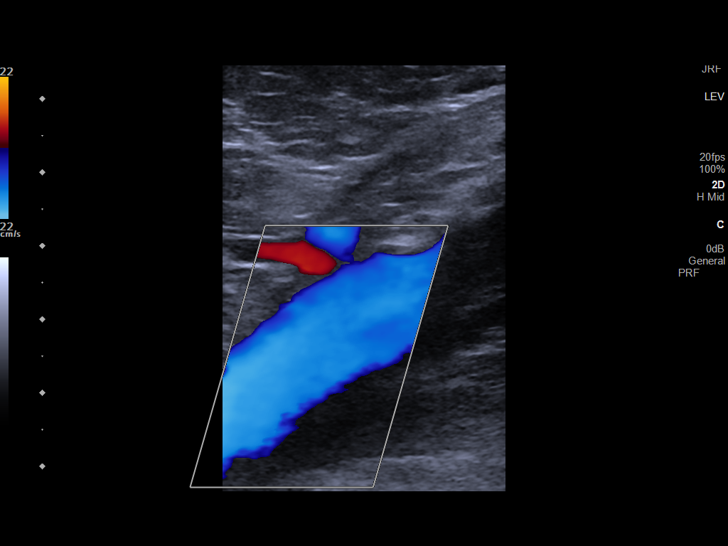
[im 6/34]
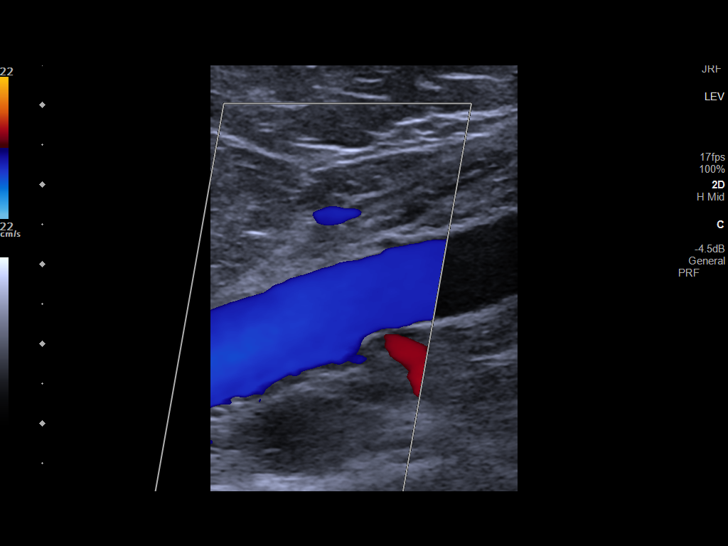
[im 9/34]
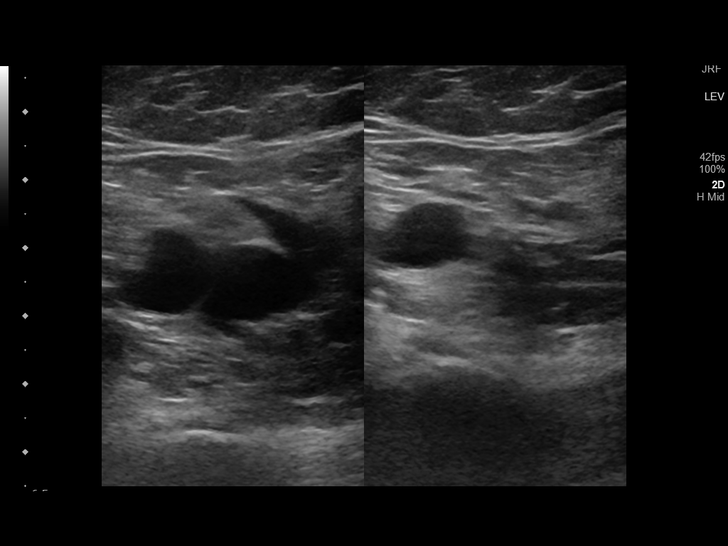
[im 11/34]
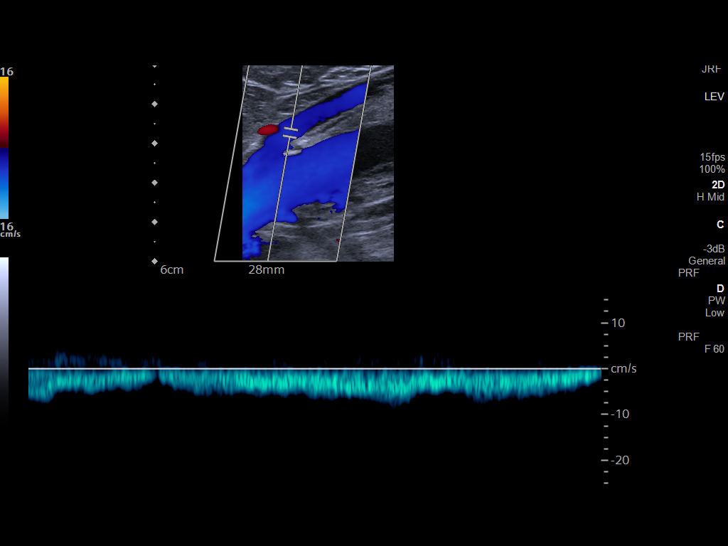
[im 13/34]
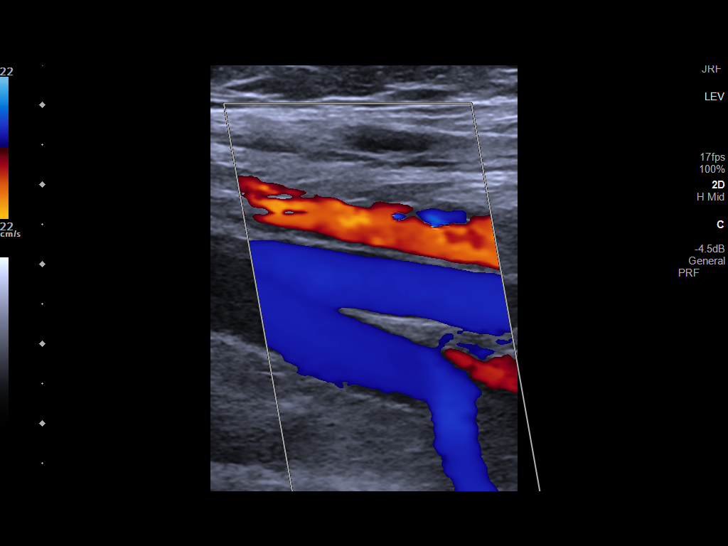
[im 16/34]
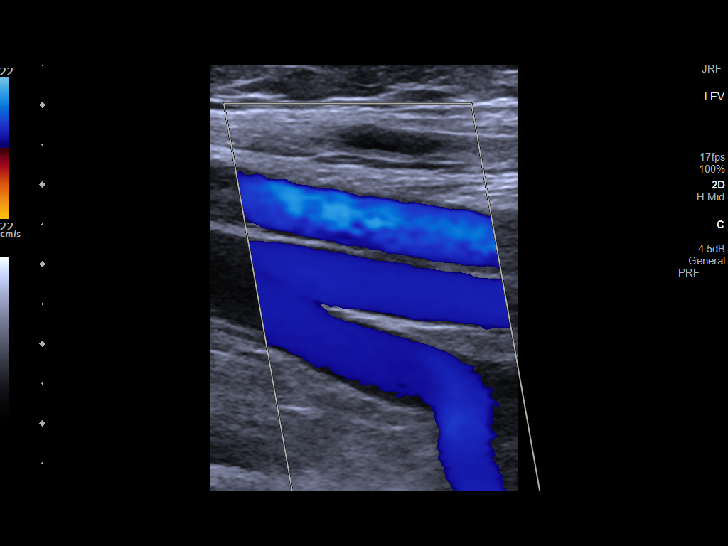
[im 18/34]
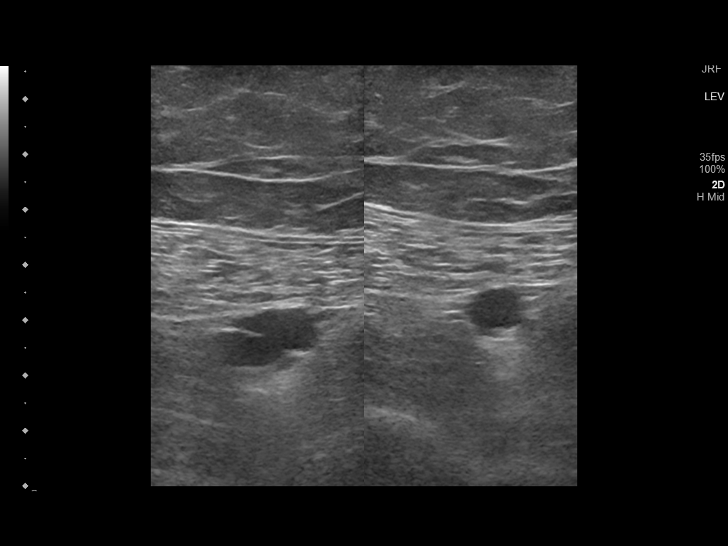
[im 21/34]
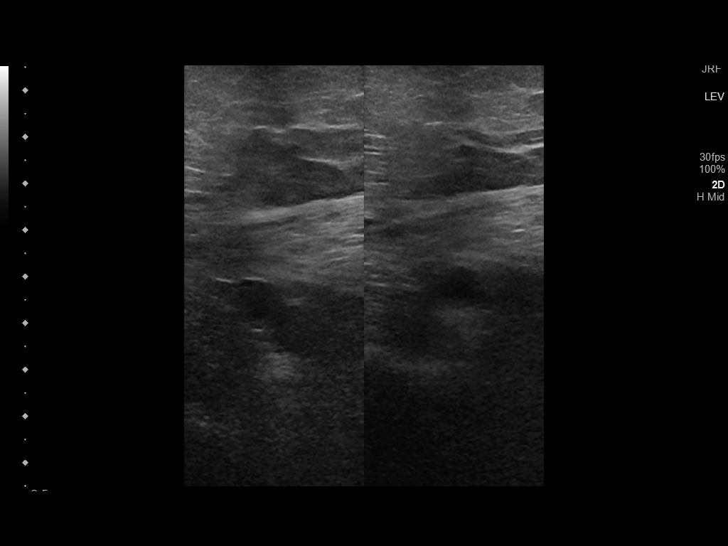
[im 23/34]
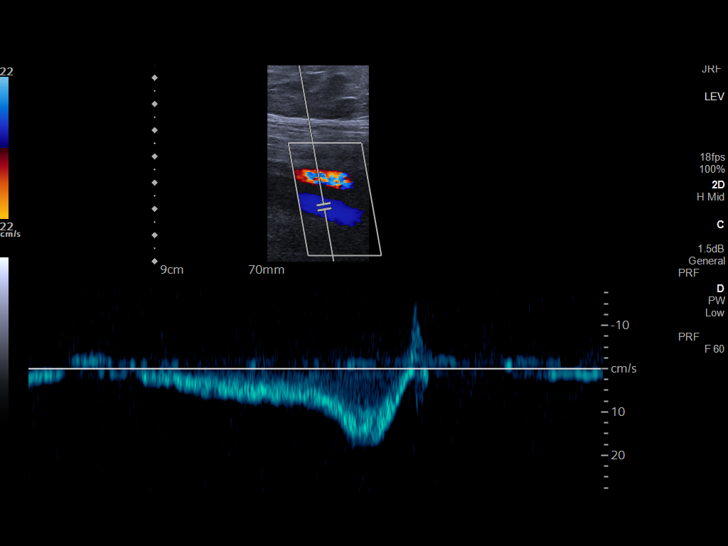
[im 26/34]
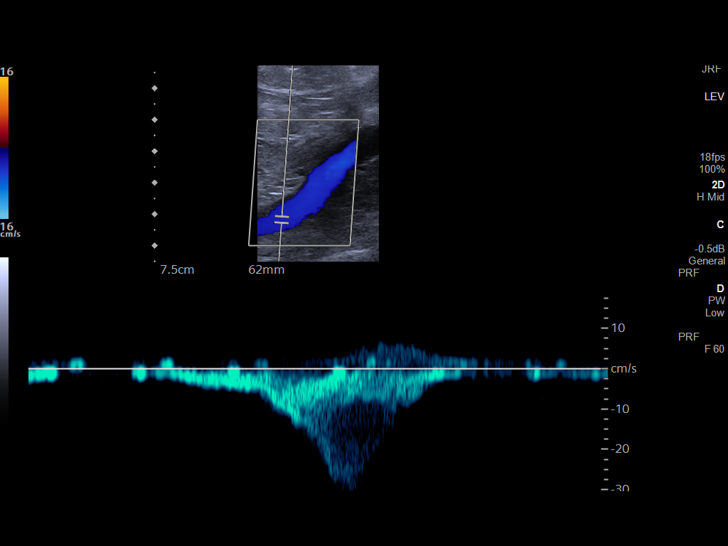
[im 28/34]
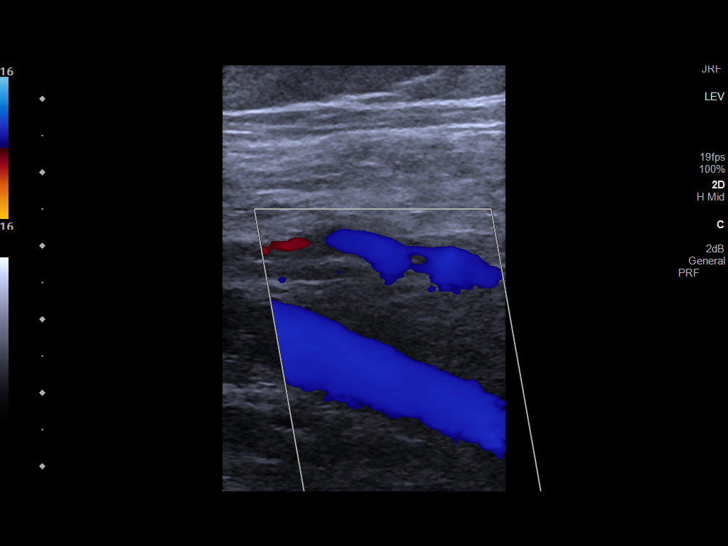
[im 31/34]
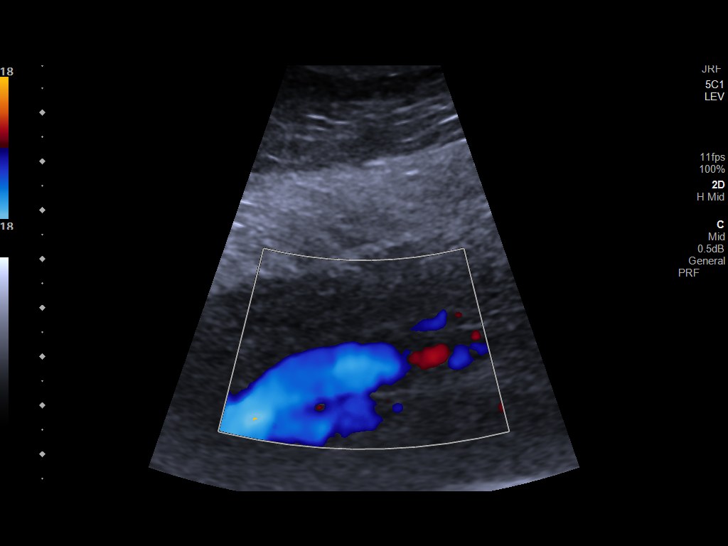
[im 34/34]
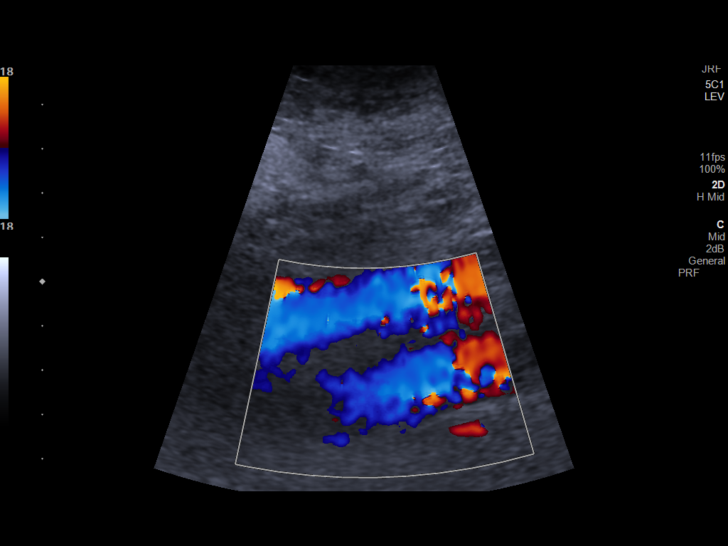

[14 of 24 positions shown; findings below may reference images not displayed]

FINDINGS: VENOUS

Normal compressibility of the common femoral, superficial femoral,
and popliteal veins. There is limited visualization of the posterior
tibial and peroneal veins. Visualized portions of profunda femoral
vein and great saphenous vein unremarkable. No filling defects to
suggest DVT on grayscale or color Doppler imaging. Doppler waveforms
show normal direction of venous flow, normal respiratory plasticity
and response to augmentation.

Limited views of the contralateral common femoral vein are
unremarkable.

OTHER

None.

Limitations: none
IMPRESSION: Limited evaluation of the RIGHT calf veins, without evidence of
RIGHT lower extremity DVT.

## 2021-11-30 ENCOUNTER — Other Ambulatory Visit: Payer: Self-pay | Admitting: Psychiatry

## 2021-11-30 DIAGNOSIS — F411 Generalized anxiety disorder: Secondary | ICD-10-CM

## 2021-12-26 ENCOUNTER — Encounter: Payer: Self-pay | Admitting: Psychiatry

## 2021-12-26 ENCOUNTER — Other Ambulatory Visit: Payer: Self-pay

## 2021-12-26 ENCOUNTER — Ambulatory Visit (INDEPENDENT_AMBULATORY_CARE_PROVIDER_SITE_OTHER): Payer: BC Managed Care – PPO | Admitting: Psychiatry

## 2021-12-26 VITALS — BP 128/82 | HR 101 | Temp 98.3°F | Wt 342.4 lb

## 2021-12-26 DIAGNOSIS — F3342 Major depressive disorder, recurrent, in full remission: Secondary | ICD-10-CM | POA: Diagnosis not present

## 2021-12-26 DIAGNOSIS — G4701 Insomnia due to medical condition: Secondary | ICD-10-CM

## 2021-12-26 DIAGNOSIS — F411 Generalized anxiety disorder: Secondary | ICD-10-CM

## 2021-12-26 NOTE — Progress Notes (Signed)
[facility] MD OP Progress Note  12/26/2021 9:02 AM Evelyn Flores  MRN:  [id number]  Chief Complaint:  Chief Complaint   Follow-up; Depression; Anxiety    HPI: Evelyn Flores is a 36 year old Caucasian female, single, employed, lives at [facility], has a history of MDD, GAD, insomnia was evaluated in office today.  Patient today reports she is currently in a lot of pain, left foot, going on since the past 1 month, rates it at a 6 out of 10, 10 being the worst.  Exacerbated by walking, some relief when she lays in bed.  Takes Tylenol as needed.  Planning to go [facility].  Patient reports overall her mood symptoms are stable.  Denies any sadness, low motivation or anhedonia.  Reports appetite as good.  May have gained a few pounds due to the holidays.  She is mindful about the same.  Denies any significant worrying, nervousness.  Denies anxiety attacks.  Sleep is good at this time.  Reports she is compliant on CPAP.  Reports work-related stressors have improved.  There has been some changes made at work which has helped.  Denies any suicidality, homicidality or perceptual disturbances.  Patient denies any other concerns today.  Visit Diagnosis:    ICD-10-CM   1. MDD (major depressive disorder), recurrent, in full remission ([place])  F33.42     2. GAD (generalized anxiety disorder)  F41.1     3. 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.01    mood , sleep apnea      Past Psychiatric History: Reviewed past psychiatric history from progress note on 03/18/2018.  Past Medical History:  Past Medical History:  Diagnosis Date   Anxiety    Depression    DVT (deep venous thrombosis) (HCC)    Ear infection     Past Surgical History:  Procedure Laterality Date   CHOLECYSTECTOMY     VEIN SURGERY Bilateral    vein surgery both legs    Family Psychiatric History: Reviewed family psychiatric history from progress note on 03/18/2018.  Family History:  Family History   Problem Relation Age of Onset   Panic disorder Mother    Dementia Paternal Grandfather     Social History: Reviewed social history from progress note on 03/18/2018. Social History   Socioeconomic History   Marital status: Single    Spouse name: Not on file   Number of children: 0   Years of education: Not on file   Highest education level: High school graduate  Occupational History   Occupation: [employer]    Comment: fulltime  Tobacco Use   Smoking status: Never   Smokeless tobacco: Never  Vaping Use   Vaping Use: Never used  Substance and Sexual Activity   Alcohol use: No    Alcohol/week: 0.0 standard drinks   Drug use: No   Sexual activity: Yes    Birth control/protection: Injection, Condom  Other Topics Concern   Not on file  Social History Narrative   Not on file   Social Determinants of Health   Financial Resource Strain: Not on file  Food Insecurity: Not on file  Transportation Needs: Not on file  Physical Activity: Not on file  Stress: Not on file  Social Connections: Not on file    Allergies:  Allergies  Allergen Reactions   Buspirone Other (See Comments)    spasms spasms spasms   Influenza Vaccines Other (See Comments)    Very sick and was hospitalized  Sulfa Antibiotics Swelling   Topiramate Other (See Comments)    Mental fogginess, tingling, numbness    Metabolic Disorder Labs: No results found for: HGBA1C, MPG No results found for: PROLACTIN No results found for: CHOL, TRIG, HDL, CHOLHDL, VLDL, LDLCALC No results found for: TSH  Therapeutic Level Labs: No results found for: LITHIUM No results found for: VALPROATE No components found for:  CBMZ  Current Medications: Current Outpatient Medications  Medication Sig Dispense Refill   acetaminophen (TYLENOL) 325 MG tablet Take by mouth.     aspirin-acetaminophen-caffeine (EXCEDRIN MIGRAINE) 250-250-65 MG tablet Take by mouth.     fluticasone (FLONASE) 50 MCG/ACT nasal spray Place  2 sprays into both nostrils daily.  12   medroxyPROGESTERone (DEPO-PROVERA) 150 MG/ML injection Inject 150 mg into the muscle every 3 (three) months. Last injection June 2015     omeprazole (PRILOSEC) 40 MG capsule Take 40 mg by mouth daily.     pregabalin (LYRICA) 75 MG capsule Take 1 capsule by mouth twice daily     rizatriptan (MAXALT-MLT) 10 MG disintegrating tablet      sertraline (ZOLOFT) 100 MG tablet TAKE TWO TABLETS BY MOUTH DAILY 180 tablet 0   triamterene-hydrochlorothiazide (MAXZIDE-25) 37.5-25 MG tablet Take 0.5 tablets by mouth daily.  6   HYDROcodone-acetaminophen (NORCO/VICODIN) 5-325 MG tablet Take 2 tablets by mouth every 6 (six) hours as needed. (Patient not taking: Reported on 12/26/2021) 14 tablet 0   ibuprofen (ADVIL) 800 MG tablet Take 1 tablet (800 mg total) by mouth every 8 (eight) hours as needed for mild pain. (Patient not taking: Reported on 12/26/2021) 30 tablet 0   magnesium oxide (MAG-OX) 400 MG tablet Take by mouth. (Patient not taking: Reported on 12/26/2021)     medroxyPROGESTERone Acetate 150 MG/ML SUSY Inject 1 mL into the muscle every 3 (three) months. (Patient not taking: Reported on 12/26/2021)     ondansetron (ZOFRAN ODT) 4 MG disintegrating tablet Take 1 tablet (4 mg total) by mouth every 6 (six) hours as needed for nausea or vomiting. (Patient not taking: Reported on 12/26/2021) 20 tablet 0   pregabalin (LYRICA) 100 MG capsule Take 1 capsule by mouth 2 (two) times daily. (Patient not taking: Reported on 12/26/2021)     pregabalin (LYRICA) 75 MG capsule Take by mouth. (Patient not taking: Reported on 12/26/2021)     No current facility-administered medications for this visit.     Musculoskeletal: Strength & Muscle Tone: within normal limits Gait & Station:  slow , with a slight limp Patient leans: N/A  Psychiatric Specialty Exam: Review of Systems  Psychiatric/Behavioral:  Negative for agitation, behavioral problems, confusion, decreased concentration, dysphoric  mood, hallucinations, self-injury, sleep disturbance and suicidal ideas. The patient is not nervous/anxious and is not hyperactive.   All other systems reviewed and are negative.  Blood pressure 128/82, pulse (!) 101, temperature 98.3 F (36.8 C), temperature source Temporal, weight (!) 342 lb 6.4 oz (155.3 kg).Body mass index is 55.26 kg/m.  General Appearance: Casual  Eye Contact:  Fair  Speech:  Clear and Coherent  Volume:  Normal  Mood:  Euthymic  Affect:  Congruent  Thought Process:  Goal Directed and Descriptions of Associations: Intact  Orientation:  Full (Time, Place, and Person)  Thought Content: Logical   Suicidal Thoughts:  No  Homicidal Thoughts:  No  Memory:  Immediate;   Fair Recent;   Fair Remote;   Fair  Judgement:  Fair  Insight:  Fair  Psychomotor Activity:  Normal  Concentration:  Concentration: Fair and Attention Span: Fair  Recall:  [employer] of Knowledge: Fair  Language: Fair  Akathisia:  No  Handed:  Right  AIMS (if indicated): done,0  Assets:  Communication Skills Desire for [place] Talents/Skills Transportation Vocational/Educational  ADL's:  Intact  Cognition: WNL  Sleep:  Fair   Screenings: GAD-7    [place] Office Visit from 12/26/2021 in [place] Video Visit from 10/14/2021 in [place] Video Visit from 07/19/2021 in [place] Video Visit from 04/30/2020 in [facility]  Total GAD-7 Score 0 1 0 11      PHQ2-9    [place] Visit from 12/26/2021 in [place] Video Visit from 10/14/2021 in [place] Video Visit from 07/19/2021 in [place] Video Visit from 03/21/2021 in [place] Nutrition from 05/07/2017 in [place]  PHQ-2 Total Score 0 0 0  0 0  PHQ-9 Total Score -- -- 0 -- --      Flowsheet Row ED from 10/18/2021 in [facility] ED from 07/03/2021 in [place] Video Visit from 03/21/2021 in [place] No Risk No Risk No Risk        Assessment and Plan: Evelyn Flores is a 36 year old Caucasian female, single, employed, lives in [place], has a history of MDD, GAD was evaluated in office today.  Patient is currently struggling with left foot pain, acute, will benefit from management for the same.  Otherwise reports mood symptoms and sleep are stable.  Plan as noted below.  Plan MDD in remission Zoloft 200 mg p.o. daily   GAD-stable Zoloft 200 mg p.o. daily  Insomnia-stable Continue CPAP for OSA  Patient advised to go to her provider for management of pain.  She is planning to go to [facility].  Follow-up in clinic in 3 months or sooner if needed.  This note was generated in part or whole with voice recognition software. Voice recognition is usually quite accurate but there are transcription errors that can and very often do occur. I apologize for any typographical errors that were not detected and corrected.       Ursula Alert, MD 12/27/2021, 1:37 PM

## 2021-12-27 DIAGNOSIS — M722 Plantar fascial fibromatosis: Secondary | ICD-10-CM | POA: Diagnosis not present

## 2022-01-07 DIAGNOSIS — Z3042 Encounter for surveillance of injectable contraceptive: Secondary | ICD-10-CM | POA: Diagnosis not present

## 2022-01-16 DIAGNOSIS — M722 Plantar fascial fibromatosis: Secondary | ICD-10-CM | POA: Diagnosis not present

## 2022-01-30 DIAGNOSIS — M76822 Posterior tibial tendinitis, left leg: Secondary | ICD-10-CM | POA: Diagnosis not present

## 2022-01-30 DIAGNOSIS — M722 Plantar fascial fibromatosis: Secondary | ICD-10-CM | POA: Diagnosis not present

## 2022-03-03 ENCOUNTER — Telehealth: Payer: Self-pay

## 2022-03-03 DIAGNOSIS — G4733 Obstructive sleep apnea (adult) (pediatric): Secondary | ICD-10-CM | POA: Diagnosis not present

## 2022-03-03 DIAGNOSIS — F411 Generalized anxiety disorder: Secondary | ICD-10-CM

## 2022-03-03 MED ORDER — SERTRALINE HCL 100 MG PO TABS: 200.0000 mg | ORAL_TABLET | Freq: Every day | ORAL | 0 refills | Status: DC

## 2022-03-03 NOTE — Telephone Encounter (Signed)
I have sent sertraline to pharmacy at [facility]. ?

## 2022-03-03 NOTE — Telephone Encounter (Signed)
received fax requesting a refill on the sertraline ?

## 2022-03-25 DIAGNOSIS — Z3042 Encounter for surveillance of injectable contraceptive: Secondary | ICD-10-CM | POA: Diagnosis not present

## 2022-04-03 DIAGNOSIS — G4733 Obstructive sleep apnea (adult) (pediatric): Secondary | ICD-10-CM | POA: Diagnosis not present

## 2022-04-07 ENCOUNTER — Ambulatory Visit (INDEPENDENT_AMBULATORY_CARE_PROVIDER_SITE_OTHER): Payer: BC Managed Care – PPO | Admitting: Psychiatry

## 2022-04-07 ENCOUNTER — Encounter: Payer: Self-pay | Admitting: Psychiatry

## 2022-04-07 VITALS — BP 138/76 | HR 106 | Temp 98.3°F | Wt 349.2 lb

## 2022-04-07 DIAGNOSIS — F411 Generalized anxiety disorder: Secondary | ICD-10-CM

## 2022-04-07 DIAGNOSIS — G4701 Insomnia due to medical condition: Secondary | ICD-10-CM | POA: Diagnosis not present

## 2022-04-07 DIAGNOSIS — F3342 Major depressive disorder, recurrent, in full remission: Secondary | ICD-10-CM

## 2022-04-07 NOTE — Progress Notes (Signed)
[facility] MD OP Progress Note ? ?04/07/2022 1:48 PM ?Evelyn Flores  ?MRN:  [id number] ? ?Chief Complaint:  ?Chief Complaint  ?Patient presents with  ? Follow-up : 36 year old Caucasian female with history of MDD, GAD, insomnia was evaluated for medication management.  ? ?HPI: Evelyn Flores is a 36 year old Caucasian female, single, employed, lives at [facility], has a history of MDD, GAD, insomnia was evaluated in office today. ? ?Patient today reports she is currently doing well with regards to her mood.  Denies any sadness or anxiety symptoms. ? ?Reports sleep is improving.  Currently uses CPAP regularly.  She does have a kitten who interrupts her sleep.  Otherwise she has been trying to work on her sleep hygiene. ? ?She is currently compliant on the sertraline.  Denies any side effects. ? ?Reports her left foot pain as better. ? ?Reports work as going well. ? ?Denies any suicidality, homicidality or perceptual disturbances. ? ?Denies any other concerns today. ? ?Visit Diagnosis:  ?  ICD-10-CM   ?1. MDD (major depressive disorder), recurrent, in full remission ([place])  F33.42   ?  ?2. GAD (generalized anxiety disorder)  F41.1   ?  ?3. 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.01   ? mood, sleep apnea  ?  ? ? ?Past Psychiatric History: Reviewed past psychiatric history from progress note on 03/18/2018. ? ?Past Medical History:  ?Past Medical History:  ?Diagnosis Date  ? Anxiety   ? Depression   ? DVT (deep venous thrombosis) ([place])   ? Ear infection   ?  ?Past Surgical History:  ?Procedure Laterality Date  ? CHOLECYSTECTOMY    ? VEIN SURGERY Bilateral   ? vein surgery both legs  ? ? ?Family Psychiatric History: Reviewed family psychiatric history from progress note on 03/18/2018. ? ?Family History:  ?Family History  ?Problem Relation Age of Onset  ? Panic disorder Mother   ? Dementia Paternal Grandfather   ? ? ?Social History: Reviewed social history from progress note on 03/18/2018. ?Social History  ? ?Socioeconomic History  ?  Marital status: Single  ?  Spouse name: Not on file  ? Number of children: 0  ? Years of education: Not on file  ? Highest education level: High school graduate  ?Occupational History  ? Occupation: sport endeav[employer]  ?  Comment: fulltime  ?Tobacco Use  ? Smoking status: Never  ? Smokeless tobacco: Never  ?Vaping Use  ? Vaping Use: Never used  ?Substance and Sexual Activity  ? Alcohol use: No  ?  Alcohol/week: 0.0 standard drinks  ? Drug use: No  ? Sexual activity: Yes  ?  Birth control/protection: Injection, Condom  ?Other Topics Concern  ? Not on file  ?Social History Narrative  ? Not on file  ? ?Social Determinants of Health  ? ?Financial Resource Strain: Not on file  ?Food Insecurity: Not on file  ?Transportation Needs: Not on file  ?Physical Activity: Not on file  ?Stress: Not on file  ?Social Connections: Not on file  ? ? ?Allergies:  ?Allergies  ?Allergen Reactions  ? Sulfa Antibiotics Swelling, Other (See Comments) and Hives  ? Buspirone Other (See Comments)  ?  spasms ?spasms ?spasms  ? Influenza Vaccines Other (See Comments)  ?  Very sick and was hospitalized   ? Topiramate Other (See Comments)  ?  Mental fogginess, tingling, numbness  ? ? ?Metabolic Disorder Labs: ?No results found for: HGBA1C, MPG ?No results found for: PROLACTIN ?No results found for: CHOL, TRIG,  HDL, CHOLHDL, VLDL, LDLCALC ?No results found for: TSH ? ?Therapeutic Level Labs: ?No results found for: LITHIUM ?No results found for: VALPROATE ?No components found for:  CBMZ ? ?Current Medications: ?Current Outpatient Medications  ?Medication Sig Dispense Refill  ? magnesium oxide (MAG-OX) 400 MG tablet Take by mouth.    ? medroxyPROGESTERone (DEPO-PROVERA) 150 MG/ML injection Inject 150 mg into the muscle every 3 (three) months. Last injection June 2015    ? omeprazole (PRILOSEC) 40 MG capsule Take 40 mg by mouth daily.    ? Omeprazole Magnesium (PRILOSEC PO) omeprazole    ? ondansetron (ZOFRAN ODT) 4 MG disintegrating tablet Take 1 tablet  (4 mg total) by mouth every 6 (six) hours as needed for nausea or vomiting. 20 tablet 0  ? pregabalin (LYRICA) 100 MG capsule Take 1 capsule by mouth 2 (two) times daily.    ? rizatriptan (MAXALT-MLT) 10 MG disintegrating tablet Take by mouth.    ? sertraline (ZOLOFT) 100 MG tablet Take 2 tablets (200 mg total) by mouth daily. 180 tablet 0  ? triamterene-hydrochlorothiazide (MAXZIDE-25) 37.5-25 MG tablet Take 0.5 tablets by mouth daily.  6  ? ?No current facility-administered medications for this visit.  ? ? ? ?Musculoskeletal: ?Strength & Muscle Tone: within normal limits ?Gait & Station: normal ?Patient leans: N/A ? ?Psychiatric Specialty Exam: ?Review of Systems  ?Psychiatric/Behavioral:  Negative for agitation, behavioral problems, confusion, decreased concentration, dysphoric mood, hallucinations, self-injury, sleep disturbance and suicidal ideas. The patient is not nervous/anxious and is not hyperactive.   ?All other systems reviewed and are negative.  ?Blood pressure 138/76, pulse (!) 106, temperature 98.3 ?F (36.8 ?C), temperature source Temporal, weight (!) 349 lb 3.2 oz (158.4 kg).Body mass index is 56.36 kg/m?.  ?General Appearance: Casual  ?Eye Contact:  Fair  ?Speech:  Clear and Coherent  ?Volume:  Normal  ?Mood:  Euthymic  ?Affect:  Congruent  ?Thought Process:  Goal Directed and Descriptions of Associations: Intact  ?Orientation:  Full (Time, Place, and Person)  ?Thought Content: Logical   ?Suicidal Thoughts:  No  ?Homicidal Thoughts:  No  ?Memory:  Immediate;   Fair ?Recent;   Fair ?Remote;   Fair  ?Judgement:  Fair  ?Insight:  Fair  ?Psychomotor Activity:  Normal  ?Concentration:  Concentration: Fair and Attention Span: Fair  ?Recall:  Fair  ?Fund of Knowledge: Fair  ?Language: Fair  ?Akathisia:  No  ?Handed:  Right  ?AIMS (if indicated): done  ?Assets:  Communication Skills ?Desire for Improvement ?Housing ?Social Support ?Talents/Skills  ?ADL's:  Intact  ?Cognition: WNL  ?Sleep:   improving   ? ?Screenings: ?AIMS   ? ?[place] Office Visit from 04/07/2022 in [place]  ?AIMS Total Score 0  ? ?  ? ?GAD-7   ? ?[place] Office Visit from 12/26/2021 in [place] Video Visit from 10/14/2021 in [place] Video Visit from 07/19/2021 in [place] Video Visit from 04/30/2020 in [place]  ?Total GAD-7 Score 0 1 0 11  ? ?  ? ?PHQ2-9   ? ?[place] Office Visit from 04/07/2022 in [place] Office Visit from 12/26/2021 in [place] Video Visit from 10/14/2021 in [place] Video Visit from 07/19/2021 in [place] Video Visit from 03/21/2021 in [place]  ?PHQ-2 Total Score 0 0 0 0 0  ?PHQ-9 Total Score 1 -- -- 0 --  ? ?  ? ?  [place] Office Visit from 04/07/2022 in [facility] ED from 10/18/2021 in [facility] ED from 07/03/2021 in [place]  ?C-SSRS RISK CATEGORY No Risk No Risk No Risk  ? ?  ? ? ? ?Assessment and Plan: Evelyn Flores is a 36 year old Caucasian female, single, employed, lives in [place], has a history of MDD, GAD was evaluated in office today.  Patient is currently stable.  Plan as noted below. ? ?Plan ?MDD in remission ?Zoloft 200 mg p.o. daily ? ?GAD-stable ?Zoloft 200 mg p.o. daily ? ?Insomnia-stable ?Continue CPAP for OSA ? ?Follow-up in clinic in 3 to 4 months or sooner if needed. ? ? ?This note was generated in part or whole with voice recognition software. Voice recognition is usually quite accurate but there are transcription errors that can and very often do occur. I apologize for any typographical errors that were not detected and corrected. ? ? ? ?Ursula Alert, MD ?04/07/2022, 1:48 PM ? ?

## 2022-04-15 DIAGNOSIS — G43809 Other migraine, not intractable, without status migrainosus: Secondary | ICD-10-CM | POA: Diagnosis not present

## 2022-04-15 DIAGNOSIS — G4700 Insomnia, unspecified: Secondary | ICD-10-CM | POA: Diagnosis not present

## 2022-04-15 DIAGNOSIS — G4733 Obstructive sleep apnea (adult) (pediatric): Secondary | ICD-10-CM | POA: Diagnosis not present

## 2022-04-22 DIAGNOSIS — R1013 Epigastric pain: Secondary | ICD-10-CM | POA: Diagnosis not present

## 2022-04-22 DIAGNOSIS — R1310 Dysphagia, unspecified: Secondary | ICD-10-CM | POA: Diagnosis not present

## 2022-04-22 DIAGNOSIS — Z6841 Body Mass Index (BMI) 40.0 and over, adult: Secondary | ICD-10-CM | POA: Diagnosis not present

## 2022-04-23 DIAGNOSIS — R1310 Dysphagia, unspecified: Secondary | ICD-10-CM | POA: Diagnosis not present

## 2022-04-23 DIAGNOSIS — R160 Hepatomegaly, not elsewhere classified: Secondary | ICD-10-CM | POA: Diagnosis not present

## 2022-04-23 DIAGNOSIS — Z9049 Acquired absence of other specified parts of digestive tract: Secondary | ICD-10-CM | POA: Diagnosis not present

## 2022-04-23 DIAGNOSIS — R1013 Epigastric pain: Secondary | ICD-10-CM | POA: Diagnosis not present

## 2022-04-24 DIAGNOSIS — Z882 Allergy status to sulfonamides status: Secondary | ICD-10-CM | POA: Diagnosis not present

## 2022-04-24 DIAGNOSIS — G4730 Sleep apnea, unspecified: Secondary | ICD-10-CM | POA: Diagnosis not present

## 2022-04-24 DIAGNOSIS — R1310 Dysphagia, unspecified: Secondary | ICD-10-CM | POA: Diagnosis not present

## 2022-04-24 DIAGNOSIS — Z6841 Body Mass Index (BMI) 40.0 and over, adult: Secondary | ICD-10-CM | POA: Diagnosis not present

## 2022-04-24 DIAGNOSIS — R1013 Epigastric pain: Secondary | ICD-10-CM | POA: Diagnosis not present

## 2022-04-24 DIAGNOSIS — K219 Gastro-esophageal reflux disease without esophagitis: Secondary | ICD-10-CM | POA: Diagnosis not present

## 2022-04-24 DIAGNOSIS — E669 Obesity, unspecified: Secondary | ICD-10-CM | POA: Diagnosis not present

## 2022-04-24 DIAGNOSIS — G43109 Migraine with aura, not intractable, without status migrainosus: Secondary | ICD-10-CM | POA: Diagnosis not present

## 2022-05-02 DIAGNOSIS — R197 Diarrhea, unspecified: Secondary | ICD-10-CM | POA: Diagnosis not present

## 2022-05-02 DIAGNOSIS — R1310 Dysphagia, unspecified: Secondary | ICD-10-CM | POA: Insufficient documentation

## 2022-05-02 DIAGNOSIS — R1013 Epigastric pain: Secondary | ICD-10-CM | POA: Diagnosis not present

## 2022-05-03 DIAGNOSIS — G4733 Obstructive sleep apnea (adult) (pediatric): Secondary | ICD-10-CM | POA: Diagnosis not present

## 2022-05-30 DIAGNOSIS — H8109 Meniere's disease, unspecified ear: Secondary | ICD-10-CM | POA: Diagnosis not present

## 2022-05-30 DIAGNOSIS — J301 Allergic rhinitis due to pollen: Secondary | ICD-10-CM | POA: Diagnosis not present

## 2022-05-31 ENCOUNTER — Other Ambulatory Visit: Payer: Self-pay | Admitting: Psychiatry

## 2022-05-31 DIAGNOSIS — F411 Generalized anxiety disorder: Secondary | ICD-10-CM

## 2022-06-02 DIAGNOSIS — G4733 Obstructive sleep apnea (adult) (pediatric): Secondary | ICD-10-CM | POA: Diagnosis not present

## 2022-06-03 DIAGNOSIS — G4733 Obstructive sleep apnea (adult) (pediatric): Secondary | ICD-10-CM | POA: Diagnosis not present

## 2022-06-17 DIAGNOSIS — Z3042 Encounter for surveillance of injectable contraceptive: Secondary | ICD-10-CM | POA: Diagnosis not present

## 2022-07-02 DIAGNOSIS — G4733 Obstructive sleep apnea (adult) (pediatric): Secondary | ICD-10-CM | POA: Diagnosis not present

## 2022-07-25 DIAGNOSIS — Z124 Encounter for screening for malignant neoplasm of cervix: Secondary | ICD-10-CM | POA: Diagnosis not present

## 2022-07-25 DIAGNOSIS — Z01419 Encounter for gynecological examination (general) (routine) without abnormal findings: Secondary | ICD-10-CM | POA: Diagnosis not present

## 2022-08-02 DIAGNOSIS — G4733 Obstructive sleep apnea (adult) (pediatric): Secondary | ICD-10-CM | POA: Diagnosis not present

## 2022-08-06 ENCOUNTER — Other Ambulatory Visit: Payer: Self-pay

## 2022-08-06 ENCOUNTER — Ambulatory Visit (INDEPENDENT_AMBULATORY_CARE_PROVIDER_SITE_OTHER): Payer: BC Managed Care – PPO

## 2022-08-06 ENCOUNTER — Ambulatory Visit
Admission: RE | Admit: 2022-08-06 | Discharge: 2022-08-06 | Disposition: A | Discharge status: Home / Self Care | Payer: BC Managed Care – PPO | Source: Ambulatory Visit | Attending: Emergency Medicine | Admitting: Emergency Medicine

## 2022-08-06 VITALS — BP 131/79 | HR 86 | Temp 98.1°F | Resp 20 | Ht 66.0 in | Wt 349.2 lb

## 2022-08-06 DIAGNOSIS — J069 Acute upper respiratory infection, unspecified: Secondary | ICD-10-CM | POA: Diagnosis not present

## 2022-08-06 DIAGNOSIS — R197 Diarrhea, unspecified: Secondary | ICD-10-CM | POA: Diagnosis not present

## 2022-08-06 DIAGNOSIS — E876 Hypokalemia: Secondary | ICD-10-CM | POA: Diagnosis not present

## 2022-08-06 DIAGNOSIS — Z20822 Contact with and (suspected) exposure to covid-19: Secondary | ICD-10-CM | POA: Diagnosis not present

## 2022-08-06 DIAGNOSIS — R059 Cough, unspecified: Secondary | ICD-10-CM | POA: Diagnosis not present

## 2022-08-06 DIAGNOSIS — R0602 Shortness of breath: Secondary | ICD-10-CM | POA: Diagnosis not present

## 2022-08-06 LAB — CBC WITH DIFFERENTIAL/PLATELET
Abs Immature Granulocytes: 0.03 10*3/uL (ref 0.00–0.07)
Basophils Absolute: 0.1 10*3/uL (ref 0.0–0.1)
Basophils Relative: 1 %
Eosinophils Absolute: 0.1 10*3/uL (ref 0.0–0.5)
Eosinophils Relative: 2 %
HCT: 40.9 % (ref 36.0–46.0)
Hemoglobin: 13.5 g/dL (ref 12.0–15.0)
Immature Granulocytes: 0 %
Lymphocytes Relative: 41 %
Lymphs Abs: 3.2 10*3/uL (ref 0.7–4.0)
MCH: 27.6 pg (ref 26.0–34.0)
MCHC: 33 g/dL (ref 30.0–36.0)
MCV: 83.5 fL (ref 80.0–100.0)
Monocytes Absolute: 0.5 10*3/uL (ref 0.1–1.0)
Monocytes Relative: 7 %
Neutro Abs: 3.8 10*3/uL (ref 1.7–7.7)
Neutrophils Relative %: 49 %
Platelets: 278 10*3/uL (ref 150–400)
RBC: 4.9 MIL/uL (ref 3.87–5.11)
RDW: 14 % (ref 11.5–15.5)
WBC: 7.7 10*3/uL (ref 4.0–10.5)
nRBC: 0 % (ref 0.0–0.2)

## 2022-08-06 LAB — COMPREHENSIVE METABOLIC PANEL
ALT: 27 U/L (ref 0–44)
AST: 22 U/L (ref 15–41)
Albumin: 4.1 g/dL (ref 3.5–5.0)
Alkaline Phosphatase: 57 U/L (ref 38–126)
Anion gap: 7 (ref 5–15)
BUN: 12 mg/dL (ref 6–20)
CO2: 26 mmol/L (ref 22–32)
Calcium: 9.6 mg/dL (ref 8.9–10.3)
Chloride: 105 mmol/L (ref 98–111)
Creatinine, Ser: 0.9 mg/dL (ref 0.44–1.00)
GFR, Estimated: >60 mL/min (ref >60)
Glucose, Bld: 95 mg/dL (ref 70–99)
Potassium: 3.1 mmol/L — ABNORMAL LOW (ref 3.5–5.1)
Sodium: 138 mmol/L (ref 135–145)
Total Bilirubin: 0.3 mg/dL (ref 0.3–1.2)
Total Protein: 8.3 g/dL — ABNORMAL HIGH (ref 6.5–8.1)

## 2022-08-06 LAB — RESP PANEL BY RT-PCR (FLU A&B, COVID) ARPGX2
Influenza A by PCR: NEGATIVE
Influenza B by PCR: NEGATIVE
SARS Coronavirus 2 by RT PCR: NEGATIVE

## 2022-08-06 MED ORDER — IBUPROFEN 600 MG PO TABS: 600.0000 mg | ORAL_TABLET | Freq: Four times a day (QID) | ORAL | 0 refills | Status: AC | PRN

## 2022-08-06 MED ORDER — IPRATROPIUM-ALBUTEROL 0.5-2.5 (3) MG/3ML IN SOLN
3.0000 mL | Freq: Once | RESPIRATORY_TRACT | Status: AC
  Administered 2022-08-06: 3 mL via RESPIRATORY_TRACT

## 2022-08-06 MED ORDER — ALBUTEROL SULFATE HFA 108 (90 BASE) MCG/ACT IN AERS: 1.0000 | INHALATION_SPRAY | RESPIRATORY_TRACT | 0 refills | Status: DC | PRN

## 2022-08-06 MED ORDER — PREDNISONE 20 MG PO TABS
40.0000 mg | ORAL_TABLET | Freq: Every day | ORAL | 0 refills | Status: AC
Start: 2022-08-06 — End: 2022-08-11

## 2022-08-06 MED ORDER — ONDANSETRON 8 MG PO TBDP: ORAL_TABLET | ORAL | 0 refills | Status: DC

## 2022-08-06 MED ORDER — AEROCHAMBER MV MISC: 1 refills | Status: DC

## 2022-08-06 MED ORDER — PROMETHAZINE-DM 6.25-15 MG/5ML PO SYRP: 5.0000 mL | ORAL_SOLUTION | Freq: Four times a day (QID) | ORAL | 0 refills | Status: DC | PRN

## 2022-08-06 NOTE — ED Triage Notes (Signed)
Pt c/o cough, fatigue, light headed, headaches, chills, diarrhea. Started about 5 days ago. She states she did a home test and was negative. Denies fever.

## 2022-08-06 NOTE — ED Provider Notes (Signed)
HPI  SUBJECTIVE:  Evelyn Flores is a 36 y.o. female who presents with cough, fatigue, 3 episodes of watery, nonbloody diarrhea per day for the past 5 days starting several days after attending a concert.  She reports body aches, headaches, dry cough, shortness of breath, increased work of breathing, wheezing, dyspnea on exertion.  She reports nausea yesterday, which has since resolved.  She reports right upper quadrant, crampy abdominal pain immediately before having a bowel movement, and it resolves afterwards.  It is not present at any other time.  No nasal congestion, rhinorrhea, sinus pain or pressure, sore throat, vomiting, change in urine output.  No neck stiffness, photophobia.  She never regained her sense of smell and taste after having COVID in 2021.  No known COVID or flu exposure.  She got 4-5 COVID vaccines.  She is able to sleep at night without waking up coughing.  No antibiotics in the past month.  No antipyretic in the past 6 hours.  She has tried NyQuil, rest, increasing fluids and her Maxzide for headache with improvement in her symptoms.  She has a past medical history of PCOS, provoked DVT not on any anticoagulants currently, migraine, lymphedema, and is status postcholecystectomy.  She has no history of pulmonary disease, diabetes, hypertension, smoking.  LMP: Irregular.  On Depo.  Denies the possibility of being pregnant.  PCP: [facility] [place].    Past Medical History:  Diagnosis Date   Anxiety    Depression    DVT (deep venous thrombosis) (HCC)    Ear infection     Past Surgical History:  Procedure Laterality Date   CHOLECYSTECTOMY     VEIN SURGERY Bilateral    vein surgery both legs    Family History  Problem Relation Age of Onset   Panic disorder Mother    Dementia Paternal Grandfather     Social History   Tobacco Use   Smoking status: Never   Smokeless tobacco: Never  Vaping Use   Vaping Use: Never used  Substance Use Topics   Alcohol use:  No    Alcohol/week: 0.0 standard drinks of alcohol   Drug use: No    No current facility-administered medications for this encounter.  Current Outpatient Medications:    albuterol (VENTOLIN HFA) 108 (90 Base) MCG/ACT inhaler, Inhale 1-2 puffs into the lungs every 4 (four) hours as needed for wheezing or shortness of breath., Disp: 1 each, Rfl: 0   ibuprofen (ADVIL) 600 MG tablet, Take 1 tablet (600 mg total) by mouth every 6 (six) hours as needed., Disp: 30 tablet, Rfl: 0   magnesium oxide (MAG-OX) 400 MG tablet, Take by mouth., Disp: , Rfl:    medroxyPROGESTERone (DEPO-PROVERA) 150 MG/ML injection, Inject 150 mg into the muscle every 3 (three) months. Last injection June 2015, Disp: , Rfl:    omeprazole (PRILOSEC) 40 MG capsule, Take 40 mg by mouth daily., Disp: , Rfl:    ondansetron (ZOFRAN-ODT) 8 MG disintegrating tablet, 1/2- 1 tablet q 8 hr prn nausea, vomiting, Disp: 20 tablet, Rfl: 0   predniSONE (DELTASONE) 20 MG tablet, Take 2 tablets (40 mg total) by mouth daily with breakfast for 5 days., Disp: 10 tablet, Rfl: 0   pregabalin (LYRICA) 100 MG capsule, Take 1 capsule by mouth 2 (two) times daily., Disp: , Rfl:    promethazine-dextromethorphan (PROMETHAZINE-DM) 6.25-15 MG/5ML syrup, Take 5 mLs by mouth 4 (four) times daily as needed for cough., Disp: 118 mL, Rfl: 0   rizatriptan (MAXALT-MLT) 10  MG disintegrating tablet, Take by mouth., Disp: , Rfl:    sertraline (ZOLOFT) 100 MG tablet, TAKE TWO TABLETS BY MOUTH DAILY, Disp: 180 tablet, Rfl: 0   Spacer/Aero-Holding Chambers (AEROCHAMBER MV) inhaler, Use as instructed, Disp: 1 each, Rfl: 1   triamterene-hydrochlorothiazide (MAXZIDE-25) 37.5-25 MG tablet, Take 0.5 tablets by mouth daily., Disp: , Rfl: 6   Omeprazole Magnesium (PRILOSEC PO), omeprazole, Disp: , Rfl:   Allergies  Allergen Reactions   Sulfa Antibiotics Swelling, Other (See Comments) and Hives   Buspirone Other (See Comments)    spasms spasms spasms   Influenza Vaccines  Other (See Comments)    Very sick and was hospitalized    Topiramate Other (See Comments)    Mental fogginess, tingling, numbness     ROS  As noted in HPI.   Physical Exam  BP 131/79 (BP Location: Left Arm)   Pulse 86   Temp 98.1 F (36.7 C) (Oral)   Resp 20   Ht '5\' 6"'$  (1.676 m)   Wt (!) 158.4 kg   SpO2 95%   BMI 56.36 kg/m   Constitutional: Well developed, well nourished, no acute distress.  Appears ill. Eyes: PERRL, EOMI, conjunctiva normal bilaterally HENT: Normocephalic, atraumatic,mucus membranes moist.  No nasal congestion.  No maxillary, frontal sinus tenderness.  Normal tonsils. Neck: No cervical lymphadenopathy Respiratory: Fair air movement, clear to auscultation bilaterally, no rales, no wheezing, no rhonchi.  No anterior, lateral chest wall tenderness Cardiovascular: Normal rate and rhythm, no murmurs, no gallops, no rubs.  Cap refill less than 2 seconds GI: Soft, nondistended, normal bowel sounds, mild right upper quadrant tenderness, no rebound, no guarding Back: no CVAT skin: No rash, skin intact Musculoskeletal: No edema, no tenderness, no deformities Neurologic: Alert & oriented x 3, CN III-XII grossly intact, no motor deficits, sensation grossly intact Psychiatric: Speech and behavior appropriate   ED Course   Medications  ipratropium-albuterol (DUONEB) 0.5-2.5 (3) MG/3ML nebulizer solution 3 mL (3 mLs Nebulization Given 08/06/22 1517)    Orders Placed This Encounter  Procedures   Resp Panel by RT-PCR (Flu A&B, Covid) Anterior Nasal Swab    Standing Status:   Standing    Number of Occurrences:   1   DG Chest 2 View    Standing Status:   Standing    Number of Occurrences:   1    Order Specific Question:   Reason for Exam (SYMPTOM  OR DIAGNOSIS REQUIRED)    Answer:   Cough, shortness of breath, rule out pneumonia, pulmonary edema, pleural effusion   CBC with Differential    Standing Status:   Standing    Number of Occurrences:   1    Comprehensive metabolic panel    Standing Status:   Standing    Number of Occurrences:   1   Airborne and Contact precautions    Standing Status:   Standing    Number of Occurrences:   1   Results for orders placed or performed during the hospital encounter of 08/06/22 (from the past 24 hour(s))  Resp Panel by RT-PCR (Flu A&B, Covid) Anterior Nasal Swab     Status: None   Collection Time: 08/06/22  2:29 PM   Specimen: Anterior Nasal Swab  Result Value Ref Range   SARS Coronavirus 2 by RT PCR NEGATIVE NEGATIVE   Influenza A by PCR NEGATIVE NEGATIVE   Influenza B by PCR NEGATIVE NEGATIVE  CBC with Differential     Status: None   Collection Time: 08/06/22  3:19 PM  Result Value Ref Range   WBC 7.7 4.0 - 10.5 K/uL   RBC 4.90 3.87 - 5.11 MIL/uL   Hemoglobin 13.5 12.0 - 15.0 g/dL   HCT 40.9 36.0 - 46.0 %   MCV 83.5 80.0 - 100.0 fL   MCH 27.6 26.0 - 34.0 pg   MCHC 33.0 30.0 - 36.0 g/dL   RDW 14.0 11.5 - 15.5 %   Platelets 278 150 - 400 K/uL   nRBC 0.0 0.0 - 0.2 %   Neutrophils Relative % 49 %   Neutro Abs 3.8 1.7 - 7.7 K/uL   Lymphocytes Relative 41 %   Lymphs Abs 3.2 0.7 - 4.0 K/uL   Monocytes Relative 7 %   Monocytes Absolute 0.5 0.1 - 1.0 K/uL   Eosinophils Relative 2 %   Eosinophils Absolute 0.1 0.0 - 0.5 K/uL   Basophils Relative 1 %   Basophils Absolute 0.1 0.0 - 0.1 K/uL   Immature Granulocytes 0 %   Abs Immature Granulocytes 0.03 0.00 - 0.07 K/uL  Comprehensive metabolic panel     Status: Abnormal   Collection Time: 08/06/22  3:19 PM  Result Value Ref Range   Sodium 138 135 - 145 mmol/L   Potassium 3.1 (L) 3.5 - 5.1 mmol/L   Chloride 105 98 - 111 mmol/L   CO2 26 22 - 32 mmol/L   Glucose, Bld 95 70 - 99 mg/dL   BUN 12 6 - 20 mg/dL   Creatinine, Ser 0.90 0.44 - 1.00 mg/dL   Calcium 9.6 8.9 - 10.3 mg/dL   Total Protein 8.3 (H) 6.5 - 8.1 g/dL   Albumin 4.1 3.5 - 5.0 g/dL   AST 22 15 - 41 U/L   ALT 27 0 - 44 U/L   Alkaline Phosphatase 57 38 - 126 U/L   Total  Bilirubin 0.3 0.3 - 1.2 mg/dL   GFR, Estimated >60 >60 mL/min   Anion gap 7 5 - 15   DG Chest 2 View  Result Date: 08/06/2022 CLINICAL DATA:  Cough, shortness of breath EXAM: CHEST - 2 VIEW COMPARISON:  None Available. FINDINGS: The heart size and mediastinal contours are within normal limits. Both lungs are clear. The visualized skeletal structures are unremarkable. IMPRESSION: No acute abnormality of the lungs. Electronically Signed   By: Delanna Ahmadi M.D.   On: 08/06/2022 15:13    ED Clinical Impression  1. Viral URI with cough   2. Diarrhea, unspecified type   3. Hypokalemia due to excessive gastrointestinal loss of potassium      ED Assessment/Plan  Checking COVID, flu, chest x-ray.  She will be a candidate for antivirals if COVID is positive due to BMI above 30.  Also checking a CBC, CMP because of the right upper quadrant tenderness.  She is status postcholecystectomy.  Abdomen is otherwise benign.  She appears well-hydrated.  She is reporting shortness of breath, states it feels as if she is "breathing through a cotton ball", so we will try a DuoNeb to see if that helps.  Will reevaluate.  Reviewed imaging independently.  No acute cardiopulmonary disease.  See radiology report for full details.  COVID, influenza negative.  Chest x-ray negative for any acute process.  CBC normal.  CMP remarkable for mild hypokalemia, but this is to be expected from diarrhea.  She states that she has had issues with this in the past and currently takes a multivitamin and potassium supplement.  On reevaluation post neb, patient states she feels a  little bit better.  She has improved air movement.  Her lungs are still clear.  Will treat supportively with regularly scheduled albuterol for the next 4 days, prednisone 40 mg for 5 days, Zofran, Tylenol/ibuprofen, Promethazine DM.  Push electrolyte containing fluids.  Continue potassium supplementation.  Follow-up with PCP if not improving.  ER return  precautions given.  2-day work note.   Discussed labs, imaging, MDM, treatment plan, and plan for follow-up with patient Discussed sn/sx that should prompt return to the ED. patient agrees with plan.   Meds ordered this encounter  Medications   ipratropium-albuterol (DUONEB) 0.5-2.5 (3) MG/3ML nebulizer solution 3 mL   albuterol (VENTOLIN HFA) 108 (90 Base) MCG/ACT inhaler    Sig: Inhale 1-2 puffs into the lungs every 4 (four) hours as needed for wheezing or shortness of breath.    Dispense:  1 each    Refill:  0   Spacer/Aero-Holding Chambers (AEROCHAMBER MV) inhaler    Sig: Use as instructed    Dispense:  1 each    Refill:  1   predniSONE (DELTASONE) 20 MG tablet    Sig: Take 2 tablets (40 mg total) by mouth daily with breakfast for 5 days.    Dispense:  10 tablet    Refill:  0   ibuprofen (ADVIL) 600 MG tablet    Sig: Take 1 tablet (600 mg total) by mouth every 6 (six) hours as needed.    Dispense:  30 tablet    Refill:  0   ondansetron (ZOFRAN-ODT) 8 MG disintegrating tablet    Sig: 1/2- 1 tablet q 8 hr prn nausea, vomiting    Dispense:  20 tablet    Refill:  0   promethazine-dextromethorphan (PROMETHAZINE-DM) 6.25-15 MG/5ML syrup    Sig: Take 5 mLs by mouth 4 (four) times daily as needed for cough.    Dispense:  118 mL    Refill:  0      *This clinic note was created using [employer]. Therefore, there may be occasional mistakes despite careful proofreading. ?    Melynda Ripple, MD 08/06/22 [id number]

## 2022-08-06 NOTE — Discharge Instructions (Addendum)
2 puffs from your albuterol inhaler using your spacer every 4 hours for 2 days, then every 6 hours for 2 days, then as needed.  You can back off on the albuterol if you start to improve sooner.  Finish the prednisone, unless a healthcare provider tells you to stop.  Promethazine DM as needed for cough.  Take 600 mg of ibuprofen combined with 1000 mg of Tylenol 3 or 4 times a day as needed for body aches, headaches.  Increase your intake of potassium rich foods and continue your potassium supplementation.

## 2022-08-07 ENCOUNTER — Ambulatory Visit: Payer: BC Managed Care – PPO | Admitting: Psychiatry

## 2022-08-22 ENCOUNTER — Ambulatory Visit (INDEPENDENT_AMBULATORY_CARE_PROVIDER_SITE_OTHER): Payer: BC Managed Care – PPO | Admitting: Psychiatry

## 2022-08-22 ENCOUNTER — Encounter: Payer: Self-pay | Admitting: Psychiatry

## 2022-08-22 VITALS — BP 142/80 | HR 88 | Temp 98.3°F | Wt 354.6 lb

## 2022-08-22 DIAGNOSIS — F3342 Major depressive disorder, recurrent, in full remission: Secondary | ICD-10-CM | POA: Diagnosis not present

## 2022-08-22 DIAGNOSIS — G4701 Insomnia due to medical condition: Secondary | ICD-10-CM

## 2022-08-22 DIAGNOSIS — F411 Generalized anxiety disorder: Secondary | ICD-10-CM | POA: Diagnosis not present

## 2022-08-22 MED ORDER — SERTRALINE HCL 100 MG PO TABS: 200.0000 mg | ORAL_TABLET | Freq: Every day | ORAL | 1 refills | Status: DC

## 2022-08-22 NOTE — Patient Instructions (Signed)
Ozempic, [id number]

## 2022-08-22 NOTE — Progress Notes (Signed)
[place] MD OP Progress Note  08/22/2022 9:03 AM Evelyn Flores  MRN:  [id number]  Chief Complaint:  Chief Complaint  Patient presents with   Follow-up: 36 year old Caucasian female with history of depression, anxiety, presented for medication management.   HPI: Evelyn Flores is a 36 year old Caucasian female, single, employed, lives at [facility], has a history of MDD, GAD, insomnia was evaluated in office today.  Patient today reports she does have situational stressors at work.  She however has been coping okay.  She is currently looking for new jobs.  Reports she continues to have chronic pain especially of the left leg.  It comes and goes.  She has not been exercising much because of knee pain, however is planning to get back into the routine again.  Patient reports mood otherwise is good.  Denies any significant depression or anxiety.  Patient reports sleep is good, currently compliant on CPAP.  Since she has been on the CPAP, energy level has been good during the day.  Patient denies any suicidality, homicidality or perceptual disturbances.  Patient denies any other concerns today.  Visit Diagnosis:    ICD-10-CM   1. MDD (major depressive disorder), recurrent, in full remission ([place])  F33.42     2. GAD (generalized anxiety disorder)  F41.1 sertraline (ZOLOFT) 100 MG tablet    3. 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.01    mood, sleep apnea      Past Psychiatric History: Reviewed past psychiatric history from progress note on 03/18/2018.  Past Medical History:  Past Medical History:  Diagnosis Date   Anxiety    Depression    DVT (deep venous thrombosis) (HCC)    Ear infection     Past Surgical History:  Procedure Laterality Date   CHOLECYSTECTOMY     VEIN SURGERY Bilateral    vein surgery both legs    Family Psychiatric History: Reviewed family psychiatric history from progress note on 03/18/2018.  Family History:  Family History  Problem Relation Age of Onset    Panic disorder Mother    Dementia Paternal Grandfather     Social History: Reviewed social history from progress note on 03/18/2018. Social History   Socioeconomic History   Marital status: Single    Spouse name: Not on file   Number of children: 0   Years of education: Not on file   Highest education level: High school graduate  Occupational History   Occupation: [employer]    Comment: fulltime  Tobacco Use   Smoking status: Never   Smokeless tobacco: Never  Vaping Use   Vaping Use: Never used  Substance and Sexual Activity   Alcohol use: No    Alcohol/week: 0.0 standard drinks of alcohol   Drug use: No   Sexual activity: Yes    Birth control/protection: Injection, Condom  Other Topics Concern   Not on file  Social History Narrative   Not on file   Social Determinants of Health   Financial Resource Strain: Low Risk  (11/10/2017)   Overall Financial Resource Strain (CARDIA)    Difficulty of Paying Living Expenses: Not hard at all  Food Insecurity: No Food Insecurity (11/10/2017)   Hunger Vital Sign    Worried About Running Out of Food in the Last Year: Never true    [place] in the Last Year: Never true  Transportation Needs: No Transportation Needs (11/10/2017)   PRAPARE - Hydrologist (Medical): No  Lack of Transportation (Non-Medical): No  Physical Activity: Insufficiently Active (11/10/2017)   Exercise Vital Sign    Days of Exercise per Week: 3 days    Minutes of Exercise per Session: 30 min  Stress: Stress Concern Present (11/10/2017)   [place]    Feeling of Stress : Very much  Social Connections: Socially Isolated (11/10/2017)   Social Connection and Isolation Panel [NHANES]    Frequency of Communication with Friends and Family: Once a week    Frequency of Social Gatherings with Friends and Family: Once a week    Attends Religious Services:  Never    Marine scientist or Organizations: No    Attends Archivist Meetings: Never    Marital Status: Never married    Allergies:  Allergies  Allergen Reactions   Misc. Sulfonamide Containing Compounds Other (See Comments) and Hives   Sulfa Antibiotics Swelling, Other (See Comments) and Hives   Buspirone Other (See Comments)    spasms spasms spasms   Influenza Vaccines Other (See Comments)    Very sick and was hospitalized    Topiramate Other (See Comments)    Mental fogginess, tingling, numbness    Metabolic Disorder Labs: No results found for: "HGBA1C", "MPG" No results found for: "PROLACTIN" No results found for: "CHOL", "TRIG", "HDL", "CHOLHDL", "VLDL", "LDLCALC" No results found for: "TSH"  Therapeutic Level Labs: No results found for: "LITHIUM" No results found for: "VALPROATE" No results found for: "CBMZ"  Current Medications: Current Outpatient Medications  Medication Sig Dispense Refill   albuterol (VENTOLIN HFA) 108 (90 Base) MCG/ACT inhaler Inhale 1-2 puffs into the lungs every 4 (four) hours as needed for wheezing or shortness of breath. 1 each 0   ibuprofen (ADVIL) 600 MG tablet Take 1 tablet (600 mg total) by mouth every 6 (six) hours as needed. 30 tablet 0   magnesium oxide (MAG-OX) 400 MG tablet Take by mouth.     medroxyPROGESTERone (DEPO-PROVERA) 150 MG/ML injection Inject 150 mg into the muscle every 3 (three) months. Last injection June 2015     omeprazole (PRILOSEC) 40 MG capsule Take 40 mg by mouth daily.     ondansetron (ZOFRAN-ODT) 8 MG disintegrating tablet 1/2- 1 tablet q 8 hr prn nausea, vomiting 20 tablet 0   pregabalin (LYRICA) 100 MG capsule Take 1 capsule by mouth 2 (two) times daily.     rizatriptan (MAXALT-MLT) 10 MG disintegrating tablet Take by mouth.     Spacer/Aero-Holding Chambers (AEROCHAMBER MV) inhaler Use as instructed 1 each 1   triamterene-hydrochlorothiazide (MAXZIDE-25) 37.5-25 MG tablet Take 0.5 tablets by  mouth daily.  6   promethazine-dextromethorphan (PROMETHAZINE-DM) 6.25-15 MG/5ML syrup Take 5 mLs by mouth 4 (four) times daily as needed for cough. (Patient not taking: Reported on 08/22/2022) 118 mL 0   sertraline (ZOLOFT) 100 MG tablet Take 2 tablets (200 mg total) by mouth daily. 180 tablet 1   No current facility-administered medications for this visit.     Musculoskeletal: Strength & Muscle Tone: within normal limits Gait & Station: normal Patient leans: N/A  Psychiatric Specialty Exam: Review of Systems  Musculoskeletal:        Chronic left leg pain  Psychiatric/Behavioral: Negative.    All other systems reviewed and are negative.   Blood pressure (!) 142/80, pulse 88, temperature 98.3 F (36.8 C), temperature source Temporal, weight (!) 354 lb 9.6 oz (160.8 kg).Body mass index is 57.23 kg/m.  General Appearance: Casual  Eye  Contact:  Fair  Speech:  Clear and Coherent  Volume:  Normal  Mood:  Euthymic  Affect:  Congruent  Thought Process:  Goal Directed and Descriptions of Associations: Intact  Orientation:  Full (Time, Place, and Person)  Thought Content: Logical   Suicidal Thoughts:  No  Homicidal Thoughts:  No  Memory:  Immediate;   Fair Recent;   Fair Remote;   Fair  Judgement:  Fair  Insight:  Fair  Psychomotor Activity:  Normal  Concentration:  Concentration: Fair and Attention Span: Fair  Recall:  [employer] of Knowledge: Fair  Language: Fair  Akathisia:  No  Handed:  Right  AIMS (if indicated): not done  Assets:  Communication Skills Desire for Improvement Housing Talents/Skills Transportation  ADL's:  Intact  Cognition: WNL  Sleep:  Fair   Screenings: [place] Office Visit from 04/07/2022 in [facility] Total Score 0      [place] Visit from 08/22/2022 in [place] Office Visit from 12/26/2021 in [place] [facility]  Visit from 10/14/2021 in [place] [facility] Visit from 07/19/2021 in [place] [facility] Visit from 04/30/2020 in [place]  Total GAD-7 Score 2 0 1 0 11      PHQ2-9    [place] Visit from 08/22/2022 in [place] Visit from 04/07/2022 in [place] Visit from 12/26/2021 in [place] [facility] Visit from 10/14/2021 in [place] [facility] Visit from 07/19/2021 in [place]  PHQ-2 Total Score 1 0 0 0 0  PHQ-9 Total Score 4 1 -- -- 0      [place] Office Visit from 08/22/2022 in [facility] ED from 08/06/2022 in [facility] from 04/07/2022 in [place] No Risk No Risk No Risk        Assessment and Plan: Evelyn Flores is a 36 year old Caucasian female, single, employed, lives in [place] has a history of MDD, GAD was evaluated in office today.  Patient is currently stable.  Plan MDD in remission Zoloft 200 mg p.o. daily  GAD-stable Zoloft 200 mg p.o. daily  Insomnia-stable Continue CPAP for OSA  Patient advised to make lifestyle changes, diet management, exercise for weight loss.  Patient to monitor her blood pressure, she did not take her blood pressure medications this morning prior to coming in.  Agrees to monitor, follow up with primary care provider as needed.  Follow-up in clinic in 4 months or sooner if needed.  This note was generated in part or whole with voice recognition software. Voice recognition is usually quite accurate but there are transcription errors that can and very often do occur. I apologize for any typographical errors that were not detected and corrected.     Ursula Alert, MD 08/22/2022,  9:03 AM

## 2022-08-31 DIAGNOSIS — G4733 Obstructive sleep apnea (adult) (pediatric): Secondary | ICD-10-CM | POA: Diagnosis not present

## 2022-09-02 DIAGNOSIS — Z6841 Body Mass Index (BMI) 40.0 and over, adult: Secondary | ICD-10-CM | POA: Diagnosis not present

## 2022-09-02 DIAGNOSIS — K219 Gastro-esophageal reflux disease without esophagitis: Secondary | ICD-10-CM | POA: Diagnosis not present

## 2022-09-02 DIAGNOSIS — Z0000 Encounter for general adult medical examination without abnormal findings: Secondary | ICD-10-CM | POA: Diagnosis not present

## 2022-09-03 DIAGNOSIS — G4733 Obstructive sleep apnea (adult) (pediatric): Secondary | ICD-10-CM | POA: Diagnosis not present

## 2022-09-04 DIAGNOSIS — Z3042 Encounter for surveillance of injectable contraceptive: Secondary | ICD-10-CM | POA: Diagnosis not present

## 2022-09-29 DIAGNOSIS — M2012 Hallux valgus (acquired), left foot: Secondary | ICD-10-CM | POA: Diagnosis not present

## 2022-09-29 DIAGNOSIS — M19071 Primary osteoarthritis, right ankle and foot: Secondary | ICD-10-CM | POA: Diagnosis not present

## 2022-09-29 DIAGNOSIS — M216X9 Other acquired deformities of unspecified foot: Secondary | ICD-10-CM | POA: Diagnosis not present

## 2022-09-29 DIAGNOSIS — M2141 Flat foot [pes planus] (acquired), right foot: Secondary | ICD-10-CM | POA: Diagnosis not present

## 2022-09-29 DIAGNOSIS — M79671 Pain in right foot: Secondary | ICD-10-CM | POA: Diagnosis not present

## 2022-09-30 DIAGNOSIS — G4733 Obstructive sleep apnea (adult) (pediatric): Secondary | ICD-10-CM | POA: Diagnosis not present

## 2022-10-03 DIAGNOSIS — Z23 Encounter for immunization: Secondary | ICD-10-CM | POA: Diagnosis not present

## 2022-10-05 NOTE — Progress Notes (Unsigned)
MRN : [id number]  Evelyn Flores is a 36 y.o. (1986/11/14) female who presents with chief complaint of legs swell.  History of Present Illness:   The patient returns to the office for followup evaluation regarding leg swelling.  The swelling has persisted but with the lymph pump is much, much better controlled. The pain associated with swelling is essentially eliminated. There have not been any interval development of a ulcerations or wounds.   She appears to have had an episode of STP that has largely resolved.   The patient denies problems with the pump, noting it is working well and the leggings are in good condition.   Since the previous visit the patient has been wearing graduated compression stockings and using the lymph pump on a routine basis and  has noted significant improvement in the lymphedema.    Patient stated the lymph pump has been a very positive factor in her care.     No outpatient medications have been marked as taking for the 10/06/22 encounter (Appointment) with [name], Dolores Lory, MD.    Past Medical History:  Diagnosis Date   Anxiety    Depression    DVT (deep venous thrombosis) ([place])    Ear infection     Past Surgical History:  Procedure Laterality Date   CHOLECYSTECTOMY     VEIN SURGERY Bilateral    vein surgery both legs    Social History Social History   Tobacco Use   Smoking status: Never   Smokeless tobacco: Never  Vaping Use   Vaping Use: Never used  Substance Use Topics   Alcohol use: No    Alcohol/week: 0.0 standard drinks of alcohol   Drug use: No    Family History Family History  Problem Relation Age of Onset   Panic disorder Mother    Dementia Paternal Grandfather     Allergies  Allergen Reactions   Misc. Sulfonamide Containing Compounds Other (See Comments) and Hives   Sulfa Antibiotics Swelling, Other (See Comments) and Hives   Buspirone Other (See Comments)    spasms spasms spasms   Influenza Vaccines  Other (See Comments)    Very sick and was hospitalized    Topiramate Other (See Comments)    Mental fogginess, tingling, numbness     REVIEW OF SYSTEMS (Negative unless checked)  Constitutional: '[]'$ Weight loss  '[]'$ Fever  '[]'$ Chills Cardiac: '[]'$ Chest pain   '[]'$ Chest pressure   '[]'$ Palpitations   '[]'$ Shortness of breath when laying flat   '[]'$ Shortness of breath with exertion. Vascular:  '[]'$ Pain in legs with walking   '[x]'$ Pain in legs with standing  '[x]'$ History of DVT   '[]'$ Phlebitis   '[x]'$ Swelling in legs   '[]'$ Varicose veins   '[]'$ Non-healing ulcers Pulmonary:   '[]'$ Uses home oxygen   '[]'$ Productive cough   '[]'$ Hemoptysis   '[]'$ Wheeze  '[]'$ COPD   '[]'$ Asthma Neurologic:  '[]'$ Dizziness   '[]'$ Seizures   '[]'$ History of stroke   '[]'$ History of TIA  '[]'$ Aphasia   '[]'$ Vissual changes   '[]'$ Weakness or numbness in arm   '[]'$ Weakness or numbness in leg Musculoskeletal:   '[]'$ Joint swelling   '[]'$ Joint pain   '[]'$ Low back pain Hematologic:  '[]'$ Easy bruising  '[]'$ Easy bleeding   '[]'$ Hypercoagulable state   '[]'$ Anemic Gastrointestinal:  '[]'$ Diarrhea   '[]'$ Vomiting  '[x]'$ Gastroesophageal reflux/heartburn   '[]'$ Difficulty swallowing. Genitourinary:  '[]'$ Chronic kidney disease   '[]'$ Difficult urination  '[]'$ Frequent urination   '[]'$ Blood in urine Skin:  '[]'$ Rashes   '[]'$ Ulcers  Psychological:  '[]'$ History of anxiety   '[]'$  History of  major depression.  Physical Examination  There were no vitals filed for this visit. There is no height or weight on file to calculate BMI. Gen: WD/WN, NAD Head: [place]/AT, No temporalis wasting.  Ear/Nose/Throat: Hearing grossly intact, nares w/o erythema or drainage, pinna without lesions Eyes: PER, EOMI, sclera nonicteric.  Neck: Supple, no gross masses.  No JVD.  Pulmonary:  Good air movement, no audible wheezing, no use of accessory muscles.  Cardiac: RRR, precordium not hyperdynamic. Vascular:  scattered varicosities present bilaterally.  Mild venous stasis changes to the legs bilaterally.  3-4+ soft pitting edema, CEAP [id number]  Vessel Right Left   Radial Palpable Palpable  Gastrointestinal: soft, non-distended. No guarding/no peritoneal signs.  Musculoskeletal: M/S 5/5 throughout.  No deformity.  Neurologic: CN 2-12 intact. Pain and light touch intact in extremities.  Symmetrical.  Speech is fluent. Motor exam as listed above. Psychiatric: Judgment intact, Mood & affect appropriate for pt's clinical situation. Dermatologic: Venous rashes no ulcers noted.  No changes consistent with cellulitis. Lymph : No lichenification or skin changes of chronic lymphedema.  CBC Lab Results  Component Value Date   WBC 7.7 08/06/2022   HGB 13.5 08/06/2022   HCT 40.9 08/06/2022   MCV 83.5 08/06/2022   PLT 278 08/06/2022    BMET    Component Value Date/Time   NA 138 08/06/2022 1519   NA 140 12/15/2014 2220   K 3.1 (L) 08/06/2022 1519   K 3.6 12/15/2014 2220   CL 105 08/06/2022 1519   CL 106 12/15/2014 2220   CO2 26 08/06/2022 1519   CO2 29 12/15/2014 2220   GLUCOSE 95 08/06/2022 1519   GLUCOSE 85 12/15/2014 2220   BUN 12 08/06/2022 1519   BUN 12 12/15/2014 2220   CREATININE 0.90 08/06/2022 1519   CREATININE 0.94 12/15/2014 2220   CALCIUM 9.6 08/06/2022 1519   CALCIUM 9.0 12/15/2014 2220   GFRNONAA >60 08/06/2022 1519   GFRNONAA >60 12/15/2014 2220   GFRAA >60 01/26/2019 2159   GFRAA >60 12/15/2014 2220   CrCl cannot be calculated (Patient's most recent lab result is older than the maximum 21 days allowed.).  COAG No results found for: "INR", "PROTIME"  Radiology No results found.   Assessment/Plan There are no diagnoses linked to this encounter.   Hortencia Pilar, MD  10/05/2022 10:11 AM

## 2022-10-06 ENCOUNTER — Ambulatory Visit (INDEPENDENT_AMBULATORY_CARE_PROVIDER_SITE_OTHER): Payer: BC Managed Care – PPO | Admitting: Vascular Surgery

## 2022-10-06 ENCOUNTER — Encounter (INDEPENDENT_AMBULATORY_CARE_PROVIDER_SITE_OTHER): Payer: Self-pay | Admitting: Vascular Surgery

## 2022-10-06 VITALS — BP 111/73 | HR 73 | Resp 16 | Wt 350.8 lb

## 2022-10-06 DIAGNOSIS — I872 Venous insufficiency (chronic) (peripheral): Secondary | ICD-10-CM

## 2022-10-06 DIAGNOSIS — K219 Gastro-esophageal reflux disease without esophagitis: Secondary | ICD-10-CM

## 2022-10-06 DIAGNOSIS — I890 Lymphedema, not elsewhere classified: Secondary | ICD-10-CM | POA: Diagnosis not present

## 2022-10-06 DIAGNOSIS — I10 Essential (primary) hypertension: Secondary | ICD-10-CM | POA: Diagnosis not present

## 2022-10-20 ENCOUNTER — Encounter (INDEPENDENT_AMBULATORY_CARE_PROVIDER_SITE_OTHER): Payer: Self-pay

## 2022-10-31 DIAGNOSIS — G4733 Obstructive sleep apnea (adult) (pediatric): Secondary | ICD-10-CM | POA: Diagnosis not present

## 2022-11-21 DIAGNOSIS — Z3042 Encounter for surveillance of injectable contraceptive: Secondary | ICD-10-CM | POA: Diagnosis not present

## 2022-12-01 DIAGNOSIS — G4733 Obstructive sleep apnea (adult) (pediatric): Secondary | ICD-10-CM | POA: Diagnosis not present

## 2022-12-03 DIAGNOSIS — G4733 Obstructive sleep apnea (adult) (pediatric): Secondary | ICD-10-CM | POA: Diagnosis not present

## 2022-12-23 DIAGNOSIS — G4733 Obstructive sleep apnea (adult) (pediatric): Secondary | ICD-10-CM | POA: Diagnosis not present

## 2022-12-23 DIAGNOSIS — G43809 Other migraine, not intractable, without status migrainosus: Secondary | ICD-10-CM | POA: Diagnosis not present

## 2022-12-23 DIAGNOSIS — R202 Paresthesia of skin: Secondary | ICD-10-CM | POA: Diagnosis not present

## 2022-12-24 ENCOUNTER — Ambulatory Visit (INDEPENDENT_AMBULATORY_CARE_PROVIDER_SITE_OTHER): Payer: BC Managed Care – PPO | Admitting: Psychiatry

## 2022-12-24 ENCOUNTER — Encounter: Payer: Self-pay | Admitting: Psychiatry

## 2022-12-24 VITALS — BP 133/78 | HR 87 | Temp 98.9°F | Ht 66.0 in | Wt 350.6 lb

## 2022-12-24 DIAGNOSIS — F411 Generalized anxiety disorder: Secondary | ICD-10-CM | POA: Diagnosis not present

## 2022-12-24 DIAGNOSIS — F3342 Major depressive disorder, recurrent, in full remission: Secondary | ICD-10-CM | POA: Diagnosis not present

## 2022-12-24 DIAGNOSIS — G4701 Insomnia due to medical condition: Secondary | ICD-10-CM | POA: Diagnosis not present

## 2022-12-24 NOTE — Progress Notes (Signed)
[facility] MD OP Progress Note  12/24/2022 9:16 AM Evelyn Flores  MRN:  [id number]  Chief Complaint:  Chief Complaint  Patient presents with   Follow-up   Medication Refill   Anxiety   Depression   HPI: Evelyn Flores is a 37 year old Caucasian female, single, employed, lives at [facility], has a history of MDD, GAD, insomnia was evaluated in office today.  Patient today reports she is overall doing well with regards to her mood.  Denies any significant depression.  There are certain situational stressors including recent death of a family member.  She however reports she has been coping okay.  Reports work as stressful however she has been coping well.  Continues to have lower extremity pain, polyneuropathy pain, currently on pregabalin.  Continues to follow-up with neurology.  She does have gait imbalance on and off, evaluated by neurology yesterday.  Could not find a specific reason.  Currently monitoring it.  Patient reports she is currently compliant on the sertraline.  Denies side effects.  Reports sleep is good.  Continues to be compliant on CPAP.  Patient denies any suicidality, homicidality or perceptual disturbances.  Denies any other concerns today.  Visit Diagnosis:    ICD-10-CM   1. MDD (major depressive disorder), recurrent, in full remission ([place])  F33.42     2. GAD (generalized anxiety disorder)  F41.1     3. 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.01    mood, sleep apnea      Past Psychiatric History: Reviewed past psychiatric history from progress note on 03/18/2018.  Past Medical History:  Past Medical History:  Diagnosis Date   Anxiety    Depression    DVT (deep venous thrombosis) ([facility])    Ear infection     Past Surgical History:  Procedure Laterality Date   CHOLECYSTECTOMY     VEIN SURGERY Bilateral    vein surgery both legs    Family Psychiatric History: Reviewed family psychiatric history from progress note on 03/18/2018.  Family History:   Family History  Problem Relation Age of Onset   Panic disorder Mother    Dementia Paternal Grandfather     Social History: Reviewed social history from progress note on 03/18/2018. Social History   Socioeconomic History   Marital status: Single    Spouse name: Not on file   Number of children: 0   Years of education: Not on file   Highest education level: High school graduate  Occupational History   Occupation: sport [employer]    Comment: fulltime  Tobacco Use   Smoking status: Never   Smokeless tobacco: Never  Vaping Use   Vaping Use: Never used  Substance and Sexual Activity   Alcohol use: No    Alcohol/week: 0.0 standard drinks of alcohol   Drug use: No   Sexual activity: Yes    Birth control/protection: Injection, Condom  Other Topics Concern   Not on file  Social History Narrative   Not on file   Social Determinants of Health   Financial Resource Strain: Low Risk  (11/10/2017)   Overall Financial Resource Strain (CARDIA)    Difficulty of Paying Living Expenses: Not hard at all  Food Insecurity: No Food Insecurity (11/10/2017)   Hunger Vital Sign    Worried About Running Out of Food in the Last Year: Never true    [place] in the Last Year: Never true  Transportation Needs: No Transportation Needs (11/10/2017)   [place] - Transportation  Lack of Transportation (Medical): No    Lack of Transportation (Non-Medical): No  Physical Activity: Insufficiently Active (11/10/2017)   Exercise Vital Sign    Days of Exercise per Week: 3 days    Minutes of Exercise per Session: 30 min  Stress: Stress Concern Present (11/10/2017)   [place]    Feeling of Stress : Very much  Social Connections: Socially Isolated (11/10/2017)   Social Connection and Isolation Panel [NHANES]    Frequency of Communication with Friends and Family: Once a week    Frequency of Social Gatherings with Friends and  Family: Once a week    Attends Religious Services: Never    Marine scientist or Organizations: No    Attends Archivist Meetings: Never    Marital Status: Never married    Allergies:  Allergies  Allergen Reactions   Misc. Sulfonamide Containing Compounds Other (See Comments) and Hives   Sulfa Antibiotics Swelling, Other (See Comments) and Hives   Buspirone Other (See Comments)    spasms spasms spasms   Influenza Vaccines Other (See Comments)    Very sick and was hospitalized    Topiramate Other (See Comments)    Mental fogginess, tingling, numbness    Metabolic Disorder Labs: No results found for: "HGBA1C", "MPG" No results found for: "PROLACTIN" No results found for: "CHOL", "TRIG", "HDL", "CHOLHDL", "VLDL", "LDLCALC" No results found for: "TSH"  Therapeutic Level Labs: No results found for: "LITHIUM" No results found for: "VALPROATE" No results found for: "CBMZ"  Current Medications: Current Outpatient Medications  Medication Sig Dispense Refill   ibuprofen (ADVIL) 600 MG tablet Take 1 tablet (600 mg total) by mouth every 6 (six) hours as needed. 30 tablet 0   magnesium oxide (MAG-OX) 400 MG tablet Take by mouth.     medroxyPROGESTERone (DEPO-PROVERA) 150 MG/ML injection Inject 150 mg into the muscle every 3 (three) months. Last injection June 2015     omeprazole (PRILOSEC) 40 MG capsule Take 40 mg by mouth daily.     rizatriptan (MAXALT-MLT) 10 MG disintegrating tablet Take by mouth.     sertraline (ZOLOFT) 100 MG tablet Take 2 tablets (200 mg total) by mouth daily. 180 tablet 1   triamterene-hydrochlorothiazide (MAXZIDE-25) 37.5-25 MG tablet Take 0.5 tablets by mouth daily.  6   pregabalin (LYRICA) 100 MG capsule Take 1 capsule by mouth 2 (two) times daily.     No current facility-administered medications for this visit.     Musculoskeletal: Strength & Muscle Tone: within normal limits Gait & Station:  slow Patient leans: N/A  Psychiatric  Specialty Exam: Review of Systems  Neurological:  Positive for tremors (chronic).  Psychiatric/Behavioral: Negative.    All other systems reviewed and are negative.   Blood pressure 133/78, pulse 87, temperature 98.9 F (37.2 C), temperature source Oral, height '5\' 6"'$  (1.676 m), weight (!) 350 lb 9.6 oz (159 kg), SpO2 96 %.Body mass index is 56.59 kg/m.  General Appearance: Casual  Eye Contact:  Fair  Speech:  Clear and Coherent  Volume:  Normal  Mood:  Euthymic  Affect:  Appropriate  Thought Process:  Goal Directed and Descriptions of Associations: Intact  Orientation:  Full (Time, Place, and Person)  Thought Content: Logical   Suicidal Thoughts:  No  Homicidal Thoughts:  No  Memory:  Immediate;   Fair Recent;   Fair Remote;   Fair  Judgement:  Fair  Insight:  Fair  Psychomotor Activity:  Tremor  Concentration:  Concentration: Fair and Attention Span: Fair  Recall:  [employer] of Knowledge: Fair  Language: Fair  Akathisia:  No  Handed:  Right  AIMS (if indicated): not done  Assets:  Agricultural consultant Housing Social Support Talents/Skills  ADL's:  Intact  Cognition: WNL  Sleep:  Fair   Screenings: [facility] Office Visit from 04/07/2022 in [facility] Total Score 0      [place] Office Visit from 12/24/2022 in [facility] Office Visit from 08/22/2022 in [facility] Visit from 12/26/2021 in [facility] Video Visit from 10/14/2021 in [facility] Video Visit from 07/19/2021 in [facility]  Total GAD-7 Score 1 2 0 1 0      PHQ2-9    [place] Visit from 12/24/2022 in [facility] Office Visit from 08/22/2022 in [facility] Visit from 04/07/2022 in [facility] Visit from 12/26/2021 in [place] Video Visit from 10/14/2021 in [facility]  PHQ-2 Total Score 1 1 0 0 0  PHQ-9 Total Score -- 4 1 -- --      [place] Visit from 12/24/2022 in [facility] Office Visit from 08/22/2022 in [facility] ED from 08/06/2022 in [facility] No Risk No Risk No Risk        Assessment and Plan: Evelyn Flores is a 37 year old Caucasian female, single, employed, lives in [place], presented for medication management.  Patient is currently stable.  Plan as noted below.  Plan MDD in remission Zoloft 200 mg p.o. daily  GAD-stable Zoloft 200 mg p.o. daily  Insomnia-stable Continue CPAP for OSA Will need sufficient pain management as well as continued management of anxiety. Continue sleep hygiene techniques which were discussed.  Reviewed notes per neurology-Ms.Paich -dated 12/23/2022-patient with vestibular migraines, ENT could not rule out Mnire's disease.  Will not be able to prescribe beta-blockers as patient has low heart rate and did not tolerate topiramate.  Currently on Excedrin Migraine as needed Magnesium oxide.  Patient with polyneuropathy-currently on pregablin Benign essential tremor-right more than left-mild intermittent-not interfering to the point of warranting treatment Mild obstructive sleep apnea-on CPAP Imbalance-no signs and symptoms suggestive of cervical myelopathy,gait unremarkable.  MRI brain offered and deferred. Vestibular exercises, balance exercises offered.'  Follow-up in clinic in 4 months or sooner if needed.   This note was generated in part or whole with voice recognition software. Voice recognition is usually quite accurate but there are transcription errors that can and very often do occur. I apologize for any  typographical errors that were not detected and corrected.     Ursula Alert, MD 12/24/2022, 9:16 AM

## 2023-01-01 DIAGNOSIS — G4733 Obstructive sleep apnea (adult) (pediatric): Secondary | ICD-10-CM | POA: Diagnosis not present

## 2023-01-16 DIAGNOSIS — B029 Zoster without complications: Secondary | ICD-10-CM | POA: Diagnosis not present

## 2023-02-01 DIAGNOSIS — G4733 Obstructive sleep apnea (adult) (pediatric): Secondary | ICD-10-CM | POA: Diagnosis not present

## 2023-02-13 DIAGNOSIS — Z3042 Encounter for surveillance of injectable contraceptive: Secondary | ICD-10-CM | POA: Diagnosis not present

## 2023-02-15 ENCOUNTER — Other Ambulatory Visit: Payer: Self-pay | Admitting: Psychiatry

## 2023-02-15 DIAGNOSIS — F411 Generalized anxiety disorder: Secondary | ICD-10-CM

## 2023-02-23 ENCOUNTER — Ambulatory Visit
Admission: EM | Admit: 2023-02-23 | Discharge: 2023-02-23 | Disposition: A | Discharge status: Home / Self Care | Payer: BC Managed Care – PPO | Attending: Emergency Medicine | Admitting: Emergency Medicine

## 2023-02-23 ENCOUNTER — Encounter: Payer: Self-pay | Admitting: Emergency Medicine

## 2023-02-23 DIAGNOSIS — U071 COVID-19: Secondary | ICD-10-CM | POA: Diagnosis not present

## 2023-02-23 LAB — GROUP A STREP BY PCR: Group A Strep by PCR: NOT DETECTED

## 2023-02-23 LAB — RAPID INFLUENZA A&B ANTIGENS
Influenza A (ARMC): NEGATIVE
Influenza B (ARMC): NEGATIVE

## 2023-02-23 LAB — SARS CORONAVIRUS 2 BY RT PCR: SARS Coronavirus 2 by RT PCR: POSITIVE — AB

## 2023-02-23 MED ORDER — IPRATROPIUM BROMIDE 0.06 % NA SOLN: 2.0000 | Freq: Four times a day (QID) | NASAL | 12 refills | Status: AC

## 2023-02-23 MED ORDER — BENZONATATE 100 MG PO CAPS: 200.0000 mg | ORAL_CAPSULE | Freq: Three times a day (TID) | ORAL | 0 refills | Status: DC

## 2023-02-23 MED ORDER — PROMETHAZINE-DM 6.25-15 MG/5ML PO SYRP: 5.0000 mL | ORAL_SOLUTION | Freq: Four times a day (QID) | ORAL | 0 refills | Status: DC | PRN

## 2023-02-23 MED ORDER — NIRMATRELVIR/RITONAVIR (PAXLOVID)TABLET: 3.0000 | ORAL_TABLET | Freq: Two times a day (BID) | ORAL | 0 refills | Status: AC

## 2023-02-23 NOTE — Discharge Instructions (Signed)
You have tested positive for COVID-19 today.  You will need to quarantine for 5 days from onset of your symptoms.  After 5 days you can break quarantine if your symptoms have improved and you have not run a fever for 24 hours without taking Tylenol and/or ibuprofen.  You must wear a mask around other people for an additional 5 days.  Use over-the-counter Tylenol and/or ibuprofen according to the package instructions as needed for body aches and fever.  Take the Paxlovid twice daily for 5 days for treatment of COVID-19.  Use the Atrovent nasal spray, 2 squirts in each nostril every 6 hours, as needed for runny nose and postnasal drip.  Use the Tessalon Perles every 8 hours during the day.  Take them with a small sip of water.  They may give you some numbness to the base of your tongue or a metallic taste in your mouth, this is normal.  Use the Promethazine DM cough syrup at bedtime for cough and congestion.  It will make you drowsy so do not take it during the day.  If you develop any shortness of breath, especially at rest, feel as though you cannot catch your breath, you cannot speak in full sentences, or your lips begin turning blue you need to call 911 and go to the ER.

## 2023-02-23 NOTE — ED Triage Notes (Signed)
Pt presents with a cough, fever, sore throat and head congestion x 2 days.

## 2023-02-23 NOTE — ED Provider Notes (Signed)
[facility]    CSN: [id number] Arrival date & time: 02/23/23  1224      History   Chief Complaint Chief Complaint  Patient presents with   Fever   Sore Throat   Cough    HPI Evelyn Flores is a 37 y.o. female.   HPI  37 year old female here for evaluation of respiratory complaints.  Patient has a past medical history significant for anxiety, depression, and DVT presenting for evaluation of 2 days worth of fever with a Tmax of 102, chills, nasal congestion with clear discharge, and a nonproductive cough.  She also endorses a sore throat.  She denies shortness of breath or wheezing and she denies any known sick contacts.  Past Medical History:  Diagnosis Date   Anxiety    Depression    DVT (deep venous thrombosis) ([place])    Ear infection     Patient Active Problem List   Diagnosis Date Noted   Dysphagia 05/02/2022   Epigastric pain 05/02/2022   Lymphedema 12/03/2020   Plantar fasciitis of left foot 02/16/2020   Lymphedema of lower extremity 12/07/2019   Essential hypertension 11/24/2019   Uterine cramping 06/28/2019   MDD (major depressive disorder), recurrent, in full remission ([place]) 06/28/2019   Insomnia 06/28/2019   GAD (generalized anxiety disorder) 06/28/2019   Bereavement 06/28/2019   MDD (major depressive disorder), recurrent episode, mild ([place]) 03/18/2018   Varicose veins with pain 02/18/2017   Venous insufficiency 12/05/2015   Chronic deep vein thrombosis (DVT) of femoral vein of right lower extremity ([place]) 11/01/2015   Gastroesophageal reflux disease without esophagitis 11/01/2015   Leg swelling 11/01/2015   PCOS (polycystic ovarian syndrome) 11/01/2015   Obesity, Class III, BMI 40-49.9 (morbid obesity) ([place]) 11/01/2015   Allergic rhinitis due to pollen 11/01/2015   Sensorineural hearing loss of both ears 11/01/2015   Tinnitus 11/01/2015   Abnormal EKG 03/08/2015   Chest pressure 03/08/2015   Swelling of left lower extremity 10/11/2014    Neoplasm of uncertain behavior of skin 09/19/2014   Abdominal pain 03/14/2014   Diarrhea, unspecified 03/14/2014   GERD (gastroesophageal reflux disease) 03/14/2014   Morbid obesity ([place]) 03/14/2014   Tibial tendonitis, posterior 08/24/2013   History of blood clots 07/01/2013   Paresthesias 12/29/2012   Meniere's disease 10/18/2012   Migraine 10/18/2012    Past Surgical History:  Procedure Laterality Date   CHOLECYSTECTOMY     VEIN SURGERY Bilateral    vein surgery both legs    OB History   No obstetric history on file.      Home Medications    Prior to Admission medications   Medication Sig Start Date End Date Taking? Authorizing Provider  benzonatate (TESSALON) 100 MG capsule Take 2 capsules (200 mg total) by mouth every 8 (eight) hours. 02/23/23  Yes Margarette Canada, NP  ipratropium (ATROVENT) 0.06 % nasal spray Place 2 sprays into both nostrils 4 (four) times daily. 02/23/23  Yes Margarette Canada, NP  nirmatrelvir/ritonavir (PAXLOVID) 20 x 150 MG & 10 x '100MG'$  TABS Take 3 tablets by mouth 2 (two) times daily for 5 days. 02/23/23 02/28/23 Yes Margarette Canada, NP  promethazine-dextromethorphan (PROMETHAZINE-DM) 6.25-15 MG/5ML syrup Take 5 mLs by mouth 4 (four) times daily as needed. 02/23/23  Yes Margarette Canada, NP  ibuprofen (ADVIL) 600 MG tablet Take 1 tablet (600 mg total) by mouth every 6 (six) hours as needed. 08/06/22   Melynda Ripple, MD  magnesium oxide (MAG-OX) 400 MG tablet Take by mouth.  [provider]  medroxyPROGESTERone (DEPO-PROVERA) 150 MG/ML injection Inject 150 mg into the muscle every 3 (three) months. Last injection June 2015    [provider]  omeprazole (PRILOSEC) 40 MG capsule Take 40 mg by mouth daily.    [provider]  pregabalin (LYRICA) 100 MG capsule Take 1 capsule by mouth 2 (two) times daily. 10/11/21 10/11/22  [provider]  rizatriptan (MAXALT-MLT) 10 MG disintegrating tablet Take by mouth. 02/26/22 02/26/23  [provider]  sertraline (ZOLOFT) 100 MG tablet Take 2 tablets (200 mg total) by mouth daily. 02/15/23 05/16/23  Norman Clay, MD  triamterene-hydrochlorothiazide (MAXZIDE-25) 37.5-25 MG tablet Take 0.5 tablets by mouth daily. 04/24/18   [provider]    Family History Family History  Problem Relation Age of Onset   Panic disorder Mother    Dementia Paternal Grandfather     Social History Social History   Tobacco Use   Smoking status: Never   Smokeless tobacco: Never  Vaping Use   Vaping Use: Never used  Substance Use Topics   Alcohol use: No    Alcohol/week: 0.0 standard drinks of alcohol   Drug use: No     Allergies   Misc. sulfonamide containing compounds, Sulfa antibiotics, Buspirone, Influenza vaccines, and Topiramate   Review of Systems Review of Systems  Constitutional:  Positive for chills and fever.  HENT:  Positive for congestion, rhinorrhea and sore throat.   Respiratory:  Positive for cough. Negative for shortness of breath and wheezing.      Physical Exam Triage Vital Signs ED Triage Vitals  Enc Vitals Group     BP 02/23/23 1248 113/77     Pulse Rate 02/23/23 1248 91     Resp 02/23/23 1248 16     Temp 02/23/23 1248 99.6 F (37.6 C)     Temp Source 02/23/23 1248 Oral     SpO2 02/23/23 1248 99 %     Weight --      Height --      Head Circumference --      Peak Flow --      Pain Score 02/23/23 1246 0     Pain Loc --      Pain Edu? --      Excl. in [place]? --    No data found.  Updated Vital Signs BP 113/77 (BP Location: Left Arm)   Pulse 91   Temp 99.6 F (37.6 C) (Oral)   Resp 16   SpO2 99%   Visual Acuity Right Eye Distance:   Left Eye Distance:   Bilateral Distance:    Right Eye Near:   Left Eye Near:    Bilateral Near:     Physical Exam Vitals and nursing note reviewed.  Constitutional:      Appearance: Normal appearance. She is not ill-appearing.  HENT:     Head: Normocephalic and atraumatic.     Right Ear:  Tympanic membrane, ear canal and external ear normal. There is no impacted cerumen.     Left Ear: Tympanic membrane, ear canal and external ear normal. There is no impacted cerumen.     Nose: Congestion and rhinorrhea present.     Comments: Nasal mucosa is erythematous and edematous with clear discharge in both nares.    Mouth/Throat:     Mouth: Mucous membranes are moist.     Pharynx: Oropharynx is clear. Posterior oropharyngeal erythema present. No oropharyngeal exudate.     Comments: Tonsillar pillars are 1+ edematous  and erythematous but there is no appreciable exudate.  Posterior oropharynx has erythema and mild injection with clear postnasal drip. Cardiovascular:     Rate and Rhythm: Normal rate and regular rhythm.     Pulses: Normal pulses.     Heart sounds: Normal heart sounds. No murmur heard.    No friction rub. No gallop.  Pulmonary:     Effort: Pulmonary effort is normal.     Breath sounds: Normal breath sounds. No wheezing, rhonchi or rales.  Musculoskeletal:     Cervical back: Normal range of motion and neck supple.  Lymphadenopathy:     Cervical: No cervical adenopathy.  Skin:    General: Skin is warm and dry.     Capillary Refill: Capillary refill takes less than 2 seconds.     Findings: No rash.  Neurological:     General: No focal deficit present.     Mental Status: She is alert and oriented to person, place, and time.      UC Treatments / Results  Labs (all labs ordered are listed, but only abnormal results are displayed) Labs Reviewed  SARS CORONAVIRUS 2 BY RT PCR - Abnormal; Notable for the following components:      Result Value   SARS Coronavirus 2 by RT PCR POSITIVE (*)    All other components within normal limits  RAPID INFLUENZA A&B ANTIGENS  GROUP A STREP BY PCR    EKG   Radiology No results found.  Procedures Procedures (including critical care time)  Medications Ordered in UC Medications - No data to display  Initial Impression /  Assessment and Plan / UC Course  I have reviewed the triage vital signs and the nursing notes.  Pertinent labs & imaging results that were available during my care of the patient were reviewed by me and considered in my medical decision making (see chart for details).   Patient is a pleasant and nontoxic-appearing 37 year old female presenting for evaluation of 2 days worth of COVID/flulike symptoms as outlined in HPI above.  The patient is not in any acute distress and she is able to speak in full sentence without dyspnea or tachypnea.  Her room air oxygen saturation is 99% and her respiratory rate is 16.  She has a mildly elevated temp of 99.6.  Given her cluster of symptoms I will order a flu antigen test, COVID PCR, and strep PCR.  Strep PCR is negative.  Influenza antigen chest is negative for A and B.  COVID PCR is positive.  I will discharge patient home with a diagnosis of COVID-19 and have her quarantine for 5 days from onset of her symptoms.  I will give her a work note to cover her for the quarantine duration.  I will prescribe her Atrovent nasal spray, Tessalon Perles, Promethazine DM cough syrup to help with her symptoms.  CMP from 08/06/2022 shows a GFR of greater than 60 so I will discharge her home on Paxlovid given her DVT history as well as obesity she qualifies.   Final Clinical Impressions(s) / UC Diagnoses   Final diagnoses:  [id number]     Discharge Instructions      You have tested positive for COVID-19 today.  You will need to quarantine for 5 days from onset of your symptoms.  After 5 days you can break quarantine if your symptoms have improved and you have not run a fever for 24 hours without taking Tylenol and/or ibuprofen.  You must wear a mask around other  people for an additional 5 days.  Use over-the-counter Tylenol and/or ibuprofen according to the package instructions as needed for body aches and fever.  Take the Paxlovid twice daily for 5 days for  treatment of COVID-19.  Use the Atrovent nasal spray, 2 squirts in each nostril every 6 hours, as needed for runny nose and postnasal drip.  Use the Tessalon Perles every 8 hours during the day.  Take them with a small sip of water.  They may give you some numbness to the base of your tongue or a metallic taste in your mouth, this is normal.  Use the Promethazine DM cough syrup at bedtime for cough and congestion.  It will make you drowsy so do not take it during the day.  If you develop any shortness of breath, especially at rest, feel as though you cannot catch your breath, you cannot speak in full sentences, or your lips begin turning blue you need to call 911 and go to the ER.     ED Prescriptions     Medication Sig Dispense Auth. Provider   benzonatate (TESSALON) 100 MG capsule Take 2 capsules (200 mg total) by mouth every 8 (eight) hours. 21 capsule Margarette Canada, NP   ipratropium (ATROVENT) 0.06 % nasal spray Place 2 sprays into both nostrils 4 (four) times daily. 15 mL Margarette Canada, NP   promethazine-dextromethorphan (PROMETHAZINE-DM) 6.25-15 MG/5ML syrup Take 5 mLs by mouth 4 (four) times daily as needed. 118 mL Margarette Canada, NP   nirmatrelvir/ritonavir (PAXLOVID) 20 x 150 MG & 10 x '100MG'$  TABS Take 3 tablets by mouth 2 (two) times daily for 5 days. 30 tablet Margarette Canada, NP      PDMP not reviewed this encounter.   Margarette Canada, NP 02/23/23 1354

## 2023-03-04 DIAGNOSIS — G4733 Obstructive sleep apnea (adult) (pediatric): Secondary | ICD-10-CM | POA: Diagnosis not present

## 2023-03-24 DIAGNOSIS — G43809 Other migraine, not intractable, without status migrainosus: Secondary | ICD-10-CM | POA: Diagnosis not present

## 2023-03-24 DIAGNOSIS — R2689 Other abnormalities of gait and mobility: Secondary | ICD-10-CM | POA: Diagnosis not present

## 2023-03-24 DIAGNOSIS — G250 Essential tremor: Secondary | ICD-10-CM | POA: Diagnosis not present

## 2023-03-24 DIAGNOSIS — R202 Paresthesia of skin: Secondary | ICD-10-CM | POA: Diagnosis not present

## 2023-03-31 ENCOUNTER — Other Ambulatory Visit: Payer: Self-pay | Admitting: Student

## 2023-03-31 DIAGNOSIS — G43809 Other migraine, not intractable, without status migrainosus: Secondary | ICD-10-CM

## 2023-04-04 DIAGNOSIS — G4733 Obstructive sleep apnea (adult) (pediatric): Secondary | ICD-10-CM | POA: Diagnosis not present

## 2023-04-15 ENCOUNTER — Encounter: Payer: Self-pay | Admitting: Neurology

## 2023-04-20 ENCOUNTER — Encounter: Payer: Self-pay | Admitting: Student

## 2023-04-21 ENCOUNTER — Other Ambulatory Visit: Payer: BC Managed Care – PPO

## 2023-04-24 ENCOUNTER — Ambulatory Visit: Payer: BC Managed Care – PPO | Admitting: Psychiatry

## 2023-05-04 DIAGNOSIS — G4733 Obstructive sleep apnea (adult) (pediatric): Secondary | ICD-10-CM | POA: Diagnosis not present

## 2023-05-05 ENCOUNTER — Other Ambulatory Visit (INDEPENDENT_AMBULATORY_CARE_PROVIDER_SITE_OTHER): Payer: Self-pay | Admitting: Nurse Practitioner

## 2023-05-05 DIAGNOSIS — M79606 Pain in leg, unspecified: Secondary | ICD-10-CM

## 2023-05-06 ENCOUNTER — Ambulatory Visit (INDEPENDENT_AMBULATORY_CARE_PROVIDER_SITE_OTHER): Payer: BC Managed Care – PPO

## 2023-05-06 DIAGNOSIS — M7989 Other specified soft tissue disorders: Secondary | ICD-10-CM | POA: Diagnosis not present

## 2023-05-06 DIAGNOSIS — M79604 Pain in right leg: Secondary | ICD-10-CM | POA: Diagnosis not present

## 2023-05-06 DIAGNOSIS — M79606 Pain in leg, unspecified: Secondary | ICD-10-CM

## 2023-05-08 DIAGNOSIS — Z3042 Encounter for surveillance of injectable contraceptive: Secondary | ICD-10-CM | POA: Diagnosis not present

## 2023-05-12 ENCOUNTER — Ambulatory Visit (INDEPENDENT_AMBULATORY_CARE_PROVIDER_SITE_OTHER): Payer: BC Managed Care – PPO | Admitting: Nurse Practitioner

## 2023-05-12 ENCOUNTER — Encounter (INDEPENDENT_AMBULATORY_CARE_PROVIDER_SITE_OTHER): Payer: Self-pay | Admitting: Nurse Practitioner

## 2023-05-12 VITALS — BP 125/74 | HR 87 | Resp 16 | Wt 352.2 lb

## 2023-05-12 DIAGNOSIS — I890 Lymphedema, not elsewhere classified: Secondary | ICD-10-CM

## 2023-05-12 DIAGNOSIS — I809 Phlebitis and thrombophlebitis of unspecified site: Secondary | ICD-10-CM

## 2023-05-13 ENCOUNTER — Encounter (INDEPENDENT_AMBULATORY_CARE_PROVIDER_SITE_OTHER): Payer: Self-pay | Admitting: Nurse Practitioner

## 2023-05-13 NOTE — Progress Notes (Signed)
Subjective:    Patient ID: Evelyn Flores, female    DOB: 1986/02/14, 37 y.o.   MRN: [id number] Chief Complaint  Patient presents with   Follow-up    Right leg pain    Evelyn Flores is a 37 year old female who presents today for evaluation of a superficial bite to the right lower extremity.  She contacted our office due to having tenderness and pain in the calf area with wearing the patient's wound across the calf area.  She has a previous history of DVT in the right lower extremity although were not certain exactly when this was.  She also has a history of lymphedema and currently wears medical grade compression and using lymphedema pump daily daily.  She denies any specific trauma to the area.  She had an ultrasound several days ago which revealed the thrombophlebitis in the great saphenous vein as well as small saphenous vein.  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  At that time she was instructed to begin utilizing a daily baby aspirin in addition to warm compresses of her leg.  She notes that today the tenderness is improved but still present.    Review of Systems  Cardiovascular:  Positive for leg swelling.  All other systems reviewed and are negative.      Objective:   Physical Exam Vitals reviewed.  HENT:     Head: Normocephalic.  Cardiovascular:     Rate and Rhythm: Normal rate.  Pulmonary:     Effort: Pulmonary effort is normal.  Musculoskeletal:        General: Tenderness present.     Right lower leg: Edema present.     Left lower leg: Edema present.  Skin:    General: Skin is warm and dry.  Neurological:     Mental Status: She is alert and oriented to person, place, and time.  Psychiatric:        Mood and Affect: Mood normal.        Behavior: Behavior normal.        Thought Content: Thought content normal.        Judgment: Judgment normal.     BP 125/74 (BP Location: Left Arm)   Pulse 87   Resp 16   Wt (!) 352 lb 3.2 oz  (159.8 kg)   BMI 56.85 kg/m   Past Medical History:  Diagnosis Date   Anxiety    Depression    DVT (deep venous thrombosis) ([facility])    Ear infection     Social History   Socioeconomic History   Marital status: Single    Spouse name: Not on file   Number of children: 0   Years of education: Not on file   Highest education level: High school graduate  Occupational History   Occupation: [employer]    Comment: fulltime  Tobacco Use   Smoking status: Never   Smokeless tobacco: Never  Vaping Use   Vaping Use: Never used  Substance and Sexual Activity   Alcohol use: No    Alcohol/week: 0.0 standard drinks of alcohol   Drug use: No   Sexual activity: Yes    Birth control/protection: Injection, Condom  Other Topics Concern   Not on file  Social History Narrative   Not on file   Social Determinants of Health   Financial Resource Strain: Low Risk  (11/10/2017)   Overall Financial Resource Strain (CARDIA)    Difficulty of Paying Living Expenses: Not hard  at all  Food Insecurity: No Food Insecurity (11/10/2017)   Hunger Vital Sign    Worried About Running Out of Food in the Last Year: Never true    Ran Out of Food in the Last Year: Never true  Transportation Needs: No Transportation Needs (11/10/2017)   PRAPARE - Administrator, Civil Service (Medical): No    Lack of Transportation (Non-Medical): No  Physical Activity: Insufficiently Active (11/10/2017)   Exercise Vital Sign    Days of Exercise per Week: 3 days    Minutes of Exercise per Session: 30 min  Stress: Stress Concern Present (11/10/2017)   [employer] of Occupational Health - Occupational Stress Questionnaire    Feeling of Stress : Very much  Social Connections: Socially Isolated (11/10/2017)   Social Connection and Isolation Panel [NHANES]    Frequency of Communication with Friends and Family: Once a week    Frequency of Social Gatherings with Friends and Family: Once a week    Attends  Religious Services: Never    Database administrator or Organizations: No    Attends Banker Meetings: Never    Marital Status: Never married  Intimate Partner Violence: Not At Risk (11/10/2017)   Humiliation, Afraid, Rape, and Kick questionnaire    Fear of Current or Ex-Partner: No    Emotionally Abused: No    Physically Abused: No    Sexually Abused: No    Past Surgical History:  Procedure Laterality Date   CHOLECYSTECTOMY     VEIN SURGERY Bilateral    vein surgery both legs    Family History  Problem Relation Age of Onset   Panic disorder Mother    Dementia Paternal Grandfather     Allergies  Allergen Reactions   Misc. Sulfonamide Containing Compounds Other (See Comments) and Hives   Sulfa Antibiotics Swelling, Other (See Comments) and Hives   Buspirone Other (See Comments)    spasms spasms spasms   Influenza Vaccines Other (See Comments)    Very sick and was hospitalized    Topiramate Other (See Comments)    Mental fogginess, tingling, numbness       Latest Ref Rng & Units 08/06/2022    3:19 PM 10/17/2021    8:13 PM 01/26/2019    9:59 PM  CBC  WBC 4.0 - 10.5 K/uL 7.7  9.0  9.1   Hemoglobin 12.0 - 15.0 g/dL 82.9  56.2  13.0   Hematocrit 36.0 - 46.0 % 40.9  39.5  39.0   Platelets 150 - 400 K/uL 278  229  228       CMP     Component Value Date/Time   NA 138 08/06/2022 1519   NA 140 12/15/2014 2220   K 3.1 (L) 08/06/2022 1519   K 3.6 12/15/2014 2220   CL 105 08/06/2022 1519   CL 106 12/15/2014 2220   CO2 26 08/06/2022 1519   CO2 29 12/15/2014 2220   GLUCOSE 95 08/06/2022 1519   GLUCOSE 85 12/15/2014 2220   BUN 12 08/06/2022 1519   BUN 12 12/15/2014 2220   CREATININE 0.90 08/06/2022 1519   CREATININE 0.94 12/15/2014 2220   CALCIUM 9.6 08/06/2022 1519   CALCIUM 9.0 12/15/2014 2220   PROT 8.3 (H) 08/06/2022 1519   PROT 7.3 12/15/2014 2220   ALBUMIN 4.1 08/06/2022 1519   ALBUMIN 3.6 12/15/2014 2220   AST 22 08/06/2022 1519   AST 12 (L)  12/15/2014 2220   ALT 27 08/06/2022 1519  ALT 24 12/15/2014 2220   ALKPHOS 57 08/06/2022 1519   ALKPHOS 70 12/15/2014 2220   BILITOT 0.3 08/06/2022 1519   BILITOT 0.2 12/15/2014 2220   GFRNONAA >60 08/06/2022 1519   GFRNONAA >60 12/15/2014 2220   GFRAA >60 01/26/2019 2159   GFRAA >60 12/15/2014 2220     No results found.     Assessment & Plan:   1. Thrombophlebitis The patient has a known thrombophlebitis in the distal calf and small saphenous vein in the area that is painful for her.  She has been doing warm compresses and aspirin since her ultrasound last week events noted some improvement.  Patient is advised to continue with aspirin and warm compresses in addition to compression.  Will plan on having her follow-up in 4 to 5 weeks in order to evaluate progress and improvement.  Patient is advised that if the symptoms suddenly worsen to contact our office for sooner evaluation.  2. Lymphedema of lower extremity Patient has a longstanding history of lymphedema and has lymphedema pump.  Given that the area is still tender, I have recommended that she hold off on using a pump until the pain improves but she should continue with use of medical grade compression elevation.   Current Outpatient Medications on File Prior to Visit  Medication Sig Dispense Refill   benzonatate (TESSALON) 100 MG capsule Take 2 capsules (200 mg total) by mouth every 8 (eight) hours. 21 capsule 0   ibuprofen (ADVIL) 600 MG tablet Take 1 tablet (600 mg total) by mouth every 6 (six) hours as needed. 30 tablet 0   ipratropium (ATROVENT) 0.06 % nasal spray Place 2 sprays into both nostrils 4 (four) times daily. 15 mL 12   magnesium oxide (MAG-OX) 400 MG tablet Take by mouth.     medroxyPROGESTERone (DEPO-PROVERA) 150 MG/ML injection Inject 150 mg into the muscle every 3 (three) months. Last injection June 2015     omeprazole (PRILOSEC) 40 MG capsule Take 40 mg by mouth daily.     promethazine-dextromethorphan  (PROMETHAZINE-DM) 6.25-15 MG/5ML syrup Take 5 mLs by mouth 4 (four) times daily as needed. 118 mL 0   sertraline (ZOLOFT) 100 MG tablet Take 2 tablets (200 mg total) by mouth daily. 180 tablet 0   triamterene-hydrochlorothiazide (MAXZIDE-25) 37.5-25 MG tablet Take 0.5 tablets by mouth daily.  6   pregabalin (LYRICA) 100 MG capsule Take 1 capsule by mouth 2 (two) times daily.     rizatriptan (MAXALT-MLT) 10 MG disintegrating tablet Take by mouth.     No current facility-administered medications on file prior to visit.    There are no Patient Instructions on file for this visit. No follow-ups on file.   Georgiana Spinner, NP

## 2023-05-19 ENCOUNTER — Telehealth (INDEPENDENT_AMBULATORY_CARE_PROVIDER_SITE_OTHER): Payer: BC Managed Care – PPO | Admitting: Psychiatry

## 2023-05-19 ENCOUNTER — Encounter: Payer: Self-pay | Admitting: Psychiatry

## 2023-05-19 DIAGNOSIS — F3342 Major depressive disorder, recurrent, in full remission: Secondary | ICD-10-CM | POA: Diagnosis not present

## 2023-05-19 DIAGNOSIS — F411 Generalized anxiety disorder: Secondary | ICD-10-CM | POA: Diagnosis not present

## 2023-05-19 DIAGNOSIS — G4701 Insomnia due to medical condition: Secondary | ICD-10-CM

## 2023-05-19 MED ORDER — SERTRALINE HCL 100 MG PO TABS: 150.0000 mg | ORAL_TABLET | Freq: Every day | ORAL | 1 refills | Status: DC

## 2023-05-19 NOTE — Progress Notes (Signed)
Virtual Visit via Video Note  I connected with Evelyn Flores on 05/19/23 at  8:30 AM EDT by a video enabled telemedicine application and verified that I am speaking with the correct person using two identifiers.  Location Provider Location : [facility] Patient Location : Home  Participants: Patient , Provider    I discussed the limitations of evaluation and management by telemedicine and the availability of in person appointments. The patient expressed understanding and agreed to proceed.   I discussed the assessment and treatment plan with the patient. The patient was provided an opportunity to ask questions and all were answered. The patient agreed with the plan and demonstrated an understanding of the instructions.   The patient was advised to call back or seek an in-person evaluation if the symptoms worsen or if the condition fails to improve as anticipated.   BH MD OP Progress Note  05/19/2023 8:51 AM [name]  MRN:  [id number]  Chief Complaint:  Chief Complaint  Patient presents with   Medication Refill   Follow-up   Depression   HPI: Evelyn Flores is a 37 year old Caucasian female, employed, lives at [facility], has a history of insomnia, MDD, GAD was evaluated by telemedicine today.  Patient today reports she is currently doing well with regards to her depression and anxiety symptoms.  Work is busy.  She reports she has been managing okay.  She denies any significant sadness, hopelessness, anhedonia, anxiety attacks.  Patient reports sleep is overall good.  She has been trying to use the CPAP every night.  She reports when she wakes up in the morning she has definitely noticed an improvement in her energy level.  Patient continues to have chronic pain, history of polyneuropathy, currently takes pregabalin.  Patient is currently compliant on the sertraline.  Denies side effects.  Since her symptoms has been stable since the past several months agreeable to dosage  reduction of sertraline.  Patient denies any suicidality, homicidality or perceptual disturbances.  Patient denies any other concerns today.  Visit Diagnosis:    ICD-10-CM   1. MDD (major depressive disorder), recurrent, in full remission (HCC)  F33.42     2. GAD (generalized anxiety disorder)  F41.1 sertraline (ZOLOFT) 100 MG tablet    3. 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.01    Mood, sleep apnea      Past Psychiatric History: I have reviewed past psychiatric history from progress note on 03/18/2018.  Past Medical History:  Past Medical History:  Diagnosis Date   Anxiety    Depression    DVT (deep venous thrombosis) (HCC)    Ear infection     Past Surgical History:  Procedure Laterality Date   CHOLECYSTECTOMY     VEIN SURGERY Bilateral    vein surgery both legs    Family Psychiatric History: I have reviewed family psychiatric history from progress note on 03/18/2018.  Family History:  Family History  Problem Relation Age of Onset   Panic disorder Mother    Dementia Paternal Grandfather     Social History: I have reviewed social history from progress note on 03/18/2018. Social History   Socioeconomic History   Marital status: Single    Spouse name: Not on file   Number of children: 0   Years of education: Not on file   Highest education level: High school graduate  Occupational History   Occupation: sport endeavors    Comment: fulltime  Tobacco Use   Smoking status: Never  Smokeless tobacco: Never  Vaping Use   Vaping Use: Never used  Substance and Sexual Activity   Alcohol use: No    Alcohol/week: 0.0 standard drinks of alcohol   Drug use: No   Sexual activity: Yes    Birth control/protection: Injection, Condom  Other Topics Concern   Not on file  Social History Narrative   Not on file   Social Determinants of Health   Financial Resource Strain: Low Risk  (11/10/2017)   Overall Financial Resource Strain (CARDIA)    Difficulty of Paying  Living Expenses: Not hard at all  Food Insecurity: No Food Insecurity (11/10/2017)   Hunger Vital Sign    Worried About Running Out of Food in the Last Year: Never true    Ran Out of Food in the Last Year: Never true  Transportation Needs: No Transportation Needs (11/10/2017)   PRAPARE - Administrator, Civil Service (Medical): No    Lack of Transportation (Non-Medical): No  Physical Activity: Insufficiently Active (11/10/2017)   Exercise Vital Sign    Days of Exercise per Week: 3 days    Minutes of Exercise per Session: 30 min  Stress: Stress Concern Present (11/10/2017)   [employer] of Occupational Health - Occupational Stress Questionnaire    Feeling of Stress : Very much  Social Connections: Socially Isolated (11/10/2017)   Social Connection and Isolation Panel [NHANES]    Frequency of Communication with Friends and Family: Once a week    Frequency of Social Gatherings with Friends and Family: Once a week    Attends Religious Services: Never    Database administrator or Organizations: No    Attends Banker Meetings: Never    Marital Status: Never married    Allergies:  Allergies  Allergen Reactions   Misc. Sulfonamide Containing Compounds Other (See Comments) and Hives   Sulfa Antibiotics Swelling, Other (See Comments) and Hives   Buspirone Other (See Comments)    spasms spasms spasms   Influenza Vaccines Other (See Comments)    Very sick and was hospitalized    Topiramate Other (See Comments)    Mental fogginess, tingling, numbness    Metabolic Disorder Labs: No results found for: "HGBA1C", "MPG" No results found for: "PROLACTIN" No results found for: "CHOL", "TRIG", "HDL", "CHOLHDL", "VLDL", "LDLCALC" No results found for: "TSH"  Therapeutic Level Labs: No results found for: "LITHIUM" No results found for: "VALPROATE" No results found for: "CBMZ"  Current Medications: Current Outpatient Medications  Medication Sig Dispense  Refill   ibuprofen (ADVIL) 600 MG tablet Take 1 tablet (600 mg total) by mouth every 6 (six) hours as needed. 30 tablet 0   ipratropium (ATROVENT) 0.06 % nasal spray Place 2 sprays into both nostrils 4 (four) times daily. 15 mL 12   magnesium oxide (MAG-OX) 400 MG tablet Take by mouth.     medroxyPROGESTERone (DEPO-PROVERA) 150 MG/ML injection Inject 150 mg into the muscle every 3 (three) months. Last injection June 2015     omeprazole (PRILOSEC) 40 MG capsule Take 40 mg by mouth daily.     pregabalin (LYRICA) 100 MG capsule Take 1 capsule by mouth 2 (two) times daily.     rizatriptan (MAXALT-MLT) 10 MG disintegrating tablet Take by mouth.     triamterene-hydrochlorothiazide (MAXZIDE-25) 37.5-25 MG tablet Take 0.5 tablets by mouth daily.  6   sertraline (ZOLOFT) 100 MG tablet Take 1.5 tablets (150 mg total) by mouth daily. DOSE CHANGE 135 tablet 1   No  current facility-administered medications for this visit.     Musculoskeletal: Strength & Muscle Tone:  UTA Gait & Station:  Seated Patient leans: N/A  Psychiatric Specialty Exam: Review of Systems  Psychiatric/Behavioral: Negative.      There were no vitals taken for this visit.There is no height or weight on file to calculate BMI.  General Appearance: Fairly Groomed  Eye Contact:  Fair  Speech:  Clear and Coherent  Volume:  Normal  Mood:  Euthymic  Affect:  Congruent  Thought Process:  Goal Directed and Descriptions of Associations: Intact  Orientation:  Full (Time, Place, and Person)  Thought Content: Logical   Suicidal Thoughts:  No  Homicidal Thoughts:  No  Memory:  Immediate;   Fair Recent;   Fair Remote;   Fair  Judgement:  Fair  Insight:  Fair  Psychomotor Activity:  Normal  Concentration:  Concentration: Fair and Attention Span: Fair  Recall:  [employer] of Knowledge: Fair  Language: Fair  Akathisia:  No  Handed:  Right  AIMS (if indicated): not done  Assets:  Communication Skills Desire for  Improvement Housing Social Support  ADL's:  Intact  Cognition: WNL  Sleep:  Fair   Screenings: AIMS    Flowsheet Row Office Visit from 04/07/2022 in [facility]  AIMS Total Score 0      GAD-7    Flowsheet Row Office Visit from 12/24/2022 in [facility] Office Visit from 08/22/2022 in [facility] Office Visit from 12/26/2021 in [facility] Video Visit from 10/14/2021 in [facility] Video Visit from 07/19/2021 in [facility]  Total GAD-7 Score 1 2 0 1 0      PHQ2-9    Flowsheet Row Video Visit from 05/19/2023 in [facility] Office Visit from 12/24/2022 in [facility] Office Visit from 08/22/2022 in [facility] Office Visit from 04/07/2022 in [facility] Office Visit from 12/26/2021 in [facility]  PHQ-2 Total Score 0 1 1 0 0  PHQ-9 Total Score 0 -- 4 1 --      Flowsheet Row Video Visit from 05/19/2023 in [facility] ED from 02/23/2023 in [facility] Visit from 12/24/2022 in [facility]  C-SSRS RISK CATEGORY No Risk Error: Question 6 not populated No Risk        Assessment and Plan: KERAN ROSELLO is a 37 year old Caucasian female, single, employed, lives in [place], has a history of MDD, GAD, obstructive sleep apnea on CPAP, chronic pain, was evaluated by telemedicine today.  Patient currently stable, agreeable to dosage reduction of sertraline.  Plan as noted below.  Plan MDD in remission Reduce sertraline to 150 mg p.o.  daily  GAD-stable Reduce sertraline to 150 mg p.o. daily  Insomnia-stable Continue CPAP for OSA She will need sufficient pain management.   Continue to follow a good sleep hygiene.   Follow-up in clinic in 6 months or sooner in person.  Collaboration of Care: Collaboration of Care: Other patient to continue to follow-up with pain management/primary care provider for pain.  Patient/Guardian was advised Release of Information must be obtained prior to any record release in order to collaborate their care with an outside provider.  Patient/Guardian was advised if they have not already done so to contact the registration department to sign all necessary forms in order for [place] to release information regarding their care.   Consent: Patient/Guardian gives verbal consent for treatment and assignment of benefits for services provided during this visit. Patient/Guardian expressed understanding and agreed to proceed.   This note was generated in part or whole with voice recognition software. Voice recognition is usually quite accurate but there are transcription errors that can and very often do occur. I apologize for any typographical errors that were not detected and corrected.    Jomarie Longs, MD 05/19/2023, 8:51 AM

## 2023-05-28 DIAGNOSIS — H8109 Meniere's disease, unspecified ear: Secondary | ICD-10-CM | POA: Diagnosis not present

## 2023-05-28 DIAGNOSIS — J301 Allergic rhinitis due to pollen: Secondary | ICD-10-CM | POA: Diagnosis not present

## 2023-06-02 DIAGNOSIS — G4733 Obstructive sleep apnea (adult) (pediatric): Secondary | ICD-10-CM | POA: Diagnosis not present

## 2023-06-04 DIAGNOSIS — G4733 Obstructive sleep apnea (adult) (pediatric): Secondary | ICD-10-CM | POA: Diagnosis not present

## 2023-06-09 ENCOUNTER — Other Ambulatory Visit (INDEPENDENT_AMBULATORY_CARE_PROVIDER_SITE_OTHER): Payer: Self-pay | Admitting: Nurse Practitioner

## 2023-06-09 DIAGNOSIS — I8001 Phlebitis and thrombophlebitis of superficial vessels of right lower extremity: Secondary | ICD-10-CM

## 2023-06-12 ENCOUNTER — Ambulatory Visit (INDEPENDENT_AMBULATORY_CARE_PROVIDER_SITE_OTHER): Payer: BC Managed Care – PPO | Admitting: Nurse Practitioner

## 2023-06-12 ENCOUNTER — Ambulatory Visit (INDEPENDENT_AMBULATORY_CARE_PROVIDER_SITE_OTHER): Payer: BC Managed Care – PPO

## 2023-06-12 ENCOUNTER — Encounter (INDEPENDENT_AMBULATORY_CARE_PROVIDER_SITE_OTHER): Payer: Self-pay | Admitting: Nurse Practitioner

## 2023-06-12 VITALS — BP 106/72 | HR 77 | Resp 18 | Ht 66.0 in | Wt 349.5 lb

## 2023-06-12 DIAGNOSIS — I890 Lymphedema, not elsewhere classified: Secondary | ICD-10-CM | POA: Diagnosis not present

## 2023-06-12 DIAGNOSIS — I8001 Phlebitis and thrombophlebitis of superficial vessels of right lower extremity: Secondary | ICD-10-CM | POA: Diagnosis not present

## 2023-06-12 DIAGNOSIS — I809 Phlebitis and thrombophlebitis of unspecified site: Secondary | ICD-10-CM

## 2023-06-13 ENCOUNTER — Encounter (INDEPENDENT_AMBULATORY_CARE_PROVIDER_SITE_OTHER): Payer: Self-pay | Admitting: Nurse Practitioner

## 2023-06-13 NOTE — Progress Notes (Signed)
Subjective:    Patient ID: Evelyn Flores, female    DOB: 1986/02/09, 36 y.o.   MRN: [id number] Chief Complaint  Patient presents with   Follow-up    Follow up 4 week right DVT     Evelyn Flores is a 37 year old female who presents today for evaluation of a superficial phlebitis to the right lower extremity.  She contacted our office due to having tenderness and pain in the calf area with wearing the patient's wound across the calf area.  She has a previous history of DVT in the right lower extremity although were not certain exactly when this was.  She also has a history of lymphedema and currently wears medical grade compression and using lymphedema pump daily daily.  She denies any specific trauma to the area.  She had an ultrasound several weeks ago which revealed the thrombophlebitis in the great saphenous vein as well as small saphenous vein.  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  At that time she was instructed to begin utilizing a daily baby aspirin in addition to warm compresses of her leg.  She notes that the pain and tenderness she initially felt is much better she still has some achiness but feels this is due more so to the underlying lymphedema.  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  Previous DVT is not detected.  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  This is an improvement from the previous studies.      Review of Systems  Cardiovascular:  Positive for leg swelling.  All other systems reviewed and are negative.      Objective:   Physical Exam Vitals reviewed.  HENT:     Head: Normocephalic.  Cardiovascular:     Rate and Rhythm: Normal rate.  Pulmonary:     Effort: Pulmonary effort is normal.  Musculoskeletal:     Right lower leg: Edema present.     Left lower leg: Edema present.  Skin:    General: Skin is warm and dry.  Neurological:      Mental Status: She is alert and oriented to person, place, and time.  Psychiatric:        Mood and Affect: Mood normal.        Behavior: Behavior normal.        Thought Content: Thought content normal.        Judgment: Judgment normal.     BP 106/72 (BP Location: Left Arm)   Pulse 77   Resp 18   Ht 5\' 6"  (1.676 m)   Wt (!) 349 lb 8 oz (158.5 kg)   BMI 56.41 kg/m   Past Medical History:  Diagnosis Date   Anxiety    Depression    DVT (deep venous thrombosis) (HCC)    Ear infection     Social History   Socioeconomic History   Marital status: Single    Spouse name: Not on file   Number of children: 0   Years of education: Not on file   Highest education level: High school graduate  Occupational History   Occupation: sport endeavors    Comment: fulltime  Tobacco Use   Smoking status: Never   Smokeless tobacco: Never  Vaping Use   Vaping Use: Never used  Substance and Sexual Activity   Alcohol use: No    Alcohol/week: 0.0 standard drinks of alcohol   Drug use:  No   Sexual activity: Yes    Birth control/protection: Injection, Condom  Other Topics Concern   Not on file  Social History Narrative   Not on file   Social Determinants of Health   Financial Resource Strain: Low Risk  (11/10/2017)   Overall Financial Resource Strain (CARDIA)    Difficulty of Paying Living Expenses: Not hard at all  Food Insecurity: No Food Insecurity (11/10/2017)   Hunger Vital Sign    Worried About Running Out of Food in the Last Year: Never true    Ran Out of Food in the Last Year: Never true  Transportation Needs: No Transportation Needs (11/10/2017)   PRAPARE - Administrator, Civil Service (Medical): No    Lack of Transportation (Non-Medical): No  Physical Activity: Insufficiently Active (11/10/2017)   Exercise Vital Sign    Days of Exercise per Week: 3 days    Minutes of Exercise per Session: 30 min  Stress: Stress Concern Present (11/10/2017)   [employer] of Occupational Health - Occupational Stress Questionnaire    Feeling of Stress : Very much  Social Connections: Socially Isolated (11/10/2017)   Social Connection and Isolation Panel [NHANES]    Frequency of Communication with Friends and Family: Once a week    Frequency of Social Gatherings with Friends and Family: Once a week    Attends Religious Services: Never    Database administrator or Organizations: No    Attends Banker Meetings: Never    Marital Status: Never married  Intimate Partner Violence: Not At Risk (11/10/2017)   Humiliation, Afraid, Rape, and Kick questionnaire    Fear of Current or Ex-Partner: No    Emotionally Abused: No    Physically Abused: No    Sexually Abused: No    Past Surgical History:  Procedure Laterality Date   CHOLECYSTECTOMY     VEIN SURGERY Bilateral    vein surgery both legs    Family History  Problem Relation Age of Onset   Panic disorder Mother    Dementia Paternal Grandfather     Allergies  Allergen Reactions   Misc. Sulfonamide Containing Compounds Other (See Comments) and Hives   Sulfa Antibiotics Swelling, Other (See Comments) and Hives   Buspirone Other (See Comments)    spasms spasms spasms   Influenza Vaccines Other (See Comments)    Very sick and was hospitalized    Topiramate Other (See Comments)    Mental fogginess, tingling, numbness       Latest Ref Rng & Units 08/06/2022    3:19 PM 10/17/2021    8:13 PM 01/26/2019    9:59 PM  CBC  WBC 4.0 - 10.5 K/uL 7.7  9.0  9.1   Hemoglobin 12.0 - 15.0 g/dL 16.1  09.6  04.5   Hematocrit 36.0 - 46.0 % 40.9  39.5  39.0   Platelets 150 - 400 K/uL 278  229  228       CMP     Component Value Date/Time   NA 138 08/06/2022 1519   NA 140 12/15/2014 2220   K 3.1 (L) 08/06/2022 1519   K 3.6 12/15/2014 2220   CL 105 08/06/2022 1519   CL 106 12/15/2014 2220   CO2 26 08/06/2022 1519   CO2 29 12/15/2014 2220   GLUCOSE 95 08/06/2022 1519   GLUCOSE 85  12/15/2014 2220   BUN 12 08/06/2022 1519   BUN 12 12/15/2014 2220   CREATININE 0.90 08/06/2022 1519  CREATININE 0.94 12/15/2014 2220   CALCIUM 9.6 08/06/2022 1519   CALCIUM 9.0 12/15/2014 2220   PROT 8.3 (H) 08/06/2022 1519   PROT 7.3 12/15/2014 2220   ALBUMIN 4.1 08/06/2022 1519   ALBUMIN 3.6 12/15/2014 2220   AST 22 08/06/2022 1519   AST 12 (L) 12/15/2014 2220   ALT 27 08/06/2022 1519   ALT 24 12/15/2014 2220   ALKPHOS 57 08/06/2022 1519   ALKPHOS 70 12/15/2014 2220   BILITOT 0.3 08/06/2022 1519   BILITOT 0.2 12/15/2014 2220   GFRNONAA >60 08/06/2022 1519   GFRNONAA >60 12/15/2014 2220   GFRAA >60 01/26/2019 2159   GFRAA >60 12/15/2014 2220     No results found.     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  In order to help facilitate the continued healing I advised the patient continue with aspirin.  She has a follow-up in several months and we will evaluate her progress at that time.  2. Lymphedema of lower extremity Patient is advised to resume her full activities for conservative treatment and management of her lymphedema.  This includes use of medical grade compression stockings.  She should also continue to elevate her legs when not active in addition to activity.  She is also authorized to begin use of her lymphedema pump.   Current Outpatient Medications on File Prior to Visit  Medication Sig Dispense Refill   ibuprofen (ADVIL) 600 MG tablet Take 1 tablet (600 mg total) by mouth every 6 (six) hours as needed. 30 tablet 0   ipratropium (ATROVENT) 0.06 % nasal spray Place 2 sprays into both nostrils 4 (four) times daily. 15 mL 12   magnesium oxide (MAG-OX) 400 MG tablet Take by mouth.     medroxyPROGESTERone (DEPO-PROVERA) 150 MG/ML injection Inject 150 mg into the muscle every 3 (three) months. Last injection June 2015     omeprazole (PRILOSEC) 40 MG capsule Take 40 mg by mouth daily.     pregabalin (LYRICA) 100 MG capsule  Take 1 capsule by mouth 2 (two) times daily.     rizatriptan (MAXALT-MLT) 10 MG disintegrating tablet Take by mouth.     sertraline (ZOLOFT) 100 MG tablet Take 1.5 tablets (150 mg total) by mouth daily. DOSE CHANGE 135 tablet 1   triamterene-hydrochlorothiazide (MAXZIDE-25) 37.5-25 MG tablet Take 0.5 tablets by mouth daily.  6   No current facility-administered medications on file prior to visit.    There are no Patient Instructions on file for this visit. No follow-ups on file.   Georgiana Spinner, NP

## 2023-07-02 DIAGNOSIS — G4733 Obstructive sleep apnea (adult) (pediatric): Secondary | ICD-10-CM | POA: Diagnosis not present

## 2023-07-11 ENCOUNTER — Ambulatory Visit
Admission: EM | Admit: 2023-07-11 | Discharge: 2023-07-11 | Disposition: A | Discharge status: Home / Self Care | Payer: BC Managed Care – PPO | Source: Home / Self Care

## 2023-07-11 DIAGNOSIS — H9202 Otalgia, left ear: Secondary | ICD-10-CM | POA: Diagnosis not present

## 2023-07-11 DIAGNOSIS — R110 Nausea: Secondary | ICD-10-CM

## 2023-07-11 DIAGNOSIS — A084 Viral intestinal infection, unspecified: Secondary | ICD-10-CM

## 2023-07-11 DIAGNOSIS — E876 Hypokalemia: Secondary | ICD-10-CM | POA: Diagnosis not present

## 2023-07-11 DIAGNOSIS — E860 Dehydration: Secondary | ICD-10-CM | POA: Diagnosis not present

## 2023-07-11 DIAGNOSIS — R1084 Generalized abdominal pain: Secondary | ICD-10-CM | POA: Diagnosis not present

## 2023-07-11 DIAGNOSIS — Z1152 Encounter for screening for COVID-19: Secondary | ICD-10-CM | POA: Diagnosis not present

## 2023-07-11 LAB — CBC WITH DIFFERENTIAL/PLATELET
Abs Immature Granulocytes: 0.03 10*3/uL (ref 0.00–0.07)
Basophils Absolute: 0.1 10*3/uL (ref 0.0–0.1)
Basophils Relative: 1 %
Eosinophils Absolute: 0.2 10*3/uL (ref 0.0–0.5)
Eosinophils Relative: 2 %
HCT: 38.6 % (ref 36.0–46.0)
Hemoglobin: 13.2 g/dL (ref 12.0–15.0)
Immature Granulocytes: 0 %
Lymphocytes Relative: 25 %
Lymphs Abs: 2 10*3/uL (ref 0.7–4.0)
MCH: 27.4 pg (ref 26.0–34.0)
MCHC: 34.2 g/dL (ref 30.0–36.0)
MCV: 80.2 fL (ref 80.0–100.0)
Monocytes Absolute: 0.8 10*3/uL (ref 0.1–1.0)
Monocytes Relative: 9 %
Neutro Abs: 5 10*3/uL (ref 1.7–7.7)
Neutrophils Relative %: 63 %
Platelets: 247 10*3/uL (ref 150–400)
RBC: 4.81 MIL/uL (ref 3.87–5.11)
RDW: 14.1 % (ref 11.5–15.5)
WBC: 8.1 10*3/uL (ref 4.0–10.5)
nRBC: 0 % (ref 0.0–0.2)

## 2023-07-11 LAB — COMPREHENSIVE METABOLIC PANEL
ALT: 24 U/L (ref 0–44)
AST: 27 U/L (ref 15–41)
Albumin: 4.1 g/dL (ref 3.5–5.0)
Alkaline Phosphatase: 59 U/L (ref 38–126)
Anion gap: 11 (ref 5–15)
BUN: 24 mg/dL — ABNORMAL HIGH (ref 6–20)
CO2: 24 mmol/L (ref 22–32)
Calcium: 9.4 mg/dL (ref 8.9–10.3)
Chloride: 104 mmol/L (ref 98–111)
Creatinine, Ser: 1.47 mg/dL — ABNORMAL HIGH (ref 0.44–1.00)
GFR, Estimated: 46 mL/min — ABNORMAL LOW (ref >60)
Glucose, Bld: 103 mg/dL — ABNORMAL HIGH (ref 70–99)
Potassium: 3.2 mmol/L — ABNORMAL LOW (ref 3.5–5.1)
Sodium: 139 mmol/L (ref 135–145)
Total Bilirubin: 0.9 mg/dL (ref 0.3–1.2)
Total Protein: 7.9 g/dL (ref 6.5–8.1)

## 2023-07-11 LAB — URINALYSIS, ROUTINE W REFLEX MICROSCOPIC
Bilirubin Urine: NEGATIVE
Glucose, UA: NEGATIVE mg/dL
Hgb urine dipstick: NEGATIVE
Ketones, ur: NEGATIVE mg/dL
Leukocytes,Ua: NEGATIVE
Nitrite: NEGATIVE
Protein, ur: NEGATIVE mg/dL
Specific Gravity, Urine: 1.015 (ref 1.005–1.030)
pH: 7 (ref 5.0–8.0)

## 2023-07-11 LAB — SARS CORONAVIRUS 2 BY RT PCR: SARS Coronavirus 2 by RT PCR: NEGATIVE

## 2023-07-11 LAB — PREGNANCY, URINE: Preg Test, Ur: NEGATIVE

## 2023-07-11 MED ORDER — ACETAMINOPHEN 325 MG PO TABS
975.0000 mg | ORAL_TABLET | Freq: Once | ORAL | Status: AC
  Administered 2023-07-11: 975 mg via ORAL

## 2023-07-11 MED ORDER — ONDANSETRON 4 MG PO TBDP: 4.0000 mg | ORAL_TABLET | Freq: Four times a day (QID) | ORAL | 0 refills | Status: DC | PRN

## 2023-07-11 MED ORDER — POTASSIUM CHLORIDE CRYS ER 20 MEQ PO TBCR: 20.0000 meq | EXTENDED_RELEASE_TABLET | Freq: Two times a day (BID) | ORAL | 0 refills | Status: DC

## 2023-07-11 MED ORDER — DICYCLOMINE HCL 20 MG PO TABS: 20.0000 mg | ORAL_TABLET | Freq: Three times a day (TID) | ORAL | 0 refills | Status: DC

## 2023-07-11 MED ORDER — ONDANSETRON 8 MG PO TBDP
8.0000 mg | ORAL_TABLET | Freq: Once | ORAL | Status: AC
  Administered 2023-07-11: 8 mg via ORAL

## 2023-07-11 MED ORDER — SODIUM CHLORIDE 0.9 % IV BOLUS
1000.0000 mL | Freq: Once | INTRAVENOUS | Status: AC
  Administered 2023-07-11: 1000 mL via INTRAVENOUS

## 2023-07-11 NOTE — Discharge Instructions (Signed)

## 2023-07-11 NOTE — ED Provider Notes (Addendum)
[facility]    CSN: [id number] Arrival date & time: 07/11/23  1241      History   Chief Complaint Chief Complaint  Patient presents with   Abdominal Pain   Ear Pain    HPI Evelyn Flores is a 37 y.o. female presenting for multiple complaints.  Patient reports left ear pain x 2 days. Also reports a mild cough and dry heaving.Denies fever, sore throat, congestion, chest pain, SOB, dysuria, diarrhea, constipation.   Patient says she has been having lower abdominal pain/cramping, low back pain, and nausea. Also reports frequent urination and trips to the bathroom to have small bowel movements. Denies vaginal discharge.  Denies sick contacts.  Has been taking Tylenol with the last dose last night.  Says she cannot take NSAIDs because that caused her to have nausea, sweats and "turn gray."  Medical history significant for anxiety, depression, previous DVT, obesity, migraines, GERD, hypertension, and lymphedema. Patient has had a cholecystectomy.   HPI  Past Medical History:  Diagnosis Date   Anxiety    Depression    DVT (deep venous thrombosis) (HCC)    Ear infection     Patient Active Problem List   Diagnosis Date Noted   Dysphagia 05/02/2022   Epigastric pain 05/02/2022   Lymphedema 12/03/2020   Plantar fasciitis of left foot 02/16/2020   Lymphedema of lower extremity 12/07/2019   Essential hypertension 11/24/2019   Uterine cramping 06/28/2019   MDD (major depressive disorder), recurrent, in full remission (HCC) 06/28/2019   Insomnia 06/28/2019   GAD (generalized anxiety disorder) 06/28/2019   Bereavement 06/28/2019   MDD (major depressive disorder), recurrent episode, mild (HCC) 03/18/2018   Varicose veins with pain 02/18/2017   Venous insufficiency 12/05/2015   Chronic deep vein thrombosis (DVT) of femoral vein of right lower extremity (HCC) 11/01/2015   Gastroesophageal reflux disease without esophagitis 11/01/2015   Leg swelling 11/01/2015    PCOS (polycystic ovarian syndrome) 11/01/2015   Obesity, Class III, BMI 40-49.9 (morbid obesity) (HCC) 11/01/2015   Allergic rhinitis due to pollen 11/01/2015   Sensorineural hearing loss of both ears 11/01/2015   Tinnitus 11/01/2015   Abnormal EKG 03/08/2015   Chest pressure 03/08/2015   Swelling of left lower extremity 10/11/2014   Neoplasm of uncertain behavior of skin 09/19/2014   Abdominal pain 03/14/2014   Diarrhea, unspecified 03/14/2014   GERD (gastroesophageal reflux disease) 03/14/2014   Morbid obesity (HCC) 03/14/2014   Tibial tendonitis, posterior 08/24/2013   History of blood clots 07/01/2013   Paresthesias 12/29/2012   Meniere's disease 10/18/2012   Migraine 10/18/2012    Past Surgical History:  Procedure Laterality Date   CHOLECYSTECTOMY     VEIN SURGERY Bilateral    vein surgery both legs    OB History   No obstetric history on file.      Home Medications    Prior to Admission medications   Medication Sig Start Date End Date Taking? Authorizing Provider  dicyclomine (BENTYL) 20 MG tablet Take 1 tablet (20 mg total) by mouth 4 (four) times daily -  before meals and at bedtime. 07/11/23  Yes Eusebio Friendly B, PA-C  ibuprofen (ADVIL) 600 MG tablet Take 1 tablet (600 mg total) by mouth every 6 (six) hours as needed. 08/06/22  Yes Domenick Gong, MD  ipratropium (ATROVENT) 0.06 % nasal spray Place 2 sprays into both nostrils 4 (four) times daily. 02/23/23  Yes Becky Augusta, NP  magnesium oxide (MAG-OX) 400 MG tablet Take by mouth.  Yes [provider]  medroxyPROGESTERone (DEPO-PROVERA) 150 MG/ML injection Inject 150 mg into the muscle every 3 (three) months. Last injection June 2015   Yes [provider]  omeprazole (PRILOSEC) 40 MG capsule Take 40 mg by mouth daily.   Yes [provider]  ondansetron (ZOFRAN-ODT) 4 MG disintegrating tablet Take 1 tablet (4 mg total) by mouth every 6 (six) hours as needed for nausea or vomiting. 07/11/23   Yes Eusebio Friendly B, PA-C  potassium chloride SA (KLOR-CON M) 20 MEQ tablet Take 1 tablet (20 mEq total) by mouth 2 (two) times daily for 5 days. 07/11/23 07/16/23 Yes Eusebio Friendly B, PA-C  pregabalin (LYRICA) 100 MG capsule Take 1 capsule by mouth 2 (two) times daily. 10/11/21  Yes [provider]  rizatriptan (MAXALT-MLT) 10 MG disintegrating tablet Take by mouth. 02/26/22  Yes [provider]  sertraline (ZOLOFT) 100 MG tablet Take 1.5 tablets (150 mg total) by mouth daily. DOSE CHANGE 05/19/23 11/15/23 Yes Jomarie Longs, MD  triamterene-hydrochlorothiazide (MAXZIDE-25) 37.5-25 MG tablet Take 0.5 tablets by mouth daily. 04/24/18  Yes [provider]    Family History Family History  Problem Relation Age of Onset   Panic disorder Mother    Dementia Paternal Grandfather     Social History Social History   Tobacco Use   Smoking status: Never   Smokeless tobacco: Never  Vaping Use   Vaping status: Never Used  Substance Use Topics   Alcohol use: No    Alcohol/week: 0.0 standard drinks of alcohol   Drug use: No     Allergies   Misc. sulfonamide containing compounds, Sulfa antibiotics, Buspirone, Influenza vaccines, and Topiramate   Review of Systems Review of Systems  Constitutional:  Positive for appetite change. Negative for fatigue and fever.  HENT:  Positive for ear pain. Negative for congestion, ear discharge, hearing loss, rhinorrhea, sinus pain and sore throat.   Respiratory:  Positive for cough. Negative for shortness of breath.   Cardiovascular:  Negative for chest pain.  Gastrointestinal:  Positive for abdominal pain and nausea. Negative for constipation, diarrhea and vomiting.  Genitourinary:  Negative for difficulty urinating, dysuria, flank pain, frequency and vaginal discharge.  Musculoskeletal:  Positive for back pain. Negative for gait problem.  Neurological:  Negative for weakness, numbness and headaches.     Physical Exam Triage  Vital Signs ED Triage Vitals  Encounter Vitals Group     BP 07/11/23 1256 130/86     Systolic BP Percentile --      Diastolic BP Percentile --      Pulse Rate 07/11/23 1256 91     Resp --      Temp 07/11/23 1256 98.9 F (37.2 C)     Temp Source 07/11/23 1256 Oral     SpO2 07/11/23 1256 97 %     Weight 07/11/23 1254 (!) 350 lb (158.8 kg)     Height 07/11/23 1254 5\' 6"  (1.676 m)     Head Circumference --      Peak Flow --      Pain Score 07/11/23 1253 8     Pain Loc --      Pain Education --      Exclude from Growth Chart --    No data found.  Updated Vital Signs BP 130/86 (BP Location: Left Arm)   Pulse 91   Temp 98.9 F (37.2 C) (Oral)   Ht 5\' 6"  (1.676 m)   Wt (!) 350 lb (158.8 kg)  SpO2 97%   BMI 56.49 kg/m    Physical Exam Vitals and nursing note reviewed.  Constitutional:      General: She is not in acute distress.    Appearance: Normal appearance. She is obese. She is ill-appearing. She is not toxic-appearing.  HENT:     Head: Normocephalic and atraumatic.     Right Ear: Tympanic membrane, ear canal and external ear normal.     Left Ear: Tympanic membrane, ear canal and external ear normal.     Nose: Nose normal.     Mouth/Throat:     Mouth: Mucous membranes are moist.     Pharynx: Oropharynx is clear.  Eyes:     General: No scleral icterus.       Right eye: No discharge.        Left eye: No discharge.     Conjunctiva/sclera: Conjunctivae normal.  Cardiovascular:     Rate and Rhythm: Normal rate and regular rhythm.     Heart sounds: Normal heart sounds.  Pulmonary:     Effort: Pulmonary effort is normal. No respiratory distress.     Breath sounds: Normal breath sounds.  Abdominal:     General: Abdomen is protuberant.     Palpations: Abdomen is soft.     Tenderness: There is generalized abdominal tenderness. There is no right CVA tenderness, left CVA tenderness, guarding or rebound.  Musculoskeletal:     Cervical back: Neck supple.     Lumbar  back: Tenderness (TTP bilateral paralumbar muscles) present. Decreased range of motion. Negative right straight leg raise test and negative left straight leg raise test.  Skin:    General: Skin is dry.  Neurological:     General: No focal deficit present.     Mental Status: She is alert. Mental status is at baseline.     Motor: No weakness.     Gait: Gait normal.  Psychiatric:        Mood and Affect: Mood is anxious. Affect is tearful.     Comments: Increased respiratory rate, mild hyperventilation and is tearful.      UC Treatments / Results  Labs (all labs ordered are listed, but only abnormal results are displayed) Labs Reviewed  COMPREHENSIVE METABOLIC PANEL - Abnormal; Notable for the following components:      Result Value   Potassium 3.2 (*)    Glucose, Bld 103 (*)    BUN 24 (*)    Creatinine, Ser 1.47 (*)    GFR, Estimated 46 (*)    All other components within normal limits  SARS CORONAVIRUS 2 BY RT PCR  URINALYSIS, ROUTINE W REFLEX MICROSCOPIC  PREGNANCY, URINE  CBC WITH DIFFERENTIAL/PLATELET    EKG   Radiology No results found.  Procedures Procedures (including critical care time)  Medications Ordered in UC Medications  ondansetron (ZOFRAN-ODT) disintegrating tablet 8 mg (8 mg Oral Given 07/11/23 1325)  acetaminophen (TYLENOL) tablet 975 mg (975 mg Oral Given 07/11/23 1325)  sodium chloride 0.9 % bolus 1,000 mL (0 mLs Intravenous Stopped 07/11/23 1504)    Initial Impression / Assessment and Plan / UC Course  I have reviewed the triage vital signs and the nursing notes.  Pertinent labs & imaging results that were available during my care of the patient were reviewed by me and considered in my medical decision making (see chart for details).   37 year old female presents for multiple complaints including left-sided ear pain, mild cough, nausea with dry heaving, lower abdominal pain, lower back pain,  frequent urination and fatigue.  Denies sore throat,  congestion, chest pain, shortness of breath, dysuria, diarrhea or constipation.  No sick contacts.  Vitals are all normal and stable.  Patient is ill-appearing.  She is anxious appearing.  Nontoxic.  She is slow-moving, somewhat avoids eye contact, is mildly hyperventilating and tearful.  No evidence of an ear infection or cerumen impaction on exam.  Chest clear to auscultation heart regular rate and rhythm.  Abdomen soft with generalized tenderness palpation.  No CVA tenderness.  Tenderness throughout the paralumbar muscles bilaterally.  Patient given 975 mg Tylenol in clinic for acute pain relief.  Also given 8 mg ODT Zofran for nausea.  CBC, CMP, COVID test, urine pregnancy test, and urinalysis obtained.  UA normal.  Urine pregnancy negative.  COVID test negative. CBC normal.  CMP shows creatinine 1.47, GFR 46, BUN 24.  Also potassium 3.2.  This compared to CMP from nearly 1 year ago which showed potassium of 3.1, BUN 12, GFR greater than 60 and creatinine 0.9.  Reviewed all results with patient.  Given her reduced renal function and signs of dehydration advised IV fluids.  Patient is agreeable.  Patient given bolus of 0.9 normal saline.  Patient reported improvement in back pain and nausea after medications.  Suspect viral gastroenteritis and dehydration.  Patient advised of supportive care.  Sent dicyclomine and Zofran to pharmacy.  Also has hypokalemia which is not new.  Sent potassium supplement for the next few days.  Encouraged increasing fluids and electrolytes.  Reviewed return and ER precautions.  1 acute illness with systemic symptoms.  Final Clinical Impressions(s) / UC Diagnoses   Final diagnoses:  Viral gastroenteritis  Generalized abdominal pain  Nausea  Otalgia of left ear  Hypokalemia  Dehydration     Discharge Instructions      ABDOMINAL PAIN: You may take Tylenol for pain relief. Use medications as directed including antiemetics and antidiarrheal medications if  suggested or prescribed. You should increase fluids and electrolytes as well as rest over these next several days. If you have any questions or concerns, or if your symptoms are not improving or if especially if they acutely worsen, please call or stop back to the clinic immediately and we will be happy to help you or go to the ER   ABDOMINAL PAIN RED FLAGS: Seek immediate further care if: symptoms remain the same or worsen over the next 3-7 days, you are unable to keep fluids down, you see blood or mucus in your stool, you vomit black or dark red material, you have a fever of 101.F or higher, you have localized and/or persistent abdominal pain       ED Prescriptions     Medication Sig Dispense Auth. Provider   ondansetron (ZOFRAN-ODT) 4 MG disintegrating tablet Take 1 tablet (4 mg total) by mouth every 6 (six) hours as needed for nausea or vomiting. 15 tablet Eusebio Friendly B, PA-C   dicyclomine (BENTYL) 20 MG tablet Take 1 tablet (20 mg total) by mouth 4 (four) times daily -  before meals and at bedtime. 20 tablet Eusebio Friendly B, PA-C   potassium chloride SA (KLOR-CON M) 20 MEQ tablet Take 1 tablet (20 mEq total) by mouth 2 (two) times daily for 5 days. 10 tablet Gareth Morgan      PDMP not reviewed this encounter.     Shirlee Latch, PA-C 07/11/23 [phone]

## 2023-07-11 NOTE — ED Triage Notes (Signed)
Pt c/o ear pain x2days and back pain x1day  Pt states she is having lower back pain, abdominal pain, nausea,   Pt states that the pain is mostly in the middle of her back.   Pt denies any exercise or different movements.  Pt was at work and sitting down when the pain started.

## 2023-07-29 DIAGNOSIS — Z3042 Encounter for surveillance of injectable contraceptive: Secondary | ICD-10-CM | POA: Diagnosis not present

## 2023-08-02 DIAGNOSIS — G4733 Obstructive sleep apnea (adult) (pediatric): Secondary | ICD-10-CM | POA: Diagnosis not present

## 2023-08-03 DIAGNOSIS — Z1331 Encounter for screening for depression: Secondary | ICD-10-CM | POA: Diagnosis not present

## 2023-08-03 DIAGNOSIS — Z1339 Encounter for screening examination for other mental health and behavioral disorders: Secondary | ICD-10-CM | POA: Diagnosis not present

## 2023-08-03 DIAGNOSIS — Z01419 Encounter for gynecological examination (general) (routine) without abnormal findings: Secondary | ICD-10-CM | POA: Diagnosis not present

## 2023-10-04 NOTE — Progress Notes (Unsigned)
MRN : [id number]  Evelyn Flores is a 37 y.o. (03-31-1986) female who presents with chief complaint of legs hurt and swell.  History of Present Illness:   The patient returns to the office for followup evaluation regarding leg swelling.  The swelling has improved quite a bit and the pain associated with swelling has decreased substantially. There have not been any interval development of a ulcerations or wounds.  Since the previous visit the patient has been wearing graduated compression stockings and has noted some improvement in the lymphedema. The patient has been using compression routinely morning until night.  The patient also states elevation during the day and exercise (such as walking) is being done too.   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  Previous DVT is not detected.  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  This is an improvement from the previous studies.   No outpatient medications have been marked as taking for the 10/05/23 encounter (Appointment) with Gilda Crease, Latina Craver, MD.    Past Medical History:  Diagnosis Date   Anxiety    Depression    DVT (deep venous thrombosis) (HCC)    Ear infection     Past Surgical History:  Procedure Laterality Date   CHOLECYSTECTOMY     VEIN SURGERY Bilateral    vein surgery both legs    Social History Social History   Tobacco Use   Smoking status: Never   Smokeless tobacco: Never  Vaping Use   Vaping status: Never Used  Substance Use Topics   Alcohol use: No    Alcohol/week: 0.0 standard drinks of alcohol   Drug use: No    Family History Family History  Problem Relation Age of Onset   Panic disorder Mother    Dementia Paternal Grandfather     Allergies  Allergen Reactions   Misc. Sulfonamide Containing Compounds Other (See Comments) and Hives   Sulfa Antibiotics Swelling, Other (See Comments) and Hives    Buspirone Other (See Comments)    spasms spasms spasms   Influenza Vaccines Other (See Comments)    Very sick and was hospitalized    Topiramate Other (See Comments)    Mental fogginess, tingling, numbness     REVIEW OF SYSTEMS (Negative unless checked)  Constitutional: [] Weight loss  [] Fever  [] Chills Cardiac: [] Chest pain   [] Chest pressure   [] Palpitations   [] Shortness of breath when laying flat   [] Shortness of breath with exertion. Vascular:  [] Pain in legs with walking   [x] Pain in legs at rest  [] History of DVT   [] Phlebitis   [x] Swelling in legs   [] Varicose veins   [] Non-healing ulcers Pulmonary:   [] Uses home oxygen   [] Productive cough   [] Hemoptysis   [] Wheeze  [] COPD   [] Asthma Neurologic:  [] Dizziness   [] Seizures   [] History of stroke   [] History of TIA  [] Aphasia   [] Vissual changes   [] Weakness or numbness in arm   [] Weakness or numbness in leg Musculoskeletal:   [] Joint swelling   [] Joint pain   [] Low back pain Hematologic:  [] Easy bruising  [] Easy bleeding   [] Hypercoagulable state   [] Anemic Gastrointestinal:  [] Diarrhea   [] Vomiting  [x] Gastroesophageal reflux/heartburn   [] Difficulty swallowing. Genitourinary:  [] Chronic kidney disease   [] Difficult urination  [] Frequent urination   [] Blood in urine Skin:  [] Rashes   [] Ulcers  Psychological:  [] History of anxiety   []   History of major depression.  Physical Examination  There were no vitals filed for this visit. There is no height or weight on file to calculate BMI. Gen: WD/WN, NAD Head: [place]/AT, No temporalis wasting.  Ear/Nose/Throat: Hearing grossly intact, nares w/o erythema or drainage, pinna without lesions Eyes: PER, EOMI, sclera nonicteric.  Neck: Supple, no gross masses.  No JVD.  Pulmonary:  Good air movement, no audible wheezing, no use of accessory muscles.  Cardiac: RRR, precordium not hyperdynamic. Vascular:  scattered varicosities present bilaterally.  Moderate venous stasis changes to the legs  bilaterally.  2+ soft pitting edema. CEAP [id number]   Vessel Right Left  Radial Palpable Palpable  Gastrointestinal: soft, non-distended. No guarding/no peritoneal signs.  Musculoskeletal: M/S 5/5 throughout.  No deformity.  Neurologic: CN 2-12 intact. Pain and light touch intact in extremities.  Symmetrical.  Speech is fluent. Motor exam as listed above. Psychiatric: Judgment intact, Mood & affect appropriate for pt's clinical situation. Dermatologic: Venous rashes no ulcers noted.  No changes consistent with cellulitis. Lymph : No lichenification or skin changes of chronic lymphedema.  CBC Lab Results  Component Value Date   WBC 8.1 07/11/2023   HGB 13.2 07/11/2023   HCT 38.6 07/11/2023   MCV 80.2 07/11/2023   PLT 247 07/11/2023    BMET    Component Value Date/Time   NA 139 07/11/2023 1329   NA 140 12/15/2014 2220   K 3.2 (L) 07/11/2023 1329   K 3.6 12/15/2014 2220   CL 104 07/11/2023 1329   CL 106 12/15/2014 2220   CO2 24 07/11/2023 1329   CO2 29 12/15/2014 2220   GLUCOSE 103 (H) 07/11/2023 1329   GLUCOSE 85 12/15/2014 2220   BUN 24 (H) 07/11/2023 1329   BUN 12 12/15/2014 2220   CREATININE 1.47 (H) 07/11/2023 1329   CREATININE 0.94 12/15/2014 2220   CALCIUM 9.4 07/11/2023 1329   CALCIUM 9.0 12/15/2014 2220   GFRNONAA 46 (L) 07/11/2023 1329   GFRNONAA >60 12/15/2014 2220   GFRAA >60 01/26/2019 2159   GFRAA >60 12/15/2014 2220   CrCl cannot be calculated (Patient's most recent lab result is older than the maximum 21 days allowed.).  COAG No results found for: "INR", "PROTIME"  Radiology No results found.   Assessment/Plan 1. Chronic deep vein thrombosis (DVT) of femoral vein of right lower extremity (HCC) Recommend:  No surgery or intervention at this point in time.    I have reviewed my discussion with the patient regarding lymphedema and why it  causes symptoms.  Patient will continue wearing graduated compression on a daily basis. The patient should put  the compression on first thing in the morning and removing them in the evening. The patient should not sleep in the compression.   In addition, behavioral modification throughout the day will be continued.  This will include frequent elevation (such as in a recliner), use of over the counter pain medications as needed and exercise such as walking.  The systemic causes for chronic edema such as liver, kidney and cardiac etiologies does not appear to have significant changed over the past year.    The patient will continue aggressive use of the  lymph pump.  This will continue to improve the edema control and prevent sequela such as ulcers and infections.   The patient will follow-up with me on an annual basis.    A total of 30 minutes was spent with this patient and greater than 50% was spent in counseling and coordination of  care with the patient.  Discussion included the treatment options for vascular disease including indications for surgery and intervention.  Also discussed is the appropriate timing of treatment.  In addition medical therapy was discussed.  2. Venous insufficiency Recommend:  No surgery or intervention at this point in time.    I have reviewed my discussion with the patient regarding lymphedema and why it  causes symptoms.  Patient will continue wearing graduated compression on a daily basis. The patient should put the compression on first thing in the morning and removing them in the evening. The patient should not sleep in the compression.   In addition, behavioral modification throughout the day will be continued.  This will include frequent elevation (such as in a recliner), use of over the counter pain medications as needed and exercise such as walking.  The systemic causes for chronic edema such as liver, kidney and cardiac etiologies does not appear to have significant changed over the past year.    The patient will continue aggressive use of the  lymph pump.  This  will continue to improve the edema control and prevent sequela such as ulcers and infections.   The patient will follow-up with me on an annual basis.   3. Lymphedema Recommend:  No surgery or intervention at this point in time.    I have reviewed my discussion with the patient regarding lymphedema and why it  causes symptoms.  Patient will continue wearing graduated compression on a daily basis. The patient should put the compression on first thing in the morning and removing them in the evening. The patient should not sleep in the compression.   In addition, behavioral modification throughout the day will be continued.  This will include frequent elevation (such as in a recliner), use of over the counter pain medications as needed and exercise such as walking.  The systemic causes for chronic edema such as liver, kidney and cardiac etiologies does not appear to have significant changed over the past year.    The patient will continue aggressive use of the  lymph pump.  This will continue to improve the edema control and prevent sequela such as ulcers and infections.   The patient will follow-up with me on an annual basis.   4. Essential hypertension Continue antihypertensive medications as already ordered, these medications have been reviewed and there are no changes at this time.  5. Gastroesophageal reflux disease without esophagitis Continue PPI as already ordered, this medication has been reviewed and there are no changes at this time.  Avoidence of caffeine and alcohol  Moderate elevation of the head of the bed     Levora Dredge, MD  10/04/2023 4:49 PM

## 2023-10-05 ENCOUNTER — Encounter (INDEPENDENT_AMBULATORY_CARE_PROVIDER_SITE_OTHER): Payer: Self-pay | Admitting: Vascular Surgery

## 2023-10-05 ENCOUNTER — Ambulatory Visit (INDEPENDENT_AMBULATORY_CARE_PROVIDER_SITE_OTHER): Payer: BC Managed Care – PPO | Admitting: Vascular Surgery

## 2023-10-05 VITALS — BP 119/78 | HR 86 | Resp 16 | Wt 354.8 lb

## 2023-10-05 DIAGNOSIS — I10 Essential (primary) hypertension: Secondary | ICD-10-CM | POA: Diagnosis not present

## 2023-10-05 DIAGNOSIS — I890 Lymphedema, not elsewhere classified: Secondary | ICD-10-CM

## 2023-10-05 DIAGNOSIS — I872 Venous insufficiency (chronic) (peripheral): Secondary | ICD-10-CM | POA: Diagnosis not present

## 2023-10-05 DIAGNOSIS — I82511 Chronic embolism and thrombosis of right femoral vein: Secondary | ICD-10-CM | POA: Diagnosis not present

## 2023-10-05 DIAGNOSIS — K219 Gastro-esophageal reflux disease without esophagitis: Secondary | ICD-10-CM

## 2023-10-26 ENCOUNTER — Encounter: Payer: Self-pay | Admitting: Psychiatry

## 2023-10-26 ENCOUNTER — Ambulatory Visit (INDEPENDENT_AMBULATORY_CARE_PROVIDER_SITE_OTHER): Payer: BC Managed Care – PPO | Admitting: Psychiatry

## 2023-10-26 VITALS — BP 132/70 | HR 89 | Temp 97.7°F | Ht 66.0 in | Wt 357.2 lb

## 2023-10-26 DIAGNOSIS — F3342 Major depressive disorder, recurrent, in full remission: Secondary | ICD-10-CM

## 2023-10-26 DIAGNOSIS — G4701 Insomnia due to medical condition: Secondary | ICD-10-CM

## 2023-10-26 DIAGNOSIS — F411 Generalized anxiety disorder: Secondary | ICD-10-CM

## 2023-10-26 NOTE — Progress Notes (Unsigned)
BH MD OP Progress Note  10/26/2023 8:56 AM Evelyn Flores  MRN:  [id number]  Chief Complaint:  Chief Complaint  Patient presents with   Follow-up   Depression   Anxiety   Medication Refill   HPI: Evelyn Flores is a 37 year old Caucasian female, employed, lives at [facility], has a history of insomnia, MDD, GAD was evaluated in office today.  Patient today reports she is currently on the low dosage of sertraline.  Sertraline was lowered since her symptoms have been stable for a long time.  Patient reports for a week after starting the lower dosage she had irritability.  She however reports that got better after that.  She denies any significant sadness, anxiety symptoms at this time.  Patient reports sleep at night is good.  She continues to use the CPAP.  She however feels drowsy and tired throughout the day.  Patient is on multiple medications including Lyrica which likely also contributing to her tiredness during the day.  Patient to discuss with her provider.  Patient reports work is going well.  Patient denies any suicidality, homicidality or perceptual disturbances.  Patient continues to struggle with her weight.  She currently does not have a dietitian/nutritionist.  Patient reports she is currently trying to exercise regularly.  She is also motivated to start watching her diet.  Patient denies any other concerns today.  Visit Diagnosis:    ICD-10-CM   1. MDD (major depressive disorder), recurrent, in full remission (HCC)  F33.42     2. GAD (generalized anxiety disorder)  F41.1     3. 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.01    Mood, sleep apnea      Past Psychiatric History: I have reviewed past psychiatric history from progress note on 03/18/2018.  Past Medical History:  Past Medical History:  Diagnosis Date   Anxiety    Depression    DVT (deep venous thrombosis) (HCC)    Ear infection     Past Surgical History:  Procedure Laterality Date   CHOLECYSTECTOMY      VEIN SURGERY Bilateral    vein surgery both legs    Family Psychiatric History: I have reviewed family psychiatric history from progress note on 03/18/2018.  Family History:  Family History  Problem Relation Age of Onset   Panic disorder Mother    Dementia Paternal Grandfather     Social History: I have reviewed social history from progress note on 03/18/2018. Social History   Socioeconomic History   Marital status: Single    Spouse name: Not on file   Number of children: 0   Years of education: Not on file   Highest education level: High school graduate  Occupational History   Occupation: sport endeavors    Comment: fulltime  Tobacco Use   Smoking status: Never   Smokeless tobacco: Never  Vaping Use   Vaping status: Never Used  Substance and Sexual Activity   Alcohol use: No    Alcohol/week: 0.0 standard drinks of alcohol   Drug use: No   Sexual activity: Yes    Birth control/protection: Injection, Condom  Other Topics Concern   Not on file  Social History Narrative   Not on file   Social Determinants of Health   Financial Resource Strain: Low Risk  (04/22/2022)   [facility]   Overall Financial Resource Strain (CARDIA)    Difficulty of Paying Living Expenses: Not hard at all  Food Insecurity: No  Food Insecurity (09/23/2023)   Received from [facility]   Hunger Vital Sign    Worried About Running Out of Food in the Last Year: Never true    Ran Out of Food in the Last Year: Never true  Transportation Needs: No Transportation Needs (09/23/2023)   Received from [facility]    Lack of Transportation (Medical): No    Lack of Transportation (Non-Medical): No  Physical Activity: Insufficiently Active (11/10/2017)   Exercise Vital Sign    Days of Exercise per Week: 3 days    Minutes of Exercise per Session: 30 min  Stress: Stress Concern Present (11/10/2017)   [employer] of Occupational  Health - Occupational Stress Questionnaire    Feeling of Stress : Very much  Social Connections: Socially Isolated (11/10/2017)   Social Connection and Isolation Panel [NHANES]    Frequency of Communication with Friends and Family: Once a week    Frequency of Social Gatherings with Friends and Family: Once a week    Attends Religious Services: Never    Database administrator or Organizations: No    Attends Banker Meetings: Never    Marital Status: Never married    Allergies:  Allergies  Allergen Reactions   Misc. Sulfonamide Containing Compounds Other (See Comments) and Hives   Sulfa Antibiotics Swelling, Other (See Comments) and Hives   Buspirone Other (See Comments)    spasms spasms spasms   Influenza Vaccines Other (See Comments)    Very sick and was hospitalized    Topiramate Other (See Comments)    Mental fogginess, tingling, numbness    Metabolic Disorder Labs: No results found for: "HGBA1C", "MPG" No results found for: "PROLACTIN" No results found for: "CHOL", "TRIG", "HDL", "CHOLHDL", "VLDL", "LDLCALC" No results found for: "TSH"  Therapeutic Level Labs: No results found for: "LITHIUM" No results found for: "VALPROATE" No results found for: "CBMZ"  Current Medications: Current Outpatient Medications  Medication Sig Dispense Refill   dicyclomine (BENTYL) 20 MG tablet Take 1 tablet (20 mg total) by mouth 4 (four) times daily -  before meals and at bedtime. 20 tablet 0   ibuprofen (ADVIL) 600 MG tablet Take 1 tablet (600 mg total) by mouth every 6 (six) hours as needed. 30 tablet 0   ipratropium (ATROVENT) 0.06 % nasal spray Place 2 sprays into both nostrils 4 (four) times daily. 15 mL 12   magnesium oxide (MAG-OX) 400 MG tablet Take by mouth.     medroxyPROGESTERone (DEPO-PROVERA) 150 MG/ML injection Inject 150 mg into the muscle every 3 (three) months. Last injection June 2015     omeprazole (PRILOSEC) 40 MG capsule Take 40 mg by mouth daily.      ondansetron (ZOFRAN-ODT) 4 MG disintegrating tablet Take 1 tablet (4 mg total) by mouth every 6 (six) hours as needed for nausea or vomiting. 15 tablet 0   pregabalin (LYRICA) 100 MG capsule Take 1 capsule by mouth 2 (two) times daily.     rizatriptan (MAXALT-MLT) 10 MG disintegrating tablet Take by mouth.     sertraline (ZOLOFT) 100 MG tablet Take 1.5 tablets (150 mg total) by mouth daily. DOSE CHANGE 135 tablet 1   triamterene-hydrochlorothiazide (MAXZIDE-25) 37.5-25 MG tablet Take 2 tablets by mouth daily.  6   potassium chloride SA (KLOR-CON M) 20 MEQ tablet Take 1 tablet (20 mEq total) by mouth 2 (two) times daily for 5 days. 10 tablet 0   No current facility-administered medications for  this visit.     Musculoskeletal: Strength & Muscle Tone: within normal limits Gait & Station: normal Patient leans: N/A  Psychiatric Specialty Exam: Review of Systems  Psychiatric/Behavioral: Negative.      Blood pressure 132/70, pulse 89, temperature 97.7 F (36.5 C), temperature source Skin, height 5\' 6"  (1.676 m), weight (!) 357 lb 3.2 oz (162 kg).Body mass index is 57.65 kg/m.  General Appearance: Fairly Groomed  Eye Contact:  Good  Speech:  Clear and Coherent  Volume:  Normal  Mood:  Euthymic  Affect:  Congruent  Thought Process:  Goal Directed and Descriptions of Associations: Intact  Orientation:  Full (Time, Place, and Person)  Thought Content: Logical   Suicidal Thoughts:  No  Homicidal Thoughts:  No  Memory:  Immediate;   Fair Recent;   Fair Remote;   Fair  Judgement:  Fair  Insight:  Fair  Psychomotor Activity:  Normal  Concentration:  Concentration: Fair and Attention Span: Fair  Recall:  [employer] of Knowledge: Fair  Language: Fair  Akathisia:  No  Handed:  Right  AIMS (if indicated): not done  Assets:  Communication Skills Desire for Improvement Housing Social Support  ADL's:  Intact  Cognition: WNL  Sleep:  Fair   Screenings: AIMS    Flowsheet Row Office  Visit from 04/07/2022 in [facility]  AIMS Total Score 0      GAD-7    Flowsheet Row Office Visit from 10/26/2023 in [facility] Office Visit from 12/24/2022 in [facility] Office Visit from 08/22/2022 in [facility] Office Visit from 12/26/2021 in [facility] Video Visit from 10/14/2021 in [facility]  Total GAD-7 Score 2 1 2  0 1      PHQ2-9    Flowsheet Row Office Visit from 10/26/2023 in [facility] Video Visit from 05/19/2023 in [facility] Office Visit from 12/24/2022 in [facility] Office Visit from 08/22/2022 in [facility] Office Visit from 04/07/2022 in [facility]  PHQ-2 Total Score 0 0 1 1 0  PHQ-9 Total Score -- 0 -- 4 1      Flowsheet Row Office Visit from 10/26/2023 in [facility] ED from 07/11/2023 in [facility]  Video Visit from 05/19/2023 in [facility]  C-SSRS RISK CATEGORY No Risk No Risk No Risk        Assessment and Plan: Evelyn Flores is a 37 year old Caucasian female, single, employed, lives in [place] , has a history of MDD, GAD, obstructive sleep apnea on CPAP, chronic pain was evaluated in office today.  Patient is currently stable with regards to her mood symptoms on the current medication regimen.  Plan as noted below.  Plan MDD in remission Sertraline 150 mg p.o. daily-reduced dosage  GAD-stable Sertraline 150 mg p.o. daily-reduced dosage.  Insomnia-stable Continue CPAP for OSA Patient to check with her provider  regarding her fatigue/tiredness during the day likely multifactorial including being on higher dosages of Lyrica as well as being overweight and other factors.  Reviewed and discussed most recent labs including TSH-dated 09/23/2023-within normal limits.   Follow-up with [facility] in 5 months or sooner if needed. Collaboration of Care: Collaboration of Care: Other patient  encouraged to follow up with her provider regarding her excessive fatigue/sleepiness during the day.  Patient/Guardian was advised Release of Information must be obtained prior to any record release in order to collaborate their care with an outside provider. Patient/Guardian was advised if they have not already done so to contact the registration department to sign all necessary forms in order for [place] to release information regarding their care.   Consent: Patient/Guardian gives verbal consent for treatment and assignment of benefits for services provided during this visit. Patient/Guardian expressed understanding and agreed to proceed.   This note was generated in part or whole with voice recognition software. Voice recognition is usually quite accurate but there are transcription errors that can and very often do occur. I apologize for any typographical errors that were not detected and corrected.     Jomarie Longs, MD 10/27/2023, 12:28 PM

## 2023-11-17 ENCOUNTER — Other Ambulatory Visit: Payer: Self-pay | Admitting: Psychiatry

## 2023-11-17 DIAGNOSIS — F411 Generalized anxiety disorder: Secondary | ICD-10-CM

## 2023-11-18 ENCOUNTER — Telehealth: Payer: Self-pay

## 2023-11-18 NOTE — Telephone Encounter (Signed)
received fax requesting a refill on the sertraline. pt was last seen on 11-4 next appt 3-31

## 2023-11-18 NOTE — Telephone Encounter (Signed)
Sertraline was approved for refill and sent to pharmacy.

## 2023-11-23 NOTE — Telephone Encounter (Signed)
pt was told that i wanted to make sure that she got her medication. that dr. Elna Breslow sent message late on wednesday and office was closed thurs and friday and just go message today. and wanted to make sure she got. she states that she hasn't picked it up yet

## 2024-03-21 ENCOUNTER — Ambulatory Visit: Payer: BC Managed Care – PPO | Admitting: Psychiatry

## 2024-03-23 ENCOUNTER — Encounter: Payer: Self-pay | Admitting: Psychiatry

## 2024-03-23 ENCOUNTER — Ambulatory Visit (INDEPENDENT_AMBULATORY_CARE_PROVIDER_SITE_OTHER): Payer: Self-pay | Admitting: Psychiatry

## 2024-03-23 ENCOUNTER — Other Ambulatory Visit: Payer: Self-pay

## 2024-03-23 VITALS — BP 118/73 | HR 82 | Temp 97.1°F | Ht 66.0 in | Wt 362.8 lb

## 2024-03-23 DIAGNOSIS — F3342 Major depressive disorder, recurrent, in full remission: Secondary | ICD-10-CM | POA: Diagnosis not present

## 2024-03-23 DIAGNOSIS — F411 Generalized anxiety disorder: Secondary | ICD-10-CM | POA: Diagnosis not present

## 2024-03-23 DIAGNOSIS — G4701 Insomnia due to medical condition: Secondary | ICD-10-CM | POA: Diagnosis not present

## 2024-03-23 NOTE — Progress Notes (Unsigned)
 BH MD OP Progress Note  03/23/2024 9:33 AM Evelyn Flores  MRN:  [id number]  Chief Complaint:  Chief Complaint  Patient presents with   Follow-up   Depression   Anxiety   Medication Refill   HPI: Evelyn Flores is a 38 year old Caucasian female, employed, lives at [facility], has a history of MDD, GAD, insomnia, was evaluated in office today.  She has a history of major depression and generalized anxiety. Her depression was in remission during her last appointment in November, and her anxiety is currently well-managed. No current depressive symptoms are reported.  She experiences ongoing fatigue despite using CPAP for her sleep apnea. Insomnia is being managed with CPAP therapy, but fatigue persists.  She experiences occasional migraines that cause pain, for which she has medication to help alleviate the symptoms. She used to experience leg pain, but it has decreased unless her legs swell significantly.  She is currently employed but is seeking a new job due to dissatisfaction with her current position. 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 She is concerned about her weight, noting a recent increase. She has attempted to manage her weight through diet but admits that stress from work sometimes leads to lapses. She has previously tried weight management programs like Weight Watchers without success and has been unable to obtain insurance coverage for medications like Ozempic.  She is currently on sertraline.  Denies side effects.  Denies any suicidality, homicidality or perceptual disturbances.  Visit Diagnosis:    ICD-10-CM   1. MDD (major depressive disorder), recurrent, in full remission (HCC)  F33.42     2. GAD (generalized anxiety disorder)  F41.1     3. 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.01    Mood, sleep apnea      Past Psychiatric History: I have reviewed past psychiatric  history from progress note on 03/18/2018.  Past Medical History:  Past Medical History:  Diagnosis Date   Anxiety    Depression    DVT (deep venous thrombosis) (HCC)    Ear infection     Past Surgical History:  Procedure Laterality Date   CHOLECYSTECTOMY     VEIN SURGERY Bilateral    vein surgery both legs    Family Psychiatric History: I have reviewed family psychiatric history from progress note on 03/18/2018.  Family History:  Family History  Problem Relation Age of Onset   Panic disorder Mother    Dementia Paternal Grandfather     Social History: I have reviewed social history from progress note on 03/18/2018. Social History   Socioeconomic History   Marital status: Single    Spouse name: Not on file   Number of children: 0   Years of education: Not on file   Highest education level: High school graduate  Occupational History   Occupation: sport endeavors    Comment: fulltime  Tobacco Use   Smoking status: Never   Smokeless tobacco: Never  Vaping Use   Vaping status: Never Used  Substance and Sexual Activity   Alcohol use: No    Alcohol/week: 0.0 standard drinks of alcohol   Drug use: No   Sexual activity: Yes    Birth control/protection: Injection, Condom  Other Topics Concern   Not on file  Social History Narrative   Not on file   Social Drivers of Health   Financial Resource Strain: Low Risk  (04/22/2022)   [facility]  Care, [facility]   Overall Financial Resource Strain (CARDIA)    Difficulty of Paying Living Expenses: Not hard at all  Food Insecurity: No Food Insecurity (01/19/2024)   Received from [facility]   Hunger Vital Sign    Worried About Running Out of Food in the Last Year: Never true    Ran Out of Food in the Last Year: Never true  Transportation Needs: No Transportation Needs (01/19/2024)   Received from [facility] - [facility]    Lack of Transportation (Medical): No    Lack of Transportation  (Non-Medical): No  Physical Activity: Insufficiently Active (11/10/2017)   Exercise Vital Sign    Days of Exercise per Week: 3 days    Minutes of Exercise per Session: 30 min  Stress: Stress Concern Present (11/10/2017)   [employer] of Occupational Health - Occupational Stress Questionnaire    Feeling of Stress : Very much  Social Connections: Socially Isolated (11/10/2017)   Social Connection and Isolation Panel [NHANES]    Frequency of Communication with Friends and Family: Once a week    Frequency of Social Gatherings with Friends and Family: Once a week    Attends Religious Services: Never    Database administrator or Organizations: No    Attends Banker Meetings: Never    Marital Status: Never married    Allergies:  Allergies  Allergen Reactions   Misc. Sulfonamide Containing Compounds Other (See Comments) and Hives   Sulfa Antibiotics Swelling, Other (See Comments) and Hives   Buspirone Other (See Comments)    spasms spasms spasms   Influenza Vaccines Other (See Comments)    Very sick and was hospitalized    Topiramate Other (See Comments)    Mental fogginess, tingling, numbness    Metabolic Disorder Labs: No results found for: "HGBA1C", "MPG" No results found for: "PROLACTIN" No results found for: "CHOL", "TRIG", "HDL", "CHOLHDL", "VLDL", "LDLCALC" No results found for: "TSH"  Therapeutic Level Labs: No results found for: "LITHIUM" No results found for: "VALPROATE" No results found for: "CBMZ"  Current Medications: Current Outpatient Medications  Medication Sig Dispense Refill   ibuprofen (ADVIL) 600 MG tablet Take 1 tablet (600 mg total) by mouth every 6 (six) hours as needed. 30 tablet 0   ipratropium (ATROVENT) 0.06 % nasal spray Place 2 sprays into both nostrils 4 (four) times daily. 15 mL 12   magnesium oxide (MAG-OX) 400 MG tablet Take by mouth.     medroxyPROGESTERone (DEPO-PROVERA) 150 MG/ML injection Inject 150 mg into the muscle  every 3 (three) months. Last injection June 2015     omeprazole (PRILOSEC) 40 MG capsule Take 40 mg by mouth daily.     ondansetron (ZOFRAN-ODT) 4 MG disintegrating tablet Take 1 tablet (4 mg total) by mouth every 6 (six) hours as needed for nausea or vomiting. 15 tablet 0   pregabalin (LYRICA) 100 MG capsule Take 1 capsule by mouth 2 (two) times daily.     rizatriptan (MAXALT-MLT) 10 MG disintegrating tablet Take by mouth.     sertraline (ZOLOFT) 100 MG tablet TAKE 1.5 TABLETS DAILY 135 tablet 1   triamterene-hydrochlorothiazide (MAXZIDE-25) 37.5-25 MG tablet Take 2 tablets by mouth daily.  6   No current facility-administered medications for this visit.     Musculoskeletal: Strength & Muscle Tone: within normal limits Gait & Station: normal Patient leans: N/A  Psychiatric Specialty Exam: Review of Systems  Psychiatric/Behavioral: Negative.      Blood  pressure 118/73, pulse 82, temperature (!) 97.1 F (36.2 C), temperature source Skin, height 5\' 6"  (1.676 m), weight (!) 362 lb 12.8 oz (164.6 kg).Body mass index is 58.56 kg/m.  General Appearance: Casual  Eye Contact:  Good  Speech:  Clear and Coherent  Volume:  Normal  Mood:  Euthymic  Affect:  Congruent  Thought Process:  Goal Directed and Descriptions of Associations: Intact  Orientation:  Full (Time, Place, and Person)  Thought Content: Logical   Suicidal Thoughts:  No  Homicidal Thoughts:  No  Memory:  Immediate;   Fair Recent;   Fair Remote;   Fair  Judgement:  Intact  Insight:  Fair  Psychomotor Activity:  Normal  Concentration:  Concentration: Fair and Attention Span: Fair  Recall:  [employer] of Knowledge: Fair  Language: Fair  Akathisia:  No  Handed:  Right  AIMS (if indicated): not done  Assets:  Desire for Improvement Housing Social Support Transportation  ADL's:  Intact  Cognition: WNL  Sleep:  Fair   Screenings: Geneticist, molecular Office Visit from 04/07/2022 in [facility]  AIMS Total Score 0      GAD-7    Flowsheet Row Office Visit from 03/23/2024 in [facility] Office Visit from 10/26/2023 in [facility] Office Visit from 12/24/2022 in [facility] Office Visit from 08/22/2022 in [facility] Office Visit from 12/26/2021 in [facility]  Total GAD-7 Score 1 2 1 2  0      PHQ2-9    Flowsheet Row Office Visit from 03/23/2024 in [facility] Office Visit from 10/26/2023 in [facility] Video Visit from 05/19/2023 in [facility] Office Visit from 12/24/2022 in [facility] Office Visit from 08/22/2022 in [facility]  PHQ-2 Total Score 0 0 0 1 1  PHQ-9 Total Score -- -- 0 -- 4      Flowsheet Row Office Visit from 03/23/2024 in [facility] Office Visit from 10/26/2023 in [facility] ED from 07/11/2023 in [facility]   C-SSRS RISK CATEGORY No Risk No Risk No Risk        Assessment and Plan: Evelyn Flores is a 38 year old Caucasian female, single, employed, lives in [place], has a history of MDD, GAD, obstructive sleep apnea on CPAP, chronic pain was evaluated in office today.  Patient is currently stable with regards to mood symptoms.  Discussed assessment and plan as noted below.  Major Depressive Disorder in Remission Currently in remission with no signs of depression. Well-managed with sertraline. - Continue Sertraline 150 mg daily - Follow up in 5 months unless symptoms change  Generalized Anxiety Disorder Well-managed with sertraline, no  significant anxiety symptoms reported. - Continue Sertraline 150 mg daily  Insomnia-stable Managed with CPAP therapy. Reports fatigue despite CPAP use, suggesting further evaluation of fatigue causes. Vitamin D and thyroid function were normal in October. - Check B12 levels at the primary care provider - Continue CPAP therapy  Follow-up -  Follow-up in clinic in 3 to 4 months or sooner if needed.  - Consider referral to a weight loss clinic -  Explore lifestyle medicine or wellness clinic options -  Encourage physical activity  and dietary management   Consent: Patient/Guardian gives verbal consent for treatment and assignment of benefits for services provided during this visit. Patient/Guardian expressed understanding and agreed to proceed.  This note was generated in part or whole with voice recognition software. Voice recognition is usually quite accurate but there are transcription errors that can and very often do occur. I apologize for any typographical errors that were not detected and corrected.   Discussed the use of a AI scribe software for clinical note transcription with the patient, who gave verbal consent to proceed.    Jomarie Longs, MD 03/24/2024, 8:16 AM

## 2024-03-23 NOTE — Patient Instructions (Signed)
 Preventing Unhealthy Weight Gain, Adult  Staying at a healthy weight is important to your overall health. When fat builds up in your body, you may become overweight or obese. Being overweight or obese increases your risk of developing various health problems.  Unhealthy weight gain is often the result of making unhealthy food choices or not getting enough exercise. You can make changes to your lifestyle to prevent obesity and stay as healthy as possible.  How can unhealthy weight gain affect me?  Being overweight or obese can cause you to develop joint or bone problems, which can make it hard for you to stay active or do activities you enjoy. Being overweight also puts stress on your heart and lungs and can lead to health problems such as:  Diabetes.  Heart disease.  Some types of cancer.  Stroke.  Eating healthy, staying active, and having healthy habits can help to prevent unhealthy weight gain and lower your risk for health problems. These lifestyle changes will also help you manage stress and emotions, improve your self-esteem, and connect with friends and family.  What can increase my risk?  In addition to certain eating and lifestyle choices, some other factors that may make you more likely to have unhealthy weight gain include:  Having a family history of obesity.  Living in an area with limited access to:  [place], recreation centers, or sidewalks.  Healthy food choices, such as grocery stores and farmers' markets.  What actions can I take to prevent unhealthy weight gain?  Nutrition    Eat only as much as your body needs. To do this:  Pay attention to signs that you are hungry or full. Stop eating as soon as you feel full.  If you feel hungry, try drinking water first before eating. Drink enough water so your urine is pale yellow.  Eat smaller portions. Pay attention to portion sizes when eating out.  Look at serving sizes on food labels. Most foods contain more than one serving per container.  Eat the  recommended number of calories for your gender and activity level. For most active people, a daily total of 2,000 calories is appropriate. If you are trying to lose weight or are not very active, you may need to eat fewer calories. Talk with your health care provider or a dietitian about how many calories you need each day.  Choose healthy foods, such as:  Fruits and vegetables. At each meal, try to fill at least half of your plate with fruits and vegetables.  Whole grains, such as whole-wheat bread, brown rice, and quinoa.  Lean meats, such as chicken or fish.  Other healthy proteins, such as beans, eggs, or tofu.  Healthy fats, such as nuts, seeds, fatty fish, and olive oil.  Low-fat or fat-free dairy products.  Check food labels, and avoid food and drinks that:  Are high in calories.  Have added sugar.  Are high in sodium.  Have saturated fats or trans fats.  Cook foods in healthier ways, such as by baking, broiling, or grilling.  Make a meal plan for the week, and shop with a grocery list to help you stay on track with your purchases. Try to avoid going to the grocery store when you are hungry.  When grocery shopping, try to shop around the outside of the store first, where the fresh foods are. Doing this helps you avoid prepackaged foods, which can be high in sugar, salt (sodium), and fat.  Lifestyle    Exercise  for 30 or more minutes on 5 or more days each week. Exercising may include brisk walking, yard work, biking, running, swimming, and team sports like basketball and soccer. Ask your health care provider which exercises are safe for you.  Do activities that strengthen the muscles, such as lifting weights or using resistance bands, on 2 or more days a week.  Do not use any products that contain nicotine or tobacco. These products include cigarettes, chewing tobacco, and vaping devices, such as e-cigarettes. If you need help quitting, ask your health care provider.  If you drink alcohol:  Limit how much you  have to:  0-1 drink a day for women who are not pregnant.  0-2 drinks a day for men.  Know how much alcohol is in a drink. In the [place] one drink equals one 12 oz bottle of beer (355 mL), one 5 oz glass of wine (148 mL), or one 1 oz glass of hard liquor (44 mL).  Try to get 7-9 hours of sleep each night.  Other changes  Keep a food and activity journal to keep track of:  What you ate and how many calories you had. Remember to count the calories in sauces, dressings, and side dishes.  Whether you were active, and what exercises you did.  Your calorie, weight, and activity goals.  Check your weight regularly. Track any changes. If you notice that you have gained weight, make changes to your diet or activity routine.  Avoid taking weight-loss medicines or supplements. Talk to your health care provider before starting any new medicine or supplement.  Talk to your health care provider before trying any new diet or exercise plan.  Where to find more information  Talk with your health care provider or a dietitian about healthy eating and healthy lifestyle choices. You may also find information from:  [place] Department of Agriculture, MyPlate: https://ball-collins.biz/  American Heart Association: www.heart.org  Centers for Disease Control and Prevention: FootballExhibition.com.br  Summary  Eating healthy, staying active, and having healthy habits can help to prevent unhealthy weight gain and lower your risk for health problems such as heart disease, diabetes, some types of cancer, and stroke.  Being overweight or obese can cause you to develop joint or bone problems, which can make it hard for you to stay active or do activities you enjoy.  You can prevent unhealthy weight gain by eating a healthy diet, exercising regularly, not smoking, limiting alcohol, and getting enough sleep.  Talk with your health care provider or a dietitian for guidance about healthy eating and healthy lifestyle choices.  This information is not intended to replace  advice given to you by your health care provider. Make sure you discuss any questions you have with your health care provider.  Document Revised: 07/05/2021 Document Reviewed: 07/05/2021  Elsevier Patient Education  2024 [employer].

## 2024-05-10 ENCOUNTER — Encounter (INDEPENDENT_AMBULATORY_CARE_PROVIDER_SITE_OTHER): Payer: Self-pay

## 2024-05-17 ENCOUNTER — Other Ambulatory Visit: Payer: Self-pay | Admitting: Psychiatry

## 2024-05-17 DIAGNOSIS — F411 Generalized anxiety disorder: Secondary | ICD-10-CM

## 2024-08-05 ENCOUNTER — Other Ambulatory Visit: Payer: Self-pay | Admitting: Obstetrics and Gynecology

## 2024-08-05 DIAGNOSIS — Z1231 Encounter for screening mammogram for malignant neoplasm of breast: Secondary | ICD-10-CM

## 2024-08-24 ENCOUNTER — Encounter: Payer: Self-pay | Admitting: Psychiatry

## 2024-08-24 ENCOUNTER — Ambulatory Visit (INDEPENDENT_AMBULATORY_CARE_PROVIDER_SITE_OTHER): Admitting: Psychiatry

## 2024-08-24 VITALS — BP 124/80 | HR 90 | Temp 98.3°F | Ht 66.0 in | Wt 359.2 lb

## 2024-08-24 DIAGNOSIS — F3342 Major depressive disorder, recurrent, in full remission: Secondary | ICD-10-CM | POA: Diagnosis not present

## 2024-08-24 DIAGNOSIS — F411 Generalized anxiety disorder: Secondary | ICD-10-CM | POA: Diagnosis not present

## 2024-08-24 DIAGNOSIS — G4701 Insomnia due to medical condition: Secondary | ICD-10-CM | POA: Diagnosis not present
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
## 2024-09-14 ENCOUNTER — Ambulatory Visit

## 2024-10-03 ENCOUNTER — Encounter (INDEPENDENT_AMBULATORY_CARE_PROVIDER_SITE_OTHER): Payer: Self-pay | Admitting: Vascular Surgery

## 2024-10-03 ENCOUNTER — Ambulatory Visit (INDEPENDENT_AMBULATORY_CARE_PROVIDER_SITE_OTHER): Payer: BC Managed Care – PPO | Admitting: Vascular Surgery

## 2024-10-03 VITALS — BP 119/82 | HR 85 | Resp 18 | Wt 358.0 lb

## 2024-10-03 DIAGNOSIS — I10 Essential (primary) hypertension: Secondary | ICD-10-CM | POA: Diagnosis not present

## 2024-10-03 DIAGNOSIS — I890 Lymphedema, not elsewhere classified: Secondary | ICD-10-CM | POA: Diagnosis not present

## 2024-10-03 DIAGNOSIS — K219 Gastro-esophageal reflux disease without esophagitis: Secondary | ICD-10-CM | POA: Diagnosis not present

## 2024-10-03 DIAGNOSIS — I872 Venous insufficiency (chronic) (peripheral): Secondary | ICD-10-CM | POA: Diagnosis not present

## 2024-10-03 NOTE — Progress Notes (Signed)
 MRN : [id number]  Evelyn Flores is a 38 y.o. (Oct 18, 1986) female who presents with chief complaint of legs hurt and swell.  History of Present Illness:   The patient returns to the office for followup evaluation regarding leg swelling.  The swelling has improved quite a bit and the pain associated with swelling has decreased substantially. There have not been any interval development of a ulcerations or wounds.   Since the previous visit the patient has been wearing graduated compression stockings and has noted some improvement in the lymphedema. The patient has been using compression routinely morning until night.  She also has a lymphedema pump which is working well and she uses it frequently.   The patient also states elevation during the day and exercise (such as walking) is being done too.   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  Previous DVT is not detected.  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  This is an improvement from the previous studies.  Current Meds  Medication Sig   ibuprofen  (ADVIL ) 600 MG tablet Take 1 tablet (600 mg total) by mouth every 6 (six) hours as needed.   ipratropium (ATROVENT ) 0.06 % nasal spray Place 2 sprays into both nostrils 4 (four) times daily.   magnesium oxide (MAG-OX) 400 MG tablet Take by mouth.   medroxyPROGESTERone  Acetate 150 MG/ML SUSY Inject 150 mg into the muscle.   omeprazole (PRILOSEC) 40 MG capsule Take 40 mg by mouth daily.   potassium chloride  (KLOR-CON  M) 10 MEQ tablet TAKE 1 TABLET BY MOUTH ONCE DAILY FOR 7 DAYS   pregabalin (LYRICA) 100 MG capsule Take 1 capsule by mouth 2 (two) times daily.   rizatriptan (MAXALT-MLT) 10 MG disintegrating tablet Take by mouth.   sertraline  (ZOLOFT ) 100 MG tablet TAKE 1.5 TABLETS DAILY   triamterene-hydrochlorothiazide (MAXZIDE-25) 37.5-25 MG tablet Take 2 tablets by mouth daily.    Past Medical  History:  Diagnosis Date   Anxiety    Depression    DVT (deep venous thrombosis) (HCC)    Ear infection     Past Surgical History:  Procedure Laterality Date   CHOLECYSTECTOMY     VEIN SURGERY Bilateral    vein surgery both legs    Social History Social History   Tobacco Use   Smoking status: Never   Smokeless tobacco: Never  Vaping Use   Vaping status: Never Used  Substance Use Topics   Alcohol use: No    Alcohol/week: 0.0 standard drinks of alcohol   Drug use: No    Family History Family History  Problem Relation Age of Onset   Panic disorder Mother    Dementia Paternal Grandfather     Allergies  Allergen Reactions   Misc. Sulfonamide Containing Compounds Other (See Comments) and Hives   Sulfa Antibiotics Swelling, Other (See Comments) and Hives   Buspirone  Other (See Comments)    spasms spasms spasms   Influenza Vaccines Other (See Comments)    Very sick and was hospitalized    Topiramate Other (See Comments)    Mental fogginess, tingling, numbness     REVIEW OF SYSTEMS (Negative unless checked)  Constitutional: [] Weight loss  [] Fever  [] Chills Cardiac: [] Chest pain   [] Chest pressure   [] Palpitations   [] Shortness of breath when laying flat   [] Shortness of breath with exertion. Vascular:  [] Pain in legs with walking   [x] Pain in legs  at rest  [] History of DVT   [] Phlebitis   [x] Swelling in legs   [] Varicose veins   [] Non-healing ulcers Pulmonary:   [] Uses home oxygen   [] Productive cough   [] Hemoptysis   [] Wheeze  [] COPD   [] Asthma Neurologic:  [] Dizziness   [] Seizures   [] History of stroke   [] History of TIA  [] Aphasia   [] Vissual changes   [] Weakness or numbness in arm   [] Weakness or numbness in leg Musculoskeletal:   [] Joint swelling   [x] Joint pain   [] Low back pain Hematologic:  [] Easy bruising  [] Easy bleeding   [] Hypercoagulable state   [] Anemic Gastrointestinal:  [] Diarrhea   [] Vomiting  [x] Gastroesophageal reflux/heartburn   [] Difficulty  swallowing. Genitourinary:  [] Chronic kidney disease   [] Difficult urination  [] Frequent urination   [] Blood in urine Skin:  [] Rashes   [] Ulcers  Psychological:  [] History of anxiety   [x]  History of major depression.  Physical Examination  Vitals:   10/03/24 0907  BP: 119/82  Pulse: 85  Resp: 18  Weight: (!) 358 lb (162.4 kg)   Body mass index is 57.78 kg/m. Gen: WD/WN, NAD Head: [place]/AT, No temporalis wasting.  Ear/Nose/Throat: Hearing grossly intact, nares w/o erythema or drainage, pinna without lesions Eyes: PER, EOMI, sclera nonicteric.  Neck: Supple, no gross masses.  No JVD.  Pulmonary:  Good air movement, no audible wheezing, no use of accessory muscles.  Cardiac: RRR, precordium not hyperdynamic. Vascular:  scattered varicosities present bilaterally.  Moderate venous stasis changes to the legs bilaterally.  2+ soft pitting edema. CEAP [id number]   Vessel Right Left  Radial Palpable Palpable  Gastrointestinal: soft, non-distended. No guarding/no peritoneal signs.  Musculoskeletal: M/S 5/5 throughout.  No deformity.  Neurologic: CN 2-12 intact. Pain and light touch intact in extremities.  Symmetrical.  Speech is fluent. Motor exam as listed above. Psychiatric: Judgment intact, Mood & affect appropriate for pt's clinical situation. Dermatologic: Venous rashes no ulcers noted.  No changes consistent with cellulitis. Lymph : No lichenification or skin changes of chronic lymphedema.  CBC Lab Results  Component Value Date   WBC 8.1 07/11/2023   HGB 13.2 07/11/2023   HCT 38.6 07/11/2023   MCV 80.2 07/11/2023   PLT 247 07/11/2023    BMET    Component Value Date/Time   NA 139 07/11/2023 1329   NA 140 12/15/2014 2220   K 3.2 (L) 07/11/2023 1329   K 3.6 12/15/2014 2220   CL 104 07/11/2023 1329   CL 106 12/15/2014 2220   CO2 24 07/11/2023 1329   CO2 29 12/15/2014 2220   GLUCOSE 103 (H) 07/11/2023 1329   GLUCOSE 85 12/15/2014 2220   BUN 24 (H) 07/11/2023 1329   BUN 12  12/15/2014 2220   CREATININE 1.47 (H) 07/11/2023 1329   CREATININE 0.94 12/15/2014 2220   CALCIUM 9.4 07/11/2023 1329   CALCIUM 9.0 12/15/2014 2220   GFRNONAA 46 (L) 07/11/2023 1329   GFRNONAA >60 12/15/2014 2220   GFRAA >60 01/26/2019 2159   GFRAA >60 12/15/2014 2220   CrCl cannot be calculated (Patient's most recent lab result is older than the maximum 21 days allowed.).  COAG No results found for: INR, PROTIME  Radiology No results found.   Assessment/Plan 1. Lymphedema (Primary) Recommend:  No surgery or intervention at this point in time.    I have reviewed my discussion with the patient regarding lymphedema and why it  causes symptoms.  Patient will continue wearing graduated compression on a daily basis. The patient should put  the compression on first thing in the morning and removing them in the evening. The patient should not sleep in the compression.   In addition, behavioral modification throughout the day will be continued.  This will include frequent elevation (such as in a recliner), use of over the counter pain medications as needed and exercise such as walking.  The systemic causes for chronic edema such as liver, kidney and cardiac etiologies does not appear to have significant changed over the past year.    The patient will continue aggressive use of the  lymph pump.  This will continue to improve the edema control and prevent sequela such as ulcers and infections.   The patient will follow-up with me on an annual basis.   2. Venous insufficiency Recommend:  No surgery or intervention at this point in time.    I have reviewed my discussion with the patient regarding lymphedema and why it  causes symptoms.  Patient will continue wearing graduated compression on a daily basis. The patient should put the compression on first thing in the morning and removing them in the evening. The patient should not sleep in the compression.   In addition, behavioral  modification throughout the day will be continued.  This will include frequent elevation (such as in a recliner), use of over the counter pain medications as needed and exercise such as walking.  The systemic causes for chronic edema such as liver, kidney and cardiac etiologies does not appear to have significant changed over the past year.    The patient will continue aggressive use of the  lymph pump.  This will continue to improve the edema control and prevent sequela such as ulcers and infections.   The patient will follow-up with me on an annual basis.   3. Essential hypertension Continue antihypertensive medications as already ordered, these medications have been reviewed and there are no changes at this time.  4. Gastroesophageal reflux disease without esophagitis Continue PPI as already ordered, this medication has been reviewed and there are no changes at this time.  Avoidence of caffeine and alcohol  Moderate elevation of the head of the bed     Cordella Shawl, MD  10/03/2024 9:26 AM

## 2024-10-10 ENCOUNTER — Encounter (INDEPENDENT_AMBULATORY_CARE_PROVIDER_SITE_OTHER): Payer: Self-pay | Admitting: Vascular Surgery

## 2024-10-13 ENCOUNTER — Ambulatory Visit
Admission: RE | Admit: 2024-10-13 | Discharge: 2024-10-13 | Disposition: A | Discharge status: Home / Self Care | Source: Ambulatory Visit | Attending: Obstetrics and Gynecology | Admitting: Obstetrics and Gynecology

## 2024-10-13 DIAGNOSIS — Z1231 Encounter for screening mammogram for malignant neoplasm of breast: Secondary | ICD-10-CM | POA: Diagnosis present

## 2024-11-15 ENCOUNTER — Other Ambulatory Visit: Payer: Self-pay | Admitting: Psychiatry

## 2024-11-15 DIAGNOSIS — F411 Generalized anxiety disorder: Secondary | ICD-10-CM

## 2025-02-22 ENCOUNTER — Telehealth: Admitting: Psychiatry

## 2025-10-02 ENCOUNTER — Ambulatory Visit (INDEPENDENT_AMBULATORY_CARE_PROVIDER_SITE_OTHER): Admitting: Vascular Surgery
